# Patient Record
Sex: Female | Born: 1945
Health system: Southern US, Community
[De-identification: ages and names within clinical notes are randomized; demographics above are authoritative.]

## PROBLEM LIST (undated history)

## (undated) DIAGNOSIS — K219 Gastro-esophageal reflux disease without esophagitis: Secondary | ICD-10-CM

## (undated) DIAGNOSIS — E079 Disorder of thyroid, unspecified: Secondary | ICD-10-CM

## (undated) DIAGNOSIS — H269 Unspecified cataract: Secondary | ICD-10-CM

## (undated) DIAGNOSIS — D126 Benign neoplasm of colon, unspecified: Secondary | ICD-10-CM

## (undated) DIAGNOSIS — C801 Malignant (primary) neoplasm, unspecified: Secondary | ICD-10-CM

## (undated) DIAGNOSIS — I10 Essential (primary) hypertension: Secondary | ICD-10-CM

## (undated) DIAGNOSIS — R519 Headache, unspecified: Secondary | ICD-10-CM

## (undated) DIAGNOSIS — G4730 Sleep apnea, unspecified: Secondary | ICD-10-CM

## (undated) DIAGNOSIS — T7840XA Allergy, unspecified, initial encounter: Secondary | ICD-10-CM

## (undated) DIAGNOSIS — IMO0002 Reserved for concepts with insufficient information to code with codable children: Secondary | ICD-10-CM

## (undated) DIAGNOSIS — D219 Benign neoplasm of connective and other soft tissue, unspecified: Secondary | ICD-10-CM

## (undated) DIAGNOSIS — M1990 Unspecified osteoarthritis, unspecified site: Secondary | ICD-10-CM

## (undated) DIAGNOSIS — M79609 Pain in unspecified limb: Secondary | ICD-10-CM

## (undated) DIAGNOSIS — A048 Other specified bacterial intestinal infections: Secondary | ICD-10-CM

## (undated) DIAGNOSIS — K449 Diaphragmatic hernia without obstruction or gangrene: Secondary | ICD-10-CM

## (undated) DIAGNOSIS — R011 Cardiac murmur, unspecified: Secondary | ICD-10-CM

## (undated) DIAGNOSIS — I8390 Asymptomatic varicose veins of unspecified lower extremity: Secondary | ICD-10-CM

## (undated) DIAGNOSIS — F419 Anxiety disorder, unspecified: Secondary | ICD-10-CM

## (undated) DIAGNOSIS — R51 Headache: Secondary | ICD-10-CM

## (undated) DIAGNOSIS — G4733 Obstructive sleep apnea (adult) (pediatric): Secondary | ICD-10-CM

## (undated) HISTORY — DX: Anxiety disorder, unspecified: F41.9

## (undated) HISTORY — PX: UPPER GASTROINTESTINAL ENDOSCOPY: SHX188

## (undated) HISTORY — DX: Reserved for concepts with insufficient information to code with codable children: IMO0002

## (undated) HISTORY — DX: Benign neoplasm of connective and other soft tissue, unspecified: D21.9

## (undated) HISTORY — DX: Headache, unspecified: R51.9

## (undated) HISTORY — DX: Benign neoplasm of colon, unspecified: D12.6

## (undated) HISTORY — DX: Unspecified cataract: H26.9

## (undated) HISTORY — DX: Asymptomatic varicose veins of unspecified lower extremity: I83.90

## (undated) HISTORY — DX: Gastro-esophageal reflux disease without esophagitis: K21.9

## (undated) HISTORY — PX: ESOPHAGOGASTRODUODENOSCOPY: SHX1529

## (undated) HISTORY — DX: Disorder of thyroid, unspecified: E07.9

## (undated) HISTORY — DX: Essential (primary) hypertension: I10

## (undated) HISTORY — DX: Other specified bacterial intestinal infections: A04.8

## (undated) HISTORY — DX: Allergy, unspecified, initial encounter: T78.40XA

## (undated) HISTORY — DX: Headache: R51

## (undated) HISTORY — DX: Unspecified osteoarthritis, unspecified site: M19.90

## (undated) HISTORY — DX: Cardiac murmur, unspecified: R01.1

## (undated) HISTORY — PX: BREAST SURGERY: SHX581

## (undated) HISTORY — DX: Sleep apnea, unspecified: G47.30

## (undated) HISTORY — DX: Obstructive sleep apnea (adult) (pediatric): G47.33

## (undated) HISTORY — PX: COLONOSCOPY: SHX174

## (undated) HISTORY — DX: Malignant (primary) neoplasm, unspecified: C80.1

## (undated) HISTORY — DX: Diaphragmatic hernia without obstruction or gangrene: K44.9

## (undated) HISTORY — DX: Pain in unspecified limb: M79.609

## (undated) HISTORY — PX: TONSILECTOMY, ADENOIDECTOMY, BILATERAL MYRINGOTOMY AND TUBES: SHX2538

---

## 1998-04-12 ENCOUNTER — Other Ambulatory Visit: Admission: RE | Admit: 1998-04-12 | Discharge: 1998-04-12 | Payer: Self-pay | Admitting: *Deleted

## 1998-04-19 ENCOUNTER — Ambulatory Visit (HOSPITAL_COMMUNITY): Admission: RE | Admit: 1998-04-19 | Discharge: 1998-04-19 | Payer: Self-pay | Admitting: *Deleted

## 1998-05-16 ENCOUNTER — Ambulatory Visit (HOSPITAL_COMMUNITY): Admission: RE | Admit: 1998-05-16 | Discharge: 1998-05-16 | Payer: Self-pay | Admitting: *Deleted

## 1998-05-23 ENCOUNTER — Ambulatory Visit (HOSPITAL_COMMUNITY): Admission: RE | Admit: 1998-05-23 | Discharge: 1998-05-23 | Payer: Self-pay | Admitting: *Deleted

## 1999-08-21 ENCOUNTER — Other Ambulatory Visit: Admission: RE | Admit: 1999-08-21 | Discharge: 1999-08-21 | Payer: Self-pay | Admitting: *Deleted

## 1999-08-23 ENCOUNTER — Ambulatory Visit (HOSPITAL_COMMUNITY): Admission: RE | Admit: 1999-08-23 | Discharge: 1999-08-23 | Payer: Self-pay | Admitting: *Deleted

## 2001-02-10 ENCOUNTER — Encounter: Payer: Self-pay | Admitting: Emergency Medicine

## 2001-02-10 ENCOUNTER — Emergency Department (HOSPITAL_COMMUNITY): Admission: EM | Admit: 2001-02-10 | Discharge: 2001-02-10 | Payer: Self-pay | Admitting: Emergency Medicine

## 2001-05-12 ENCOUNTER — Encounter: Payer: Self-pay | Admitting: Endocrinology

## 2001-05-12 ENCOUNTER — Encounter (INDEPENDENT_AMBULATORY_CARE_PROVIDER_SITE_OTHER): Payer: Self-pay | Admitting: Specialist

## 2001-05-12 ENCOUNTER — Ambulatory Visit (HOSPITAL_COMMUNITY): Admission: RE | Admit: 2001-05-12 | Discharge: 2001-05-12 | Payer: Self-pay | Admitting: Endocrinology

## 2001-06-23 ENCOUNTER — Other Ambulatory Visit: Admission: RE | Admit: 2001-06-23 | Discharge: 2001-06-23 | Payer: Self-pay | Admitting: *Deleted

## 2001-07-21 ENCOUNTER — Ambulatory Visit (HOSPITAL_COMMUNITY): Admission: RE | Admit: 2001-07-21 | Discharge: 2001-07-21 | Payer: Self-pay | Admitting: *Deleted

## 2002-05-04 ENCOUNTER — Encounter: Payer: Self-pay | Admitting: Endocrinology

## 2002-05-04 ENCOUNTER — Ambulatory Visit (HOSPITAL_COMMUNITY): Admission: RE | Admit: 2002-05-04 | Discharge: 2002-05-04 | Payer: Self-pay | Admitting: Endocrinology

## 2002-08-10 ENCOUNTER — Other Ambulatory Visit: Admission: RE | Admit: 2002-08-10 | Discharge: 2002-08-10 | Payer: Self-pay | Admitting: *Deleted

## 2004-06-20 ENCOUNTER — Encounter: Admission: RE | Admit: 2004-06-20 | Discharge: 2004-06-20 | Payer: Self-pay | Admitting: Internal Medicine

## 2004-10-29 ENCOUNTER — Encounter: Admission: RE | Admit: 2004-10-29 | Discharge: 2004-10-29 | Payer: Self-pay | Admitting: Neurosurgery

## 2006-10-28 ENCOUNTER — Ambulatory Visit (HOSPITAL_COMMUNITY): Admission: RE | Admit: 2006-10-28 | Discharge: 2006-10-28 | Payer: Self-pay | Admitting: Endocrinology

## 2006-11-18 ENCOUNTER — Encounter (INDEPENDENT_AMBULATORY_CARE_PROVIDER_SITE_OTHER): Payer: Self-pay | Admitting: Specialist

## 2006-11-18 ENCOUNTER — Ambulatory Visit (HOSPITAL_COMMUNITY): Admission: RE | Admit: 2006-11-18 | Discharge: 2006-11-18 | Payer: Self-pay | Admitting: Endocrinology

## 2006-12-30 ENCOUNTER — Encounter (INDEPENDENT_AMBULATORY_CARE_PROVIDER_SITE_OTHER): Payer: Self-pay | Admitting: Specialist

## 2006-12-30 ENCOUNTER — Ambulatory Visit (HOSPITAL_COMMUNITY): Admission: RE | Admit: 2006-12-30 | Discharge: 2006-12-30 | Payer: Self-pay | Admitting: *Deleted

## 2007-03-24 ENCOUNTER — Ambulatory Visit (HOSPITAL_COMMUNITY): Admission: RE | Admit: 2007-03-24 | Discharge: 2007-03-24 | Payer: Self-pay | Admitting: *Deleted

## 2008-11-15 ENCOUNTER — Other Ambulatory Visit: Admission: RE | Admit: 2008-11-15 | Discharge: 2008-11-15 | Payer: Self-pay | Admitting: Obstetrics and Gynecology

## 2009-08-22 ENCOUNTER — Encounter: Admission: RE | Admit: 2009-08-22 | Discharge: 2009-08-22 | Payer: Self-pay | Admitting: Family Medicine

## 2010-07-03 ENCOUNTER — Ambulatory Visit: Payer: Self-pay | Admitting: Vascular Surgery

## 2010-10-16 ENCOUNTER — Ambulatory Visit: Payer: Self-pay | Admitting: Vascular Surgery

## 2010-12-17 ENCOUNTER — Encounter: Payer: Self-pay | Admitting: Endocrinology

## 2011-01-09 ENCOUNTER — Other Ambulatory Visit: Payer: Self-pay | Admitting: Gastroenterology

## 2011-01-09 DIAGNOSIS — R112 Nausea with vomiting, unspecified: Secondary | ICD-10-CM

## 2011-01-22 ENCOUNTER — Ambulatory Visit
Admission: RE | Admit: 2011-01-22 | Discharge: 2011-01-22 | Disposition: A | Discharge status: Home / Self Care | Payer: BC Managed Care – PPO | Source: Ambulatory Visit | Attending: Gastroenterology | Admitting: Gastroenterology

## 2011-01-22 DIAGNOSIS — R112 Nausea with vomiting, unspecified: Secondary | ICD-10-CM

## 2011-04-10 NOTE — Consult Note (Signed)
NEW PATIENT OCTOBER, PEERY N  DOB:  Apr 09, 1946                                       07/03/2010  VHQIO#:[id number]   Patient is a 65 year old female with venous insufficiency of both lower  extremities, right worse than left.  She has been suffering from  increasing discomfort, particularly in the distal thigh and posterior  popliteal area, right worse than left.  She is a hair stylist and stands  all day.  These symptoms become worse as the day progresses.  She has no  history of DVT or thrombophlebitis.  She has tried to wear elastic  stockings in the past but has never been able to find any that sit well.  Elevation of the legs does help her symptoms, but she is unable to do  that during her long work day.  She does take pain medication without  much success.  She also has bad arthritis of her knee and may require  knee surgery in the future.  She has no history of stasis ulcers,  bleeding, or other complications but does have increasing swelling in  both ankles as the day progresses.   CHRONIC MEDICAL PROBLEMS:  1. Hypothyroidism.  2. Allergic rhinitis.  3. Mild mitral regurgitation.  4. GERD.  5. History of uterine fibroids.  6. Negative for coronary artery disease, diabetes, COPD, or stroke.   SOCIAL HISTORY:  The patient is single.  She has 1 child.  She is a hair  stylist.  Does not use tobacco or alcohol.   FAMILY HISTORY:  Negative for coronary artery disease, diabetes, or  stroke.   REVIEW OF SYSTEMS:  Does have discomfort in both legs due to arthritis  in her knees and has a history of headaches, reflux esophagitis.  No  chest pain, dyspnea on exertion, productive cough, bronchitis.  All  other systems are negative in review of systems.   PHYSICAL EXAMINATION:  Blood pressure 140/70, heart rate 72,  respirations 24.  Generally, she is a well-developed, well-nourished  female in no apparent stress.  Alert and oriented x3.   HEENT:  Normal  for age.  EOMs intact.  Lungs are clear to auscultation.  No rhonchi or  wheezing.  Cardiovascular:  Regular rhythm.  No murmurs.  Abdomen:  Soft, nontender with no masses.  Carotid pulses 3+.  No audible bruits.  Musculoskeletal exam is free of major deformities.  Neurologic:  Normal.  Skin is free of rashes.  Lower extremity exam reveals 3+ femoral,  popliteal, and dorsalis pedis pulses, palpable bilaterally.  She has 1+  edema bilaterally.  She has diffuse spider veins in both legs in the  distal thigh and medial calf, anterior thigh, and ankle areas.  She has  bulging varicosities in the right leg over the great saphenous system of  the medial thigh and medial calf area.  She has less varicosities on the  left side.  Popliteal fossa has a large area of reticular and spider  veins bilaterally.   Today I ordered a venous duplex exam of both lower extremities, which I  reviewed and interpreted.  Deep systems are normal bilaterally.  Right  great saphenous vein has gross reflux throughout, including the  saphenofemoral junction and the right small saphenous vein also has  reflux.  The left small saphenous vein has reflux, but  the great  saphenous vein is competent in the thigh with some reflux in the calf.   I think this patient's symptoms of burning, itching, throbbing and  swelling are coming from her superficial venous insufficiency secondary  to valvular incompetence.  We will treat her with long-leg elastic  compression stockings (20 mm - 30 mm gradient) as well as elevation and  ibuprofen for 3 months.  When she returns, if there has been no  improvement, I think she should have laser ablation of the right great  saphenous vein followed by laser ablation of the right small saphenous  vein to relieve her symptoms on that side.  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     Quita Skye. Hart Rochester, M.D.  Electronically Signed    JDL/MEDQ  D:  07/03/2010  T:  07/03/2010  Job:  [id number]   cc:   Caryn Bee L. Little, M.D.  Dr. Altamese Dilling

## 2011-04-10 NOTE — Procedures (Signed)
LOWER EXTREMITY VENOUS REFLUX EXAM   INDICATION:  Pain in the legs, varicose veins.   EXAM:  Using color-flow imaging and pulse Doppler spectral analysis, the  bilateral common femoral, superficial femoral, popliteal, posterior  tibial, greater and lesser saphenous veins are evaluated.  There is  evidence suggesting deep venous insufficiency in the left lower  extremity.   The right saphenofemoral junction is not competent with Reflux of  >538milliseconds. The bilateral GSV's are not competent with Reflux of  >560milliseconds with the caliber as described below.  Reflux noted in  the left greater saphenous vein only below the knee.   The bilateral proximal short saphenous veins demonstrate competency.   GSV Diameter (used if found to be incompetent only)                                            Right         Left  Proximal Greater Saphenous Vein           0.65 cm       cm  Proximal-to-mid-thigh                     cm            cm  Mid thigh                                 0.54 cm       cm  Mid-distal thigh                          cm            cm  Distal thigh                              0.41 cm       cm  Knee                                      0.40 cm       cm   IMPRESSION:  1. The right greater saphenous vein is identified with the caliber      ranging from 0.40 cm to 0.72 cm knee to groin.  2. The bilateral greater saphenous veins are not aneurysmal.  3. The bilateral greater saphenous veins are not tortuous.  4. The deep venous system is not competent with Reflux of      >518milliseconds in the left leg.  5. The bilateral lesser saphenous veins are not competent with Reflux      of >519milliseconds.  6. There is an nonvascularized structure noted behind the right knee      measuring 1.79 cm X 1.54 cm.   ___________________________________________  Quita Skye. Hart Rochester, M.D.   CB/MEDQ  D:  07/03/2010  T:  07/03/2010  Job:  [id number]

## 2011-04-10 NOTE — Assessment & Plan Note (Signed)
OFFICE VISIT   Sherry Bell, Sherry Bell  DOB:  11-13-1946                                       10/16/2010  UJWJX#:[id number]   The patient returns today for continued followup regarding her venous  insufficiency of both lower extremities, right worse than left.  She  continues to have aching, throbbing and burning discomfort in both legs  which worsens as the day progresses.  She is a Scientist, research (medical) and stands  all day and her symptoms are becoming intolerable.  She has no history  of DVT or thrombophlebitis.  She has tried wearing elastic compression  stockings as much as possible and has had no improvement in her symptoms  despite wearing long leg (20 mm-30 mm gradient) stockings.  She cannot  elevate her legs at work but does try ibuprofen.   She does have documented reflux in the right great saphenous vein from  the saphenofemoral junction to the knee and in both small saphenous  veins from the mid calf to the saphenopopliteal junction.   On exam she does have multiple reticular and spider veins throughout  both legs, particularly in the posterior popliteal area and down in the  medial ankle area.  There are some bulging varicosities in the right  distal thigh.  She does have one to two plus edema bilaterally, right  worse than left.   I think she should have the following procedures performed:  1) laser  ablation of the right great saphenous vein, 2) laser ablation of the  right small saphenous vein and 3) laser ablation of the left small  saphenous vein.  We will proceed with precertification to perform this  in the near future.     Quita Skye Hart Rochester, M.D.  Electronically Signed   JDL/MEDQ  D:  10/16/2010  T:  10/16/2010  Job:  [id number]

## 2011-04-13 NOTE — Op Note (Signed)
NAMELIZZET, HENDLEY               ACCOUNT NO.:  [id number]   MEDICAL RECORD NO.:  [id number]          PATIENT TYPE:  AMB   LOCATION:  ENDO                         FACILITY:  [facility]   PHYSICIAN:  Georgiana Spinner, M.D.    DATE OF BIRTH:  01/15/1946   DATE OF PROCEDURE:  12/30/2006  DATE OF DISCHARGE:                               OPERATIVE REPORT   PROCEDURE:  Upper endoscopy.   INDICATIONS:  Abdominal pain, hemoccult positivity.   ANESTHESIA:  Demerol 60 mg, Versed 6 mg.   PROCEDURE:  With the patient mildly sedated in the left lateral  decubitus position, the Pentax videoscopic endoscope was inserted in the  mouth, passed under direct vision through the esophagus which showed a  slight area of esophagitis with a linear ulcer noted, photographed and  biopsied.  We entered into the stomach.  The fundus, body, antrum,  duodenal bulb, second portion duodenum were visualized.  From this point  the endoscope was slowly withdrawn taking circumferential views of  duodenal mucosa until the endoscope had been pulled back into the  stomach, placed in retroflexion to view the stomach from below.  The  endoscope was straightened and withdrawn taking circumferential views of  the remaining gastric and esophageal mucosa stopping in the fundus of  the stomach where a snake skinning appearance was noted and was  biopsied.  The endoscope was withdrawn.  The patient's vital signs,  pulse oximeter remained stable.  The patient tolerated procedure well  without apparent complications.   FINDINGS:  A snake skinning of fundus of stomach and some mild degree of  esophagitis with a short linear ulcer seen.   PLAN:  Await biopsy report.  The patient will call me for results and  follow-up with me as an outpatient.  Proceed to colonoscopy as planned.           ______________________________  Georgiana Spinner, M.D.     GMO/MEDQ  D:  12/30/2006  T:  12/30/2006  Job:  [id number]   cc:   Caryn Bee L. Little,  M.D.

## 2011-04-13 NOTE — Op Note (Signed)
NAMELACHE, DAGHER               ACCOUNT NO.:  [id number]   MEDICAL RECORD NO.:  [id number]          PATIENT TYPE:  AMB   LOCATION:  ENDO                         FACILITY:  [facility]   PHYSICIAN:  Georgiana Spinner, M.D.    DATE OF BIRTH:  June 25, 1946   DATE OF PROCEDURE:  12/30/2006  DATE OF DISCHARGE:                               OPERATIVE REPORT   SURGEON:  Georgiana Spinner, M.D.   PROCEDURE:  Colonoscopy.   INDICATIONS:  Hemoccult positivity.   ANESTHESIA:  Demerol 40 mg, Versed 4 mg, Phenergan 6.25 mg.   DESCRIPTION OF PROCEDURE:  With the patient mildly sedated in the left  lateral decubitus position, a rectal exam was performed, which revealed  trace positive material.  Subsequently, the Pentax videoscopic  colonoscope was inserted into the rectum and passed under direct vision  to the cecum, identified by the ileocecal valve and appendiceal orifice,  both of which were photographed.  From this point, the colonoscope was  slowly withdrawn, taking circumferential views of colonic mucosa,  stopping only in the rectum, which appeared normal, with the rectum on  retroflex view.  The endoscope was straightened and withdrawn.  The  patient's vital signs and pulse oximetry remained stable.  The patient  tolerated the procedure well and without apparent complications.   FINDINGS:  Negative examination.   PLAN:  See endoscopy note for further details.           ______________________________  Georgiana Spinner, M.D.     GMO/MEDQ  D:  12/30/2006  T:  12/30/2006  Job:  [id number]   cc:   Caryn Bee L. Little, M.D.

## 2011-07-09 ENCOUNTER — Encounter: Payer: Self-pay | Admitting: Vascular Surgery

## 2011-07-12 ENCOUNTER — Encounter: Payer: Self-pay | Admitting: Vascular Surgery

## 2011-07-23 ENCOUNTER — Other Ambulatory Visit: Payer: BC Managed Care – PPO | Admitting: Vascular Surgery

## 2011-07-31 ENCOUNTER — Ambulatory Visit: Payer: BC Managed Care – PPO | Admitting: Vascular Surgery

## 2011-08-03 ENCOUNTER — Encounter: Payer: Self-pay | Admitting: Vascular Surgery

## 2011-08-06 ENCOUNTER — Ambulatory Visit (INDEPENDENT_AMBULATORY_CARE_PROVIDER_SITE_OTHER): Payer: BC Managed Care – PPO | Admitting: Vascular Surgery

## 2011-08-06 VITALS — BP 180/87 | HR 72 | Resp 24 | Ht 63.5 in | Wt 200.0 lb

## 2011-08-06 DIAGNOSIS — I83893 Varicose veins of bilateral lower extremities with other complications: Secondary | ICD-10-CM

## 2011-08-06 NOTE — Progress Notes (Deleted)
Subjective:     Patient ID: Sherry Bell, female   DOB: [birth year], 65 y.o.   MRN: [id number]  HPI   Review of Systems     Objective:   Physical Exam     Assessment:     ***    Plan:     ***

## 2011-08-06 NOTE — Progress Notes (Deleted)
Subjective:     Patient ID: Sherry Bell, female   DOB: 1946-09-09, 65 y.o.   MRN: [id number]  HPI  Laser Ablation Procedure      Date: 08/06/2011    Sherry Bell DOB:March 14, 1946  Consent signed: yes  Surgeon:{VVS DOCTORS:[id number]}  Procedure: Laser Ablation: right greater Saphenous Vein  BP 180/87  Pulse 72  Resp 24  Ht 5' 3.5" (1.613 m)  Wt 200 lb (90.719 kg)  BMI 34.87 kg/m2  Start time: 9:10   End time: 10:00  Tumescent Anesthesia: 8 cc 0.9% NaCl with 50 cc Lidocaine HCL with 1% Epi and 15 cc 8.4% NaHCO3  Local Anesthesia: 275 cc Lidocaine HCL and NaHCO3 (ratio 2:1)  15 watt/1 sec/1 pulse      Patient tolerated procedure well: {yes/no:20286}   Description of Procedure:  After marking the course of the saphenous vein and the secondary varicosities in the standing position, the patient was placed on the operating table in the {DESC; PRONE / SUPINE / LATERAL:19389} position, and  was prepped and draped in sterile fashion. Local anesthetic was administered, and under ultrasound guidance the saphenous vein was accessed with a micro needle and guide wire; then the micro puncture sheath was placed. A guide wire was inserted to the right SFJ junction, followed by a 5 french sheath.  The position of the sheath and then the laser fiber below the junction was confirmed using the ultrasound and visualization of the aiming beam.  Tumescent anesthesia was administered along the course of the saphenous vein using ultrasound guidance. Protective laser glasses were placed on the patient, and the laser was fired at Pulsed mode: 15 watts, 1 sec., for 57 secs.  for a total of 866 joules.  A steri strip was applied to the puncture site.  The patient was then put into Trendelenburg position.  Local anesthetic was utilized overlying the marked varicosities.  Greater than *** stab wounds were made using the tip of an 11 blade; and using the vein hook,  The phlebectomies were performed using a  hemostat to avulse these varicosities.  Adequate hemostasis was achieved, and steri strips were applied to the stab wound.      ABD pads and thigh high compression stockings were applied.  The patient ambulated out of the operating room having tolerated the procedure well.    Review of Systems     Objective:   Physical Exam     Assessment:     ***    Plan:     ***

## 2011-08-06 NOTE — Progress Notes (Signed)
Subjective:     Patient ID: Sherry Bell, female   DOB: 09-17-1946, 65 y.o.   MRN: [id number]  HPI this 65 year old female underwent laser ablation of the right great saphenous sign under local tumescent anesthesia. She tolerated the procedure well. She had documented reflux in the right great saphenous system from the distal thigh to the saphenofemoral junction due to a valvular incompetence. This was causing pain and swelling in the right leg. She will return in one week for a venous duplex exam to confirm closure of the right great saphenous vein. She will then be scheduled for similar procedures in both small saphenous veins which also have gross reflux.   Review of Systems     Objective:   Physical Exam blood pressure today 180/87 heart rate 70 respirations 20    Assessment:     Well-tolerated laser ablation of right great saphenous system and local tumescent anesthesia    Plan:     Return in one week for venous duplex exam to confirm closure.

## 2011-08-07 ENCOUNTER — Telehealth: Payer: Self-pay | Admitting: *Deleted

## 2011-08-07 NOTE — Telephone Encounter (Signed)
Called patient morning after her procedure: R GSV laser ablation. Patient is doing well, no problems, following all instructions. Will see her next Tuesday for fu lab study and Dr. Visit. She will call if she has any problems or concerns.

## 2011-08-13 ENCOUNTER — Encounter: Payer: Self-pay | Admitting: Vascular Surgery

## 2011-08-14 ENCOUNTER — Ambulatory Visit (INDEPENDENT_AMBULATORY_CARE_PROVIDER_SITE_OTHER): Payer: BC Managed Care – PPO | Admitting: Vascular Surgery

## 2011-08-14 ENCOUNTER — Encounter: Payer: Self-pay | Admitting: Vascular Surgery

## 2011-08-14 ENCOUNTER — Encounter (INDEPENDENT_AMBULATORY_CARE_PROVIDER_SITE_OTHER): Payer: BC Managed Care – PPO | Admitting: *Deleted

## 2011-08-14 VITALS — BP 173/77 | HR 83 | Resp 20 | Ht 65.5 in | Wt 200.0 lb

## 2011-08-14 DIAGNOSIS — I83893 Varicose veins of bilateral lower extremities with other complications: Secondary | ICD-10-CM

## 2011-08-14 DIAGNOSIS — Z48812 Encounter for surgical aftercare following surgery on the circulatory system: Secondary | ICD-10-CM

## 2011-08-14 NOTE — Progress Notes (Signed)
One week fu laser.

## 2011-08-14 NOTE — Progress Notes (Signed)
Subjective:     Patient ID: Sherry Bell, female   DOB: [birth year], 65 y.o.   MRN: [id number]  HPI this 65 year old female returns today for initial followup regarding her laser ablation of the right great saphenous vein performed one week ago. She had some burning discomfort along the anterior aspect of the right lower leg which lasted a few days and then resolved she has had no progression of her edema. She denies any severe discomfort along the course of the ablation in the right medial thigh. She has been wearing her elastic compression stocking and taking ibuprofen as prescribed.  Past Medical History  Diagnosis Date  . Gastric reflux   . Arthritis   . Generalized headaches   . Pain in limb   . Thyroid disease   . Allergy   . Heart murmur     History  Substance Use Topics  . Smoking status: Never Smoker   . Smokeless tobacco: Not on file  . Alcohol Use: No    No family history on file.  Allergies  Allergen Reactions  . Codeine     No current outpatient prescriptions on file.  BP 173/77  Pulse 83  Resp 20  Ht 5' 5.5" (1.664 m)  Wt 200 lb (90.719 kg)  BMI 32.78 kg/m2  Body mass index is 32.78 kg/(m^2).        Review of Systems she denies chest pain dyspnea on exertion PND orthopnea hemoptysis or new claudication symptoms     Objective:   Physical Exam blood pressure 173/77 heart rate 83 respirations 20 General she is a well-developed well-nourished female in no apparent stress alert and oriented x3 Right lower extremity exam reveals 3+ femoral and dorsalis pedis pulse palpable. She has some mild discomfort along the course of the great saphenous vein from the mid thigh to the saphenofemoral junction. There are diffuse bilateral reticular veins on the lateral thigh lateral calf area. 1+ edema distally.  Today I ordered a venous duplex exam of the right leg which reveals no evidence of DVT and total closure of the right great saphenous vein.    Assessment:       Doing well post laser ablation right great saphenous I    Plan:     Laser ablation of right small saphenous spine scheduled for October 1

## 2011-08-22 NOTE — Procedures (Unsigned)
DUPLEX DEEP VENOUS EXAM - LOWER EXTREMITY  INDICATION:  One week followup right GSV ablation.  HISTORY:  Edema:  Yes. Trauma/Surgery:  EVLT. Pain:  No. PE:  No. Previous DVT:  No. Anticoagulants:  No. Other:  DUPLEX EXAM:               CFV   SFV   PopV  PTV    GSV               R  L  R  L  R  L  R   L  R  L Thrombosis    o     o     o     o      + Spontaneous   +     +     +     +      o Phasic        +     +     +     +      o Augmentation  +     +     +     +      o Compressible  +     +     +     +      o Competent  Legend:  + - yes  o - no  p - partial  D - decreased  IMPRESSION: 1. No evidence of deep venous thrombosis in the right lower extremity     status post GSV (great saphenous vein) ablation. 2. Right great saphenous vein appears successfully ablated from 1.14     cm distal to the junction through the mid thigh insertion point.    _____________________________ Quita Skye. Hart [place], M.D.  LT/MEDQ  D:  08/14/2011  T:  08/14/2011  Job:  [id number]

## 2011-08-24 ENCOUNTER — Encounter: Payer: Self-pay | Admitting: Vascular Surgery

## 2011-08-27 ENCOUNTER — Encounter: Payer: Self-pay | Admitting: Vascular Surgery

## 2011-08-27 ENCOUNTER — Ambulatory Visit (INDEPENDENT_AMBULATORY_CARE_PROVIDER_SITE_OTHER): Payer: BC Managed Care – PPO | Admitting: Vascular Surgery

## 2011-08-27 VITALS — BP 175/70 | HR 72 | Resp 20 | Ht 63.5 in | Wt 200.0 lb

## 2011-08-27 DIAGNOSIS — I83893 Varicose veins of bilateral lower extremities with other complications: Secondary | ICD-10-CM

## 2011-08-27 NOTE — Progress Notes (Signed)
Subjective:     Patient ID: Sherry Bell, female   DOB: 1946-09-16, 65 y.o.   MRN: [id number]  HPI this 65 year old female presented today for laser ablation of the right small saphenous vein area he continued to have aching throbbing and burning discomfort in both legs as the day progresses. She is a Scientist, research (medical) and stands all day. Previous reflux exam dated August 20 11th had revealed that she did have gross reflux in both small saphenous veins as well as the right great saphenous vein. She has had successful laser of elation of the right great saphenous vein. Prior to beginning later today I am it is both small saphenous veins with the SonoSite scanner. She does not appear to have any reflux and a small saphenous veins today on either the right or left side. The caliber of the veins was also diminished significantly since August of 2011. I do not recommend proceeding with laser ablation of the small saphenous vein and I discussed this with the patient.   Review of Systems blood pressure 175/70 heart rate 72 respirations 20     Objective:   Physical Exam     Assessment:     No obvious reflux in bilateral small saphenous veins at this time    Plan:    #1 short-leg elastic compression stockings #2 elevate legs when necessary #3 no further treatment indicated at this time

## 2011-08-27 NOTE — Progress Notes (Signed)
[id number] R SSV

## 2011-09-04 ENCOUNTER — Ambulatory Visit: Payer: BC Managed Care – PPO | Admitting: Vascular Surgery

## 2011-09-04 ENCOUNTER — Other Ambulatory Visit: Payer: BC Managed Care – PPO

## 2011-11-02 ENCOUNTER — Encounter: Payer: Self-pay | Admitting: Vascular Surgery

## 2014-01-16 ENCOUNTER — Ambulatory Visit (INDEPENDENT_AMBULATORY_CARE_PROVIDER_SITE_OTHER): Payer: Medicare Other | Admitting: Emergency Medicine

## 2014-01-16 VITALS — BP 144/76 | HR 77 | Temp 98.5°F | Resp 18 | Ht 62.5 in | Wt 221.0 lb

## 2014-01-16 DIAGNOSIS — R109 Unspecified abdominal pain: Secondary | ICD-10-CM

## 2014-01-16 LAB — POCT URINALYSIS DIPSTICK
Bilirubin, UA: NEGATIVE
Glucose, UA: NEGATIVE
Ketones, UA: NEGATIVE
Nitrite, UA: NEGATIVE
Protein, UA: NEGATIVE
Spec Grav, UA: 1.015
Urobilinogen, UA: 0.2
pH, UA: 8

## 2014-01-16 LAB — POCT UA - MICROSCOPIC ONLY
Bacteria, U Microscopic: NEGATIVE
Casts, Ur, LPF, POC: NEGATIVE
Crystals, Ur, HPF, POC: NEGATIVE
Mucus, UA: NEGATIVE
RBC, urine, microscopic: NEGATIVE
Yeast, UA: NEGATIVE

## 2014-01-16 NOTE — Progress Notes (Signed)
[facility] [address] [place] [address] [phone]  Date:  01/16/2014   Name:  Sherry Bell   DOB:  04-11-1946   MRN:  [id number]  PCP:  Gennette Pac, MD    Chief Complaint: Back Pain   History of Present Illness:  Sherry Bell is a 68 y.o. very pleasant female patient who presents with the following:  Says she has pain in right flank since Christmas.  Pain waxes and wanes and is often completely gone for days.  No dysuria, urgency or frequency.  No hematuria.  No nausea or vomiting no stool change.  No vaginal discharge or bleeding.  No dyspareunia.  Says the pain interferes with activities.  Feels as though she has a ball in her back.  No fever or chills.  No improvement with over the counter medications or other home remedies. Denies other complaint or health concern today.   There are no active problems to display for this patient.   Past Medical History  Diagnosis Date  . Gastric reflux   . Arthritis   . Generalized headaches   . Pain in limb   . Thyroid disease   . Allergy   . Heart murmur     Past Surgical History  Procedure Laterality Date  . Tonsilectomy, adenoidectomy, bilateral myringotomy and tubes      History  Substance Use Topics  . Smoking status: Never Smoker   . Smokeless tobacco: Not on file  . Alcohol Use: No    No family history on file.  Allergies  Allergen Reactions  . Codeine     Medication list has been reviewed and updated.  No current outpatient prescriptions on file prior to visit.   No current facility-administered medications on file prior to visit.    Review of Systems:  As per HPI, otherwise negative.    Physical Examination: Filed Vitals:   01/16/14 1745  BP: 144/76  Pulse: 77  Temp: 98.5 F (36.9 C)  Resp: 18   Filed Vitals:   01/16/14 1745  Height: 5' 2.5" (1.588 m)  Weight: 221 lb (100.245 kg)   Body mass index is 39.75 kg/(m^2). Ideal Body Weight: Weight in  (lb) to have BMI = 25: 138.6  GEN: WDWN, NAD, Non-toxic, A & O x 3 HEENT: Atraumatic, Normocephalic. Neck supple. No masses, No LAD. Ears and Nose: No external deformity. CV: RRR, No M/G/R. No JVD. No thrill. No extra heart sounds. PULM: CTA B, no wheezes, crackles, rhonchi. No retractions. No resp. distress. No accessory muscle use. ABD: S, NT, ND, +BS. No rebound. No HSM. EXTR: No c/c/e NEURO Normal gait.  PSYCH: Normally interactive. Conversant. Not depressed or anxious appearing.  Calm demeanor.    Assessment and Plan: Right flank pain sono  Signed,  Ellison Carwin, MD  Results for orders placed in visit on 01/16/14  POCT URINALYSIS DIPSTICK      Result Value Ref Range   Color, UA yellow     Clarity, UA clear     Glucose, UA neg     Bilirubin, UA neg     Ketones, UA neg     Spec Grav, UA 1.015     Blood, UA tr-intact     pH, UA 8.0     Protein, UA neg     Urobilinogen, UA 0.2     Nitrite, UA neg     Leukocytes, UA small (1+)    POCT UA - MICROSCOPIC ONLY  Result Value Ref Range   WBC, Ur, HPF, POC 0-1     RBC, urine, microscopic neg     Bacteria, U Microscopic neg     Mucus, UA neg     Epithelial cells, urine per micros 0-1     Crystals, Ur, HPF, POC neg     Casts, Ur, LPF, POC neg     Yeast, UA neg

## 2014-01-16 NOTE — Patient Instructions (Signed)

## 2014-01-19 ENCOUNTER — Ambulatory Visit
Admission: RE | Admit: 2014-01-19 | Discharge: 2014-01-19 | Disposition: A | Discharge status: Home / Self Care | Payer: Medicare Other | Source: Ambulatory Visit | Attending: Emergency Medicine | Admitting: Emergency Medicine

## 2014-01-19 DIAGNOSIS — R109 Unspecified abdominal pain: Secondary | ICD-10-CM

## 2014-01-20 NOTE — Addendum Note (Signed)
Addended by: Roselee Culver on: 01/20/2014 09:31 AM   Modules accepted: Orders

## 2014-03-03 ENCOUNTER — Other Ambulatory Visit: Payer: Self-pay | Admitting: Emergency Medicine

## 2014-03-03 ENCOUNTER — Telehealth: Payer: Self-pay

## 2014-03-03 DIAGNOSIS — N852 Hypertrophy of uterus: Secondary | ICD-10-CM

## 2014-03-03 NOTE — Telephone Encounter (Signed)
Pt had a CT abd/pelvis and the results came in through fax (will scan). Called pt and notified her that we are going to be referring her to GYN

## 2014-03-21 ENCOUNTER — Other Ambulatory Visit: Payer: Self-pay | Admitting: Emergency Medicine

## 2014-03-21 DIAGNOSIS — R1900 Intra-abdominal and pelvic swelling, mass and lump, unspecified site: Secondary | ICD-10-CM

## 2014-05-31 ENCOUNTER — Ambulatory Visit: Payer: Medicare Other | Attending: Gynecologic Oncology | Admitting: Gynecologic Oncology

## 2014-05-31 VITALS — BP 146/66 | HR 66 | Temp 98.6°F | Resp 20 | Ht 63.0 in | Wt 219.0 lb

## 2014-05-31 DIAGNOSIS — D259 Leiomyoma of uterus, unspecified: Secondary | ICD-10-CM

## 2014-05-31 DIAGNOSIS — Z780 Asymptomatic menopausal state: Secondary | ICD-10-CM

## 2014-05-31 NOTE — Patient Instructions (Signed)
Please call our office if you have any concerns. Follow up with Dr. Garwin [facility] office

## 2014-06-01 ENCOUNTER — Encounter: Payer: Self-pay | Admitting: Gynecologic Oncology

## 2014-06-01 DIAGNOSIS — D219 Benign neoplasm of connective and other soft tissue, unspecified: Secondary | ICD-10-CM | POA: Insufficient documentation

## 2014-06-01 NOTE — Progress Notes (Signed)
Consult Note: Gyn-Onc  Consult was requested by Dr. Garwin Brothers for the evaluation of Sherry Bell 68 y.o. female with postmenopausal growth in fibroids  CC:  Chief Complaint  Patient presents with  . Fibroids    Assessment/Plan:  Sherry Bell  is a 69 y.o.  year old woman with slowly growing fibroids in a postmenopausal setting. She seems to have had a 1cm growth in the posterior fibroid (from 5-6cm) over a 4 year period. These remain asymptomatic. Her exam findings are reassuring. Given that I have a low suspicion for malignancy based on their appearance I do not feel strongly that MRI need be performed at this time. I do feel that she should have ongoing surveillance (eg annual imaging with [place]). If they demonstrate >50% growth, I would feel that surgical removal (hysterectomy) would be indicated to rule out malignancy. However with subtle change in size over multiple years, in an asymptomatic patient, it is reasonable to assume close followup. We discussed potential concerning symptoms including pelvic pain or pressure, vaginal bleeding, abnormal discharge, change in bladder or bowel habit. She was informed to let Dr Garwin Brothers or myself know sooner if these develop, as they would also prompt additional workup and consideration for surgery.     HPI: Sherry Bell is a 68 year old woman who is seen in consultation at the request of Dr Garwin Brothers for enlarging uterine fibroids in the postmenopausal setting (her LMP was at age 62 and she has not ever used exogenous hormones postmenopausally). Sherry Bell is asymptomatic with no pelvic pain, pressure, vaginal bleeding or constipation. She does report occasional difficulty with initiating micturition which is felt to be a consequence of a large cystocele causing urethral kinking. However, this does not bother her and is not impairing her quality of life. She underwent US imaging of the uterus in March 2011 which showed 3 uterine fibroids, the largest in the  posterior myometrial wall measuring 5cm in largest diameter. On 05/04/14 she had a repeat US which showed subtle growth in the largest fibroid (now 6.3x4.7x5.4cm). She is not motivated to undergo hysterectomy as she is concerned about time off work and would prefer not to do this if not deemed absolutely necessary.   Interval History: She continues to have no symptoms from the fibroid including no vaginal bleeding.  Current Meds:  Outpatient Encounter Prescriptions as of 05/31/2014  Medication Sig  . levothyroxine (SYNTHROID, LEVOTHROID) 88 MCG tablet Take 88 mcg by mouth daily before breakfast.  . liothyronine (CYTOMEL) 25 MCG tablet   . losartan (COZAAR) 50 MG tablet Take 50 mg by mouth daily.  . Multiple Vitamin (MULTI VITAMIN DAILY PO) Take 1 each by mouth.  . [DISCONTINUED] azelastine (ASTELIN) 0.1 % nasal spray     Allergy:  Allergies  Allergen Reactions  . Codeine Nausea And Vomiting    Social Hx:   History   Social History  . Marital Status: Single    Spouse Name: N/A    Number of Children: N/A  . Years of Education: N/A   Occupational History  . Not on file.   Social History Main Topics  . Smoking status: Never Smoker   . Smokeless tobacco: Not on file  . Alcohol Use: No  . Drug Use: No  . Sexual Activity: No   Other Topics Concern  . Not on file   Social History Narrative  . No narrative on file    Past Surgical Hx:  Past Surgical History  Procedure Laterality  Date  . Tonsilectomy, adenoidectomy, bilateral myringotomy and tubes      Past Medical Hx:  Past Medical History  Diagnosis Date  . Gastric reflux   . Arthritis   . Generalized headaches   . Pain in limb   . Thyroid disease   . Allergy   . Heart murmur   . Cystocele   . Fibroids     Past Gynecological History:  No LMP recorded. Patient is postmenopausal.  Family Hx:  Family History  Problem Relation Age of Onset  . Cancer Father   . Cancer Mother     Review of  Systems:  Constitutional  Feels well,   ENT Normal appearing ears and nares bilaterally Skin/Breast  No rash, sores, jaundice, itching, dryness Cardiovascular  No chest pain, shortness of breath, or edema  Pulmonary  No cough or wheeze.  Gastro Intestinal  No nausea, vomitting, or diarrhoea. No bright red blood per rectum, no abdominal pain, change in bowel movement, or constipation.  Genito Urinary  No frequency, urgency, dysuria, no vaginal bleeding Musculo Skeletal  No myalgia, arthralgia, joint swelling or pain  Neurologic  No weakness, numbness, change in gait,  Psychology  No depression, anxiety, insomnia.   Vitals:  Blood pressure 146/66, pulse 66, temperature 98.6 F (37 C), temperature source Oral, resp. rate 20, height 5\' 3"  (1.6 m), weight 219 lb (99.338 kg).  Physical Exam: WD in NAD Neck  Supple NROM, without any enlargements.  Lymph Node Survey No cervical supraclavicular or inguinal adenopathy Cardiovascular  Pulse normal rate, regularity and rhythm. S1 and S2 normal.  Lungs  Clear to auscultation bilateraly, without wheezes/crackles/rhonchi. Good air movement.  Skin  No rash/lesions/breakdown  Psychiatry  Alert and oriented to person, place, and time  Abdomen  Normoactive bowel sounds, abdomen soft, non-tender and obese without evidence of hernia.  Back No CVA tenderness Genito Urinary  Vulva/vagina: Normal external female genitalia.  No lesions. No discharge or bleeding.  Bladder/urethra:  No lesions or masses, cystocele present. Negative supine stress test.  Vagina: anterior cystocele, otherwise no lesions or masses.  Cervix: Normal appearing, no lesions.  Uterus: Globular, 12 week size, mobile, no parametrial involvement or nodularity.  Adnexa: No masses. Rectal  Good tone, no masses no cul de sac nodularity.  Extremities  No bilateral cyanosis, clubbing or edema.   @[facility] @ 06/01/2014, 11:23 PM

## 2014-06-30 ENCOUNTER — Ambulatory Visit: Payer: Medicare Other

## 2015-02-11 ENCOUNTER — Other Ambulatory Visit: Payer: Self-pay | Admitting: Family Medicine

## 2015-02-11 DIAGNOSIS — R221 Localized swelling, mass and lump, neck: Secondary | ICD-10-CM

## 2015-02-11 LAB — TSH: TSH: 1.99 u[IU]/mL (ref 0.41–5.90)

## 2015-02-15 ENCOUNTER — Ambulatory Visit
Admission: RE | Admit: 2015-02-15 | Discharge: 2015-02-15 | Disposition: A | Discharge status: Home / Self Care | Payer: PPO | Source: Ambulatory Visit | Attending: Family Medicine | Admitting: Family Medicine

## 2015-02-15 DIAGNOSIS — R221 Localized swelling, mass and lump, neck: Secondary | ICD-10-CM

## 2015-03-07 ENCOUNTER — Encounter: Payer: Self-pay | Admitting: Endocrinology

## 2015-03-07 ENCOUNTER — Ambulatory Visit (INDEPENDENT_AMBULATORY_CARE_PROVIDER_SITE_OTHER): Payer: PPO | Admitting: Endocrinology

## 2015-03-07 VITALS — BP 130/72 | HR 73 | Temp 98.0°F | Ht 63.0 in | Wt 222.4 lb

## 2015-03-07 DIAGNOSIS — E038 Other specified hypothyroidism: Secondary | ICD-10-CM | POA: Diagnosis not present

## 2015-03-07 DIAGNOSIS — E041 Nontoxic single thyroid nodule: Secondary | ICD-10-CM | POA: Diagnosis not present

## 2015-03-07 DIAGNOSIS — I10 Essential (primary) hypertension: Secondary | ICD-10-CM | POA: Insufficient documentation

## 2015-03-07 NOTE — Progress Notes (Signed)
Pre visit review using our clinic review tool, if applicable. No additional management support is needed unless otherwise documented below in the visit note. 

## 2015-03-07 NOTE — Progress Notes (Signed)
Patient ID: Sherry Bell, female   DOB: May 14, 1946, 69 y.o.   MRN: [id number]           Reason for Appointment: Evaluation of thyroid nodule    History of Present Illness:   The patient's thyroid nodule was first discovered in early 2016 soon after she had treatment for a respiratory infection in 11/2014 She had felt a swelling on the left side of her neck and was seen by her PCP for evaluation in 3/16 She did not have any swelling prior to that.  Also does not think she had any discomfort when she first found out about it He does not feel that her neck swelling has increased in size since she first found out about it. She has no difficulty swallowing or choking  The thyroid ultrasound showed the following  Dominant simple appearing cyst occupies much of the left lobe and measures approximately 5.2 x 5.4 x 4.6 cm.  This cyst does not have any complex features or mural nodularity.  On review of her previous records it appears that she had a predominantly cystic lesion in the upper pole of her thyroid measuring 1.7 cm in 2003 This was biopsied in 2002 and was benign  Also she had a subcentimeter cyst in the lower left pole of the left side in 2003   HYPOTHYROIDISM:  About 15-20 years ago the patient had gone to her physician because of weight gain. She does not think she had any unusual fatigue at that time  She was found to be hypothyroid and has been on supplementation since then.  She does not think she felt any different with taking the medication but her weight stabilized.  Her previous endocrinologist put her on combination of Synthroid and Cytomel and her dose has been fairly stable for some time She feels she has difficulty losing weight now and does tend to feel tired also Has had recent labs done by PCP   Lab Results  Component Value Date   TSH 1.99 02/11/2015      Medication List       This list is accurate as of: 03/07/15 11:54 AM.  Always use your most recent  med list.               calcium carbonate 600 MG Tabs tablet  Commonly known as:  OS-CAL  Take 600 mg by mouth 2 (two) times daily with a meal.     cholecalciferol 1000 UNITS tablet  Commonly known as:  VITAMIN D  Take 5,000 Units by mouth daily.     levothyroxine 88 MCG tablet  Commonly known as:  SYNTHROID, LEVOTHROID  Take 88 mcg by mouth daily before breakfast.     liothyronine 25 MCG tablet  Commonly known as:  CYTOMEL     losartan 50 MG tablet  Commonly known as:  COZAAR  Take 50 mg by mouth daily.     Magnesium 300 MG Caps  Take by mouth.     OMEGA FATTY ACIDS-VITAMINS PO  Take by mouth.        Allergies:  Allergies  Allergen Reactions  . Codeine Nausea And Vomiting    Past Medical History  Diagnosis Date  . Gastric reflux   . Arthritis   . Generalized headaches   . Pain in limb   . Thyroid disease   . Allergy   . Heart murmur   . Cystocele   . Fibroids     There is no history  of radiation to the neck in childhood  Past Surgical History  Procedure Laterality Date  . Tonsilectomy, adenoidectomy, bilateral myringotomy and tubes      Family History  Problem Relation Age of Onset  . Cancer Father   . Cancer Mother     Social History:  reports that she has never smoked. She has never used smokeless tobacco. She reports that she does not drink alcohol or use illicit drugs.   Review of Systems:  No history of recent weight change, has difficulty losing weight  There is a  history of high blood pressure.             No history of Diabetes.  She had a glucose of 106 fasting in 2010 but subsequently normal, recently 97     GI: No change in bowel habits.  She does complain of reflux  She has had sleep apnea diagnosed in 2013         Examination:   BP 130/72 mmHg  Pulse 73  Temp(Src) 98 F (36.7 C) (Oral)  Ht 5\' 3"  (1.6 m)  Wt 222 lb 6 oz (100.869 kg)  BMI 39.40 kg/m2  SpO2 97%   General Appearance:  she looks well, no pallor         Eyes: No abnormal prominence or swelling of the eyes         THYROID: Thyroid nodule is palpable on the left side and she has about a 4.5-5 cm very soft smooth thyroid nodule extending to near the midline. Right lobe was not palpable  There is no lymphadenopathy in the neck  Heart sounds normal Lungs clear Reflexes at biceps are normal.  Skin: No rash or lesions Extremities: No edema  Assessment/Plan:  Thyroid nodule: She does have a simple cyst on the left side Patient is asymptomatic except for a swelling that she herself had palpated recently She is not having any discomfort or pressure symptoms  Discussed with the patient that since the lesion is entirely cystic without any solid component she does not need a biopsy. Also since she is asymptomatic she does not need any aspiration done Discussed with patient that she likely has a multinodular goiter with a history of previous mostly cystic lesion on the right side also She does have subcentimeter nodules in the right side  She will follow-up in 3 months for reevaluation and she will call if she has any local discomfort or increase in size of the swelling  HYPOTHYROIDISM: Not clear what her baseline labs were prior to her diagnosis but she appears to be doing very well with supplementation using a combination of Synthroid and Cytomel.  Although she would probably do just as well with Synthroid alone she is tolerating both medications well and is compliant with them.  Has stable TSH levels and will continue the same regimen  [facility] 03/07/2015

## 2015-06-06 ENCOUNTER — Telehealth: Payer: Self-pay | Admitting: Endocrinology

## 2015-06-06 ENCOUNTER — Ambulatory Visit: Payer: PPO | Admitting: Endocrinology

## 2015-06-06 NOTE — Telephone Encounter (Signed)
Patient no showed today's appt. Please advise on how to follow up. °A. No follow up necessary. °B. Follow up urgent. Contact patient immediately. °C. Follow up necessary. Contact patient and schedule visit in ___ days. °D. Follow up advised. Contact patient and schedule visit in ____weeks. ° °

## 2015-06-07 ENCOUNTER — Encounter: Payer: Self-pay | Admitting: *Deleted

## 2015-06-07 NOTE — Telephone Encounter (Signed)
Letter mailed

## 2015-06-20 ENCOUNTER — Other Ambulatory Visit: Payer: Self-pay | Admitting: Obstetrics and Gynecology

## 2015-06-20 ENCOUNTER — Other Ambulatory Visit (HOSPITAL_COMMUNITY)
Admission: RE | Admit: 2015-06-20 | Discharge: 2015-06-20 | Disposition: A | Discharge status: Home / Self Care | Payer: PPO | Source: Ambulatory Visit | Attending: Obstetrics and Gynecology | Admitting: Obstetrics and Gynecology

## 2015-06-20 DIAGNOSIS — Z1151 Encounter for screening for human papillomavirus (HPV): Secondary | ICD-10-CM | POA: Diagnosis present

## 2015-06-20 DIAGNOSIS — Z124 Encounter for screening for malignant neoplasm of cervix: Secondary | ICD-10-CM | POA: Insufficient documentation

## 2015-06-21 LAB — CYTOLOGY - PAP

## 2015-08-15 ENCOUNTER — Encounter: Payer: Self-pay | Admitting: Endocrinology

## 2015-08-15 ENCOUNTER — Ambulatory Visit (INDEPENDENT_AMBULATORY_CARE_PROVIDER_SITE_OTHER): Payer: PPO | Admitting: Endocrinology

## 2015-08-15 VITALS — BP 130/86 | HR 63 | Temp 98.6°F | Ht 63.5 in | Wt 223.0 lb

## 2015-08-15 DIAGNOSIS — E038 Other specified hypothyroidism: Secondary | ICD-10-CM | POA: Diagnosis not present

## 2015-08-15 DIAGNOSIS — E041 Nontoxic single thyroid nodule: Secondary | ICD-10-CM

## 2015-08-15 LAB — T4, FREE: Free T4: 0.86 ng/dL (ref 0.60–1.60)

## 2015-08-15 LAB — TSH: TSH: 1.51 u[IU]/mL (ref 0.35–4.50)

## 2015-08-15 LAB — T3, FREE: T3, Free: 2.9 pg/mL (ref 2.3–4.2)

## 2015-08-15 NOTE — Progress Notes (Signed)
Patient ID: Sherry Bell, female   DOB: [birth year], 69 y.o.   MRN: [id number]           Reason for Appointment: Follow-up of hypothyroidism and of thyroid nodule    History of Present Illness:   The patient's thyroid nodule was first discovered in early 2016 soon after she had treatment for a respiratory infection in 11/2014 She had felt a swelling on the left side of her neck and was seen by her PCP for evaluation in 3/16 She did not have any swelling prior to that.  She does not feel that her neck swelling has increased in size and it may have moved more towards the center of the neck  The thyroid ultrasound showed the following Dominant simple appearing cyst occupies much of the left lobe and measures approximately 5.2 x 5.4 x 4.6 cm.  This cyst does not have any complex features or mural nodularity. On review of her previous records she had a predominantly cystic lesion in the upper pole of her thyroid measuring 1.7 cm in 2003 This was biopsied in 2002 and was benign Also she had a subcentimeter cyst in the lower left pole of the left side in 2003   HYPOTHYROIDISM:  About 15-20 years ago the patient had gone to her physician because of weight gain. She does not think she had any unusual fatigue at that time She was found to be hypothyroid and has been on thyroid hormones since then.  She does not think she felt any different with taking the medication but her weight stabilized.  Her previous endocrinologist put her on combination of Synthroid and Cytomel 1/2 bid  and her dose has been fairly stable for some time She did not think she feels unusually tired, no recent labs available   Lab Results  Component Value Date   FREET4 0.86 08/15/2015   TSH 1.51 08/15/2015   TSH 1.99 02/11/2015      Medication List       This list is accurate as of: 08/15/15 11:59 PM.  Always use your most recent med list.               atenolol 50 MG tablet  Commonly known as:  TENORMIN    Take 50 mg by mouth daily.     calcium carbonate 600 MG Tabs tablet  Commonly known as:  OS-CAL  Take 600 mg by mouth 2 (two) times daily with a meal.     cholecalciferol 1000 UNITS tablet  Commonly known as:  VITAMIN D  Take 5,000 Units by mouth daily.     levothyroxine 88 MCG tablet  Commonly known as:  SYNTHROID, LEVOTHROID  Take 88 mcg by mouth daily before breakfast.     liothyronine 25 MCG tablet  Commonly known as:  CYTOMEL     losartan 50 MG tablet  Commonly known as:  COZAAR  Take 50 mg by mouth daily.     Magnesium 300 MG Caps  Take by mouth.     OMEGA FATTY ACIDS-VITAMINS PO  Take by mouth.        Allergies:  Allergies  Allergen Reactions  . Pneumococcal 13-Val Conj Vacc Other (See Comments)  . Codeine Nausea And Vomiting and Rash  . Diphth-Acell Pertussis-Tetanus Palpitations    Past Medical History  Diagnosis Date  . Gastric reflux   . Arthritis   . Generalized headaches   . Pain in limb   . Thyroid disease   . Allergy   .  Cystocele   . Fibroids   . Heart murmur     Mild mitral regurgitation  . Hypertension   . Obstructive sleep apnea     There is no history of radiation to the neck in childhood  Past Surgical History  Procedure Laterality Date  . Tonsilectomy, adenoidectomy, bilateral myringotomy and tubes      Family History  Problem Relation Age of Onset  . Cancer Father   . Cancer Mother   . Thyroid disease Sister     Social History:  reports that she has never smoked. She has never used smokeless tobacco. She reports that she does not drink alcohol or use illicit drugs.   Review of Systems:  No history of recent weight change, has difficulty losing weight  There is a  history of high blood pressure.            She has had sleep apnea diagnosed in 2013         Examination:   BP 130/86 mmHg  Pulse 63  Temp(Src) 98.6 F (37 C) (Oral)  Ht 5' 3.5" (1.613 m)  Wt 223 lb (101.152 kg)  BMI 38.88 kg/m2  SpO2 95%        THYROID: Thyroid nodule is palpable on the left side, this is a 4.5-5 cm very soft smooth thyroid nodule extending onto the isthmus area.  It is relatively flat and nontender Right lobe was not palpable    Biceps reflexes are normal   Assessment/Plan:  Thyroid nodule: She has a simple cyst on the left side Patient is asymptomatic again and it does not appear to be a prominent as on the last visit This can be followed clinically periodically No need for follow-up ultrasound  HYPOTHYROIDISM: Thyroid levels will be rechecked today and her dose adjusted if needed, currently doing fairly well with long-term regimen of Cytomel and thyroid  [facility] 08/16/2015

## 2015-08-16 NOTE — Progress Notes (Signed)
Quick Note:  Please let patient know that the lab result is normal, stay on the same medication and needs to make an appointment for follow-up in 6 months with labs  ______

## 2015-12-13 DIAGNOSIS — M546 Pain in thoracic spine: Secondary | ICD-10-CM | POA: Diagnosis not present

## 2015-12-13 DIAGNOSIS — M542 Cervicalgia: Secondary | ICD-10-CM | POA: Diagnosis not present

## 2015-12-16 ENCOUNTER — Other Ambulatory Visit: Payer: Self-pay | Admitting: Physical Medicine and Rehabilitation

## 2015-12-16 DIAGNOSIS — R221 Localized swelling, mass and lump, neck: Secondary | ICD-10-CM

## 2015-12-16 DIAGNOSIS — E041 Nontoxic single thyroid nodule: Secondary | ICD-10-CM

## 2015-12-20 DIAGNOSIS — M542 Cervicalgia: Secondary | ICD-10-CM | POA: Diagnosis not present

## 2015-12-20 DIAGNOSIS — M546 Pain in thoracic spine: Secondary | ICD-10-CM | POA: Diagnosis not present

## 2015-12-20 DIAGNOSIS — M5136 Other intervertebral disc degeneration, lumbar region: Secondary | ICD-10-CM | POA: Diagnosis not present

## 2016-01-13 DIAGNOSIS — G4733 Obstructive sleep apnea (adult) (pediatric): Secondary | ICD-10-CM | POA: Diagnosis not present

## 2016-01-23 ENCOUNTER — Telehealth: Payer: Self-pay | Admitting: Endocrinology

## 2016-01-23 NOTE — Telephone Encounter (Signed)
Please see below.

## 2016-01-23 NOTE — Telephone Encounter (Signed)
PT called wanting to know if we received MRI results on her throat from [facility].

## 2016-01-24 NOTE — Telephone Encounter (Signed)
Noted, detailed voice mail left for patient

## 2016-01-24 NOTE — Telephone Encounter (Signed)
Yes and she already has a known cyst in the thyroid from previous ultrasound.  We can discuss this when she comes in next month

## 2016-02-14 ENCOUNTER — Ambulatory Visit (INDEPENDENT_AMBULATORY_CARE_PROVIDER_SITE_OTHER): Payer: PPO | Admitting: Endocrinology

## 2016-02-14 ENCOUNTER — Other Ambulatory Visit (INDEPENDENT_AMBULATORY_CARE_PROVIDER_SITE_OTHER): Payer: PPO

## 2016-02-14 ENCOUNTER — Encounter: Payer: Self-pay | Admitting: Endocrinology

## 2016-02-14 VITALS — BP 128/64 | HR 61 | Temp 98.5°F | Resp 16 | Ht 63.5 in | Wt 227.2 lb

## 2016-02-14 DIAGNOSIS — E063 Autoimmune thyroiditis: Secondary | ICD-10-CM

## 2016-02-14 DIAGNOSIS — G4733 Obstructive sleep apnea (adult) (pediatric): Secondary | ICD-10-CM

## 2016-02-14 DIAGNOSIS — E038 Other specified hypothyroidism: Secondary | ICD-10-CM | POA: Diagnosis not present

## 2016-02-14 DIAGNOSIS — E041 Nontoxic single thyroid nodule: Secondary | ICD-10-CM | POA: Diagnosis not present

## 2016-02-14 LAB — T3, FREE: T3, Free: 2.9 pg/mL (ref 2.3–4.2)

## 2016-02-14 LAB — T4, FREE: Free T4: 0.74 ng/dL (ref 0.60–1.60)

## 2016-02-14 LAB — TSH: TSH: 4 u[IU]/mL (ref 0.35–4.50)

## 2016-02-14 NOTE — Progress Notes (Signed)
Patient ID: Sherry Bell, female   DOB: 02-18-1946, 70 y.o.   MRN: [id number]           Reason for Appointment: Follow-up of hypothyroidism and of thyroid nodule    History of Present Illness:   The patient's thyroid nodule was first discovered in early 2016 soon after she had treatment for a respiratory infection in 11/2014 She had felt a swelling on the left side of her neck and was seen by her PCP for evaluation in 3/16 She did not have any swelling prior to that.  She does not feel that her neck swelling has increased in size and it may have moved more towards the center of the neck  The thyroid ultrasound showed the following Dominant simple appearing cyst occupies much of the left lobe and measures approximately 5.2 x 5.4 x 4.6 cm.  This cyst does not have any complex features or mural nodularity. On review of her previous records she had a predominantly cystic lesion in the upper pole of her thyroid measuring 1.7 cm in 2003 This was biopsied in 2002 and was benign Also she had a subcentimeter cyst in the lower left pole of the left side in 2003   HYPOTHYROIDISM:  About 15-20 years ago the patient had gone to her physician because of weight gain. She does not think she had any unusual fatigue at that time She was found to be hypothyroid and has been on thyroid hormones since then.  She does not think she felt any different with taking the medication but her weight stabilized.  Her previous endocrinologist put her on combination of Synthroid and Cytomel 1/2 bid  and her dose has been fairly stable for some time   More recently she said that she has been only taking her Cytomel in the morning and forgets to take it in the evening She does feel more tired for the last few months, this is persistent throughout the day and she tends to feel sleepy Some cold intolerance present.   Lab Results  Component Value Date   FREET4 0.74 02/14/2016   FREET4 0.86 08/15/2015   TSH 4.00  02/14/2016   TSH 1.51 08/15/2015   TSH 1.99 02/11/2015      Medication List       This list is accurate as of: 02/14/16  9:31 PM.  Always use your most recent med list.               atenolol 50 MG tablet  Commonly known as:  TENORMIN  Take 50 mg by mouth daily.     calcium carbonate 600 MG Tabs tablet  Commonly known as:  OS-CAL  Take 600 mg by mouth 2 (two) times daily with a meal. Reported on 02/14/2016     cholecalciferol 1000 units tablet  Commonly known as:  VITAMIN D  Take 5,000 Units by mouth daily.     levothyroxine 88 MCG tablet  Commonly known as:  SYNTHROID, LEVOTHROID  Take 88 mcg by mouth daily before breakfast.     liothyronine 25 MCG tablet  Commonly known as:  CYTOMEL     losartan 50 MG tablet  Commonly known as:  COZAAR  Take 50 mg by mouth daily.     Magnesium 300 MG Caps  Take by mouth. Reported on 02/14/2016     OMEGA FATTY ACIDS-VITAMINS PO  Take by mouth. Reported on 02/14/2016        Allergies:  Allergies  Allergen Reactions  .  Pneumococcal 13-Val Conj Vacc Other (See Comments)  . Codeine Nausea And Vomiting and Rash  . Diphth-Acell Pertussis-Tetanus Palpitations    Past Medical History  Diagnosis Date  . Gastric reflux   . Arthritis   . Generalized headaches   . Pain in limb   . Thyroid disease   . Allergy   . Cystocele   . Fibroids   . Heart murmur     Mild mitral regurgitation  . Hypertension   . Obstructive sleep apnea     There is no history of radiation to the neck in childhood  Past Surgical History  Procedure Laterality Date  . Tonsilectomy, adenoidectomy, bilateral myringotomy and tubes      Family History  Problem Relation Age of Onset  . Cancer Father   . Cancer Mother   . Thyroid disease Sister     Social History:  reports that she has never smoked. She has never used smokeless tobacco. She reports that she does not drink alcohol or use illicit drugs.   Review of Systems:   There is a  history  of high blood pressure.          She has had sleep apnea diagnosed in 2013 using CPAP         Examination:   BP 128/64 mmHg  Pulse 61  Temp(Src) 98.5 F (36.9 C)  Resp 16  Ht 5' 3.5" (1.613 m)  Wt 227 lb 3.2 oz (103.057 kg)  BMI 39.61 kg/m2  SpO2 96%       THYROID: Thyroid nodule is palpable on the left side and  isthmus, this is  About 5 cm very soft smooth thyroid nodule    It is relatively flat  no lymphadenopathy in the neck Right lobe was not palpable    Biceps reflexes are normal   Assessment/Plan:  Thyroid nodule: She has a simple cyst on the left side Patient is asymptomatic again    Her recent MRI scan indicates the sizes only slightly more than on the ultrasound done previously This can be followed clinically periodically No need for follow-up ultrasound  HYPOTHYROIDISM: Thyroid levels will be rechecked today and her dose adjusted if needed, currently doing fairly well with long-term regimen of Cytomel and thyroid.   FATIGUE and somnolence: Likely to be from her sleep apnea although Will first review her thyroid levels  Aliviana Burdell 02/14/2016    addendum: Her TSH is 4.0, and likely to be a cause of her fatigue and somnolence, will defer to pulmonology  Will increase her Synthroid to 100 g also Since T4 is relatively lower

## 2016-02-15 ENCOUNTER — Other Ambulatory Visit: Payer: Self-pay | Admitting: *Deleted

## 2016-02-15 MED ORDER — LEVOTHYROXINE SODIUM 100 MCG PO TABS: 100.0000 ug | ORAL_TABLET | Freq: Every day | ORAL | Status: DC

## 2016-02-21 ENCOUNTER — Telehealth: Payer: Self-pay | Admitting: Endocrinology

## 2016-02-21 DIAGNOSIS — R110 Nausea: Secondary | ICD-10-CM | POA: Diagnosis not present

## 2016-02-21 DIAGNOSIS — I10 Essential (primary) hypertension: Secondary | ICD-10-CM | POA: Diagnosis not present

## 2016-02-21 DIAGNOSIS — E039 Hypothyroidism, unspecified: Secondary | ICD-10-CM | POA: Diagnosis not present

## 2016-02-21 NOTE — Telephone Encounter (Signed)
Patient ask if you would send her labs results to dr Edwena Bunde [facility] phone [phone]) [phone]

## 2016-02-21 NOTE — Telephone Encounter (Signed)
Labs faxed to Dr. Rex Kras.

## 2016-02-22 ENCOUNTER — Encounter: Payer: Self-pay | Admitting: Internal Medicine

## 2016-02-22 DIAGNOSIS — M546 Pain in thoracic spine: Secondary | ICD-10-CM | POA: Diagnosis not present

## 2016-02-22 DIAGNOSIS — R5383 Other fatigue: Secondary | ICD-10-CM | POA: Diagnosis not present

## 2016-02-22 DIAGNOSIS — G4730 Sleep apnea, unspecified: Secondary | ICD-10-CM | POA: Diagnosis not present

## 2016-02-22 DIAGNOSIS — E7800 Pure hypercholesterolemia, unspecified: Secondary | ICD-10-CM | POA: Diagnosis not present

## 2016-02-22 DIAGNOSIS — R112 Nausea with vomiting, unspecified: Secondary | ICD-10-CM | POA: Diagnosis not present

## 2016-03-19 DIAGNOSIS — E7800 Pure hypercholesterolemia, unspecified: Secondary | ICD-10-CM | POA: Diagnosis not present

## 2016-03-19 DIAGNOSIS — G4733 Obstructive sleep apnea (adult) (pediatric): Secondary | ICD-10-CM | POA: Diagnosis not present

## 2016-03-19 DIAGNOSIS — I10 Essential (primary) hypertension: Secondary | ICD-10-CM | POA: Diagnosis not present

## 2016-04-16 ENCOUNTER — Other Ambulatory Visit (INDEPENDENT_AMBULATORY_CARE_PROVIDER_SITE_OTHER): Payer: PPO

## 2016-04-16 ENCOUNTER — Emergency Department (HOSPITAL_COMMUNITY)
Admission: EM | Admit: 2016-04-16 | Discharge: 2016-04-17 | Disposition: A | Discharge status: Home / Self Care | Payer: PPO | Attending: Emergency Medicine | Admitting: Emergency Medicine

## 2016-04-16 DIAGNOSIS — R197 Diarrhea, unspecified: Secondary | ICD-10-CM | POA: Diagnosis not present

## 2016-04-16 DIAGNOSIS — E038 Other specified hypothyroidism: Secondary | ICD-10-CM | POA: Diagnosis not present

## 2016-04-16 DIAGNOSIS — R509 Fever, unspecified: Secondary | ICD-10-CM | POA: Diagnosis not present

## 2016-04-16 DIAGNOSIS — N390 Urinary tract infection, site not specified: Secondary | ICD-10-CM | POA: Diagnosis not present

## 2016-04-16 DIAGNOSIS — M1990 Unspecified osteoarthritis, unspecified site: Secondary | ICD-10-CM | POA: Diagnosis not present

## 2016-04-16 DIAGNOSIS — Z79899 Other long term (current) drug therapy: Secondary | ICD-10-CM | POA: Diagnosis not present

## 2016-04-16 DIAGNOSIS — I10 Essential (primary) hypertension: Secondary | ICD-10-CM | POA: Insufficient documentation

## 2016-04-16 DIAGNOSIS — R110 Nausea: Secondary | ICD-10-CM | POA: Diagnosis not present

## 2016-04-16 DIAGNOSIS — G4733 Obstructive sleep apnea (adult) (pediatric): Secondary | ICD-10-CM | POA: Diagnosis not present

## 2016-04-16 DIAGNOSIS — J9811 Atelectasis: Secondary | ICD-10-CM | POA: Diagnosis not present

## 2016-04-16 DIAGNOSIS — E063 Autoimmune thyroiditis: Secondary | ICD-10-CM

## 2016-04-16 LAB — TSH: TSH: 2.37 u[IU]/mL (ref 0.35–4.50)

## 2016-04-16 LAB — T4, FREE: Free T4: 0.84 ng/dL (ref 0.60–1.60)

## 2016-04-16 LAB — T3, FREE: T3, Free: 3 pg/mL (ref 2.3–4.2)

## 2016-04-17 ENCOUNTER — Encounter (HOSPITAL_COMMUNITY): Payer: Self-pay | Admitting: Emergency Medicine

## 2016-04-17 ENCOUNTER — Emergency Department (HOSPITAL_COMMUNITY): Payer: PPO

## 2016-04-17 DIAGNOSIS — J9811 Atelectasis: Secondary | ICD-10-CM | POA: Diagnosis not present

## 2016-04-17 LAB — COMPREHENSIVE METABOLIC PANEL
ALT: 20 U/L (ref 14–54)
AST: 20 U/L (ref 15–41)
Albumin: 4.3 g/dL (ref 3.5–5.0)
Alkaline Phosphatase: 49 U/L (ref 38–126)
Anion gap: 8 (ref 5–15)
BUN: 16 mg/dL (ref 6–20)
CO2: 25 mmol/L (ref 22–32)
Calcium: 9.1 mg/dL (ref 8.9–10.3)
Chloride: 103 mmol/L (ref 101–111)
Creatinine, Ser: 0.91 mg/dL (ref 0.44–1.00)
GFR calc Af Amer: >60 mL/min (ref >60)
GFR calc non Af Amer: >60 mL/min (ref >60)
Glucose, Bld: 162 mg/dL — ABNORMAL HIGH (ref 65–99)
Potassium: 4.5 mmol/L (ref 3.5–5.1)
Sodium: 136 mmol/L (ref 135–145)
Total Bilirubin: 0.8 mg/dL (ref 0.3–1.2)
Total Protein: 7.6 g/dL (ref 6.5–8.1)

## 2016-04-17 LAB — CBC WITH DIFFERENTIAL/PLATELET
Basophils Absolute: 0 10*3/uL (ref 0.0–0.1)
Basophils Relative: 0 %
Eosinophils Absolute: 0.1 10*3/uL (ref 0.0–0.7)
Eosinophils Relative: 1 %
HCT: 38.6 % (ref 36.0–46.0)
Hemoglobin: 13.2 g/dL (ref 12.0–15.0)
Lymphocytes Relative: 9 %
Lymphs Abs: 1.1 10*3/uL (ref 0.7–4.0)
MCH: 30.4 pg (ref 26.0–34.0)
MCHC: 34.2 g/dL (ref 30.0–36.0)
MCV: 88.9 fL (ref 78.0–100.0)
Monocytes Absolute: 0.6 10*3/uL (ref 0.1–1.0)
Monocytes Relative: 5 %
Neutro Abs: 11.6 10*3/uL — ABNORMAL HIGH (ref 1.7–7.7)
Neutrophils Relative %: 85 %
Platelets: 199 10*3/uL (ref 150–400)
RBC: 4.34 MIL/uL (ref 3.87–5.11)
RDW: 12.9 % (ref 11.5–15.5)
WBC: 13.5 10*3/uL — ABNORMAL HIGH (ref 4.0–10.5)

## 2016-04-17 LAB — URINALYSIS, ROUTINE W REFLEX MICROSCOPIC
Bilirubin Urine: NEGATIVE
Glucose, UA: NEGATIVE mg/dL
Ketones, ur: NEGATIVE mg/dL
Nitrite: NEGATIVE
Protein, ur: NEGATIVE mg/dL
Specific Gravity, Urine: 1.018 (ref 1.005–1.030)
pH: 5.5 (ref 5.0–8.0)

## 2016-04-17 LAB — T4, FREE: Free T4: 0.87 ng/dL (ref 0.61–1.12)

## 2016-04-17 LAB — URINE MICROSCOPIC-ADD ON

## 2016-04-17 LAB — LIPASE, BLOOD: Lipase: 18 U/L (ref 11–51)

## 2016-04-17 LAB — TSH: TSH: 1.685 u[IU]/mL (ref 0.350–4.500)

## 2016-04-17 LAB — CK: Total CK: 145 U/L (ref 38–234)

## 2016-04-17 MED ORDER — CEPHALEXIN 500 MG PO CAPS: 500.0000 mg | ORAL_CAPSULE | Freq: Three times a day (TID) | ORAL | Status: DC

## 2016-04-17 MED ORDER — RANITIDINE HCL 150 MG/10ML PO SYRP
300.0000 mg | ORAL_SOLUTION | Freq: Once | ORAL | Status: AC
  Administered 2016-04-17: 300 mg via ORAL
  Filled 2016-04-17: qty 20

## 2016-04-17 MED ORDER — SODIUM CHLORIDE 0.9 % IV BOLUS (SEPSIS)
2000.0000 mL | Freq: Once | INTRAVENOUS | Status: AC
  Administered 2016-04-17: 2000 mL via INTRAVENOUS

## 2016-04-17 MED ORDER — CEPHALEXIN 500 MG PO CAPS
500.0000 mg | ORAL_CAPSULE | Freq: Once | ORAL | Status: AC
  Administered 2016-04-17: 500 mg via ORAL
  Filled 2016-04-17: qty 1

## 2016-04-17 MED ORDER — ACETAMINOPHEN 325 MG PO TABS
650.0000 mg | ORAL_TABLET | Freq: Once | ORAL | Status: AC | PRN
  Administered 2016-04-17: 650 mg via ORAL
  Filled 2016-04-17: qty 2

## 2016-04-17 NOTE — ED Notes (Signed)
Provider in room  

## 2016-04-17 NOTE — Discharge Instructions (Signed)
Night in the emergency room.  He retreated for diarrhea was also found that you have a urinary tract infection.  Antibiotics have been started.  Please take these as directed until, completed.  Follow-up with your primary care physician as needed

## 2016-04-17 NOTE — ED Notes (Signed)
Pt states she has nausea without vomiting and is aching all over esp in her legs  Pt states she feels dehydrated  Pt states she took aleve about 2 hrs ago 1 tablet  Pt states her dr recently changed her thyroid medication

## 2016-04-17 NOTE — ED Provider Notes (Signed)
CSN: [id number]     Arrival date & time 04/16/16  2326 History   First MD Initiated Contact with Patient 04/17/16 [id number]     Chief Complaint  Patient presents with  . Fever  . Nausea     (Consider location/radiation/quality/duration/timing/severity/associated sxs/prior Treatment) HPI Comments: This a 70 year old female who reports that shows sudden onset of chills.  She felt feverish and developed diarrhea with nausea.  He had multiple episodes of mucousy diarrhea, nausea, without vomiting and achy.  She took Aleve 2 hours prior to arrival.  She does state that her PCP adjusted her thyroid medication recently.  Other than that her medications have been stable and she's been taking them on a regular basis. She does have a history of GERD, headaches, thyroid disease, cystocele, which she states intermittently causes her to be incontinent or have hesitancy while trying to urinate.  No history of UTIs  Patient is a 70 y.o. female presenting with fever. The history is provided by the patient.  Fever Temp source:  Subjective Onset quality:  Gradual Timing:  Constant Progression:  Worsening Chronicity:  New Relieved by:  None tried Worsened by:  Nothing tried Ineffective treatments:  None tried Associated symptoms: diarrhea and nausea   Associated symptoms: no chest pain, no dysuria, no rhinorrhea and no vomiting   Diarrhea:    Quality:  Mucous   Number of occurrences:  Multiple   Severity:  Moderate   Timing:  Intermittent   Progression:  Unchanged Nausea:    Severity:  Mild   Onset quality:  Gradual   Timing:  Intermittent   Progression:  Unchanged   Past Medical History  Diagnosis Date  . Gastric reflux   . Arthritis   . Generalized headaches   . Pain in limb   . Thyroid disease   . Allergy   . Cystocele   . Fibroids   . Heart murmur     Mild mitral regurgitation  . Hypertension   . Obstructive sleep apnea    Past Surgical History  Procedure Laterality Date  .  Tonsilectomy, adenoidectomy, bilateral myringotomy and tubes     Family History  Problem Relation Age of Onset  . Cancer Father   . Cancer Mother   . Thyroid disease Sister    Social History  Substance Use Topics  . Smoking status: Never Smoker   . Smokeless tobacco: Never Used  . Alcohol Use: No   OB History    No data available     Review of Systems  Constitutional: Positive for fever.  HENT: Negative for rhinorrhea.   Respiratory: Negative for shortness of breath.   Cardiovascular: Negative for chest pain.  Gastrointestinal: Positive for nausea and diarrhea. Negative for vomiting, abdominal pain and constipation.  Genitourinary: Negative for dysuria and hematuria.  All other systems reviewed and are negative.     Allergies  Pneumococcal 13-val conj vacc; Codeine; and Diphth-acell pertussis-tetanus  Home Medications   Prior to Admission medications   Medication Sig Start Date End Date Taking? Authorizing Provider  atenolol (TENORMIN) 50 MG tablet Take 50 mg by mouth daily. 08/10/15  Yes Historical Provider, MD  cholecalciferol (VITAMIN D) 1000 UNITS tablet Take 5,000 Units by mouth daily.   Yes Historical Provider, MD  levothyroxine (SYNTHROID, LEVOTHROID) 100 MCG tablet Take 1 tablet (100 mcg total) by mouth daily. 02/15/16  Yes Elayne Snare, MD  liothyronine (CYTOMEL) 25 MCG tablet  04/08/14  Yes Historical Provider, MD  losartan (COZAAR) 50 MG tablet  Take 50 mg by mouth daily.   Yes Historical Provider, MD  Magnesium 300 MG CAPS Take by mouth. Reported on 02/14/2016   Yes Historical Provider, MD  cephALEXin (KEFLEX) 500 MG capsule Take 1 capsule (500 mg total) by mouth 3 (three) times daily. 04/17/16   Junius Creamer, NP   BP 137/59 mmHg  Pulse 79  Temp(Src) 97.3 F (36.3 C) (Oral)  Resp 18  SpO2 96% Physical Exam  Constitutional: She is oriented to person, place, and time. She appears well-developed and well-nourished.  HENT:  Head: Normocephalic and atraumatic.   Eyes: Pupils are equal, round, and reactive to light.  Neck: Normal range of motion.  Cardiovascular: Normal rate and regular rhythm.   Pulmonary/Chest: Effort normal and breath sounds normal.  Abdominal: Soft. Bowel sounds are normal. She exhibits no distension. There is no tenderness.  Musculoskeletal: Normal range of motion.  Neurological: She is alert and oriented to person, place, and time.  Skin: Skin is warm and dry.  Nursing note and vitals reviewed.   ED Course  Procedures (including critical care time) Labs Review Labs Reviewed  CBC WITH DIFFERENTIAL/PLATELET - Abnormal; Notable for the following:    WBC 13.5 (*)    Neutro Abs 11.6 (*)    All other components within normal limits  COMPREHENSIVE METABOLIC PANEL - Abnormal; Notable for the following:    Glucose, Bld 162 (*)    All other components within normal limits  URINALYSIS, ROUTINE W REFLEX MICROSCOPIC (NOT AT [facility]) - Abnormal; Notable for the following:    APPearance CLOUDY (*)    Hgb urine dipstick TRACE (*)    Leukocytes, UA LARGE (*)    All other components within normal limits  URINE MICROSCOPIC-ADD ON - Abnormal; Notable for the following:    Squamous Epithelial / LPF 6-30 (*)    Bacteria, UA FEW (*)    All other components within normal limits  LIPASE, BLOOD  CK  TSH  T4, FREE    Imaging Review Dg Chest 2 View  04/17/2016  CLINICAL DATA:  Fever, body aches for 1 day. EXAM: CHEST  2 VIEW COMPARISON:  04/22/2015 FINDINGS: The cardiomediastinal contours are normal. Mild vascular congestion. Minimal bibasilar atelectasis. No consolidation, pleural effusion, or pneumothorax. No acute osseous abnormalities are seen. IMPRESSION: Mild vascular congestion and minimal bibasilar atelectasis. No evidence of pneumonia. Electronically Signed   By: Jeb Levering M.D.   On: 04/17/2016 02:45   I have personally reviewed and evaluated these images and lab results as part of my medical decision-making.   EKG  Interpretation None     Patient received IV fluids prior to my assessment at this time.  She states she feels much better.  She's not had any further episodes of diarrhea.  She does complain of exacerbation of her GERD symptom with slight nausea. MDM   Final diagnoses:  UTI (lower urinary tract infection)  Diarrhea, unspecified type         Junius Creamer, NP 04/17/16 [id number]  Everlene Balls, MD 04/17/16 [id number]

## 2016-04-17 NOTE — ED Notes (Signed)
Messaged for Zantac from [facility] [employer]

## 2016-04-24 ENCOUNTER — Encounter: Payer: Self-pay | Admitting: Endocrinology

## 2016-04-24 ENCOUNTER — Ambulatory Visit (INDEPENDENT_AMBULATORY_CARE_PROVIDER_SITE_OTHER): Payer: PPO | Admitting: Endocrinology

## 2016-04-24 ENCOUNTER — Telehealth: Payer: Self-pay | Admitting: Endocrinology

## 2016-04-24 VITALS — BP 124/74 | HR 52 | Temp 97.9°F | Resp 14 | Ht 63.5 in | Wt 230.2 lb

## 2016-04-24 DIAGNOSIS — E063 Autoimmune thyroiditis: Secondary | ICD-10-CM

## 2016-04-24 DIAGNOSIS — E038 Other specified hypothyroidism: Secondary | ICD-10-CM

## 2016-04-24 DIAGNOSIS — E041 Nontoxic single thyroid nodule: Secondary | ICD-10-CM

## 2016-04-24 NOTE — Telephone Encounter (Signed)
i just need a 15-minute appt for this--next avail

## 2016-04-24 NOTE — Telephone Encounter (Signed)
If Dr. Loanne Drilling can do the aspiration without needing another ultrasound would prefer this as it will be more straightforward.   She has a Large palpable cyst needing drainage, Sean: Will you be able to do this for  her?

## 2016-04-24 NOTE — Telephone Encounter (Signed)
Please see below.

## 2016-04-24 NOTE — Telephone Encounter (Signed)
[facility] needs clarification on the biopsy order but the reason says aspiration  Also there is no recent thyroid ultrasound please advise on what they are to do.  [id number] [place]

## 2016-04-24 NOTE — Progress Notes (Signed)
Patient ID: Sherry Bell, female   DOB: 12-20-1945, 70 y.o.   MRN: [id number]           Reason for Appointment: Follow-up of hypothyroidism and of thyroid nodule    History of Present Illness:   The patient's thyroid nodule was first discovered in early 2016 soon after she had a respiratory infection in 11/2014 She had felt a swelling on the left side of her neck and was seen by her PCP for evaluation in 3/16 She did not have any swelling prior to that.  She does not feel that her neck swelling has increased in size and it may have moved more towards the center of the neck  The thyroid ultrasound showed the following Dominant simple appearing cyst occupies much of the left lobe and measures approximately 5.2 x 5.4 x 4.6 cm.  This cyst does not have any complex features or mural nodularity. On review of her previous records she had a predominantly cystic lesion in the upper pole of her thyroid measuring 1.7 cm in 2003 This was biopsied in 2002 and was benign Also she had a subcentimeter cyst in the lower left pole of the left side in 2003  More recently she thinks that she is having a pressure sensation in her neck especially when lying down Also sometimes has difficulty swallowing  HYPOTHYROIDISM:  About 15-20 years ago the patient had gone to her physician because of weight gain. She does not think she had any unusual fatigue at that time She was found to be hypothyroid and has been on thyroid hormones since then.  She does not think she felt any different with taking the medication but her weight stabilized.  Her previous endocrinologist put her on combination of Synthroid and Cytomel 1/2 bid  Again she has been only taking her Cytomel in the morning and forgets to take it in the evening With her TSH being high normal her Synthroid was increased up to 100 g She thinks she does not feel as tired although this is not completely resolved Also has some sleepiness    Lab Results    Component Value Date   FREET4 0.87 04/17/2016   FREET4 0.84 04/16/2016   FREET4 0.74 02/14/2016   TSH 1.685 04/17/2016   TSH 2.37 04/16/2016   TSH 4.00 02/14/2016      Medication List       This list is accurate as of: 04/24/16 11:20 AM.  Always use your most recent med list.               atenolol 50 MG tablet  Commonly known as:  TENORMIN  Take 50 mg by mouth daily.     cephALEXin 500 MG capsule  Commonly known as:  KEFLEX  Take 1 capsule (500 mg total) by mouth 3 (three) times daily.     cholecalciferol 1000 units tablet  Commonly known as:  VITAMIN D  Take 5,000 Units by mouth daily.     levothyroxine 100 MCG tablet  Commonly known as:  SYNTHROID, LEVOTHROID  Take 1 tablet (100 mcg total) by mouth daily.     liothyronine 25 MCG tablet  Commonly known as:  CYTOMEL     losartan 50 MG tablet  Commonly known as:  COZAAR  Take 50 mg by mouth daily.     Magnesium 300 MG Caps  Take by mouth. Reported on 02/14/2016        Allergies:  Allergies  Allergen Reactions  . Pneumococcal  13-Val Conj Vacc Other (See Comments)  . Codeine Nausea And Vomiting, Rash and Nausea Only  . Diphth-Acell Pertussis-Tetanus Palpitations    Past Medical History  Diagnosis Date  . Gastric reflux   . Arthritis   . Generalized headaches   . Pain in limb   . Thyroid disease   . Allergy   . Cystocele   . Fibroids   . Heart murmur     Mild mitral regurgitation  . Hypertension   . Obstructive sleep apnea     There is no history of radiation to the neck in childhood  Past Surgical History  Procedure Laterality Date  . Tonsilectomy, adenoidectomy, bilateral myringotomy and tubes      Family History  Problem Relation Age of Onset  . Cancer Father   . Cancer Mother   . Thyroid disease Sister     Social History:  reports that she has never smoked. She has never used smokeless tobacco. She reports that she does not drink alcohol or use illicit drugs.   Review of  Systems:      She has had sleep apnea diagnosed in 2013 using CPAP but is due to have a follow-up with her treating physician for further evaluation         Examination:   BP 124/74 mmHg  Pulse 52  Temp(Src) 97.9 F (36.6 C)  Resp 14  Ht 5' 3.5" (1.613 m)  Wt 230 lb 3.2 oz (104.418 kg)  BMI 40.13 kg/m2  SpO2 97%       Thyroid nodule is palpable on the left side and  isthmus, this is  About 6 cm soft smooth thyroid nodule  Right lobe was not palpable    Biceps reflexes are normal   Assessment/Plan:  Thyroid nodule: She has a simple cyst on the left side but may be enlarging She does complain of pressure symptoms with her discomfort and certain positions and also some trouble swallowing  Since she is complaining about pressure symptoms will schedule her for needle aspiration and removal of fluid  HYPOTHYROIDISM: Thyroid levels are back to normal with increasing Synthroid Since she is usually not taking her Cytomel in the afternoon will have her leave off   FATIGUE and somnolence: She will follow-up with her sleep disorder specialist Dr. Gloris Manchester 04/24/2016

## 2016-04-25 NOTE — Telephone Encounter (Signed)
I contacted the pt. Pt scheduled for 04/30/2016.

## 2016-04-30 ENCOUNTER — Ambulatory Visit (INDEPENDENT_AMBULATORY_CARE_PROVIDER_SITE_OTHER): Payer: PPO | Admitting: Endocrinology

## 2016-04-30 ENCOUNTER — Ambulatory Visit (INDEPENDENT_AMBULATORY_CARE_PROVIDER_SITE_OTHER): Payer: PPO | Admitting: Internal Medicine

## 2016-04-30 ENCOUNTER — Encounter: Payer: Self-pay | Admitting: Internal Medicine

## 2016-04-30 ENCOUNTER — Other Ambulatory Visit (HOSPITAL_COMMUNITY)
Admission: RE | Admit: 2016-04-30 | Discharge: 2016-04-30 | Disposition: A | Discharge status: Home / Self Care | Payer: PPO | Source: Ambulatory Visit | Attending: Endocrinology | Admitting: Endocrinology

## 2016-04-30 VITALS — BP 110/70 | HR 76 | Ht 63.5 in | Wt 226.0 lb

## 2016-04-30 VITALS — BP 134/66 | HR 66 | Temp 98.1°F | Ht 63.5 in | Wt 226.0 lb

## 2016-04-30 DIAGNOSIS — Z8619 Personal history of other infectious and parasitic diseases: Secondary | ICD-10-CM

## 2016-04-30 DIAGNOSIS — E041 Nontoxic single thyroid nodule: Secondary | ICD-10-CM

## 2016-04-30 DIAGNOSIS — R110 Nausea: Secondary | ICD-10-CM | POA: Diagnosis not present

## 2016-04-30 NOTE — Patient Instructions (Addendum)
You will be due for a recall colonoscopy in 12/2016. We will send you a reminder in the mail when it gets closer to that time.  You have been scheduled for an H. Pylori/Urea Breath Test. Your appointment is on Monday, 05/14/16 at 7:15 am. Please go to [facility] for check in. Please plan on being there for around 20-30 minutes.  Please follow the instructions below to prepare for your test:  1. 05/13/16 (one day prior to your test) avoid high gas foods, high fiber goods, fruits and fruit juices, vegetables, beans, cereals, fiber supplements, onion and bell peppers. You may eat hamburger, chicken, beef, tuna fish and eggs.  2. You should have nothing to eat or drink after 9:00 pm on 05/13/16.   3. Do not smoke, chew gum or eat hard candy the morning of your test.  4. You may not have the test within 2 weeks after taking any antibiotics or within 4 weeks if confirming eradication of H.Pylori.  5. DO NOT take any Pepto-Bismol and/or proton pump inhibitors (Prevacid, Zegerid, Nexium, Prilosec, Protonix, Aciphex, Dexilant).  6. You may use over the counter strength Axid, Pepcid, Tagamet and Zantac 2 weeks prior to your tests. However, you should STOP these 24 hours before the test. You may use Tums and Maalox.  _____________________________________________________________________________________  If you are age 68 or older, your body mass index should be between 23-30. Your Body mass index is 39.4 kg/(m^2). If this is out of the aforementioned range listed, please consider follow up with your Primary Care Provider.  If you are age 80 or younger, your body mass index should be between 19-25. Your Body mass index is 39.4 kg/(m^2). If this is out of the aformentioned range listed, please consider follow up with your Primary Care Provider.

## 2016-04-30 NOTE — Patient Instructions (Addendum)
We'll let you know about the biopsy results. Please see Dr Dwyane Dee in September, as scheduled. Please call sooner if the fluid come back, as we would be happy to remove more

## 2016-04-30 NOTE — Progress Notes (Signed)
Patient ID: Sherry Bell, female   DOB: [birth year], 70 y.o.   MRN: [id number] HPI: Hiilei Ned is a 70 year old female with a past medical history of H. pylori gastritis, GERD, sleep apnea, fibroids, who is seen in consultation at the request of Dr. Rex Kras to evaluate chronic nausea. She is here alone today. She reports she's had nearly 2 years of nausea after eating. This is not associated with abdominal pain or vomiting. Last 20-30 minutes. She occasionally feels a uncomfortableness in her esophagus which she describes as a "dry feeling". This also is worse after eating. She denies dysphagia or odynophagia. Denies frequent heartburn. She does occasionally experience heartburn but less than once per week. This often depends on the food that she eats. She reports normal bowel movements without blood in her stool or melena. She has had evaluation with endocrinology for a thyroid cyst. She is treated for hypothyroidism. She is scheduled for aspiration of her thyroid cyst later today with endocrinology. Occasionally this cyst puts "pressure" in the anterior neck worse with lying down. She denies dyspnea or chest pain. Denies family history of GI tract malignancy. She is not currently taking any over-the-counter GI medications. She does remember being diagnosed with H. pylori and reports she completed antibiotic therapy.  Prior upper endoscopy and colonoscopy were performed and reviewed. These were done with Dr. Lajoyce Corners in February 2008. Indication was Hemoccult positivity EGD -- snake skinning of the fundus of the stomach and some mild degree of esophagitis with a short linear ulcer seen. Biopsies showed benign squamous mucosa in the esophagus. No metaplasia, dysplasia seen. Stomach biopsy showed chronic active gastritis with numerous H. pylori. No metaplasia or dysplasia or cancer. Colonoscopy -- "negative examination".  Past Medical History  Diagnosis Date  . Gastric reflux   . Arthritis   . Generalized  headaches   . Pain in limb   . Thyroid disease   . Allergy   . Cystocele   . Fibroids   . Heart murmur     Mild mitral regurgitation  . Hypertension   . Obstructive sleep apnea     uses CPAP  . Varicose veins   . H. pylori infection     Past Surgical History  Procedure Laterality Date  . Tonsilectomy, adenoidectomy, bilateral myringotomy and tubes    . Colonoscopy      Dr Lajoyce Corners  . Esophagogastroduodenoscopy      Dr Lajoyce Corners    Outpatient Prescriptions Prior to Visit  Medication Sig Dispense Refill  . atenolol (TENORMIN) 50 MG tablet Take 50 mg by mouth daily.  0  . cholecalciferol (VITAMIN D) 1000 UNITS tablet Take 5,000 Units by mouth daily.    Marland Kitchen levothyroxine (SYNTHROID, LEVOTHROID) 100 MCG tablet Take 1 tablet (100 mcg total) by mouth daily. 90 tablet 1  . liothyronine (CYTOMEL) 25 MCG tablet Take 12.5 mcg by mouth 2 (two) times daily.     Marland Kitchen losartan (COZAAR) 50 MG tablet Take 50 mg by mouth daily.    . Magnesium 300 MG CAPS Take 1 capsule by mouth daily. Reported on 02/14/2016    . cephALEXin (KEFLEX) 500 MG capsule Take 1 capsule (500 mg total) by mouth 3 (three) times daily. 29 capsule 0   No facility-administered medications prior to visit.    Allergies  Allergen Reactions  . Pneumococcal 13-Val Conj Vacc Other (See Comments)  . Codeine Nausea And Vomiting, Rash and Nausea Only  . Diphth-Acell Pertussis-Tetanus Palpitations    Family History  Problem Relation  Age of Onset  . Throat cancer Father     worked at [employer]  . Hypothyroidism Sister     Social History  Substance Use Topics  . Smoking status: Never Smoker   . Smokeless tobacco: Never Used  . Alcohol Use: No    ROS: As per history of present illness, otherwise negative  BP 110/70 mmHg  Pulse 76  Ht 5' 3.5" (1.613 m)  Wt 226 lb (102.513 kg)  BMI 39.40 kg/m2 Constitutional: Well-developed and well-nourished. No distress. HEENT: Normocephalic and atraumatic. Oropharynx is clear and moist. No  oropharyngeal exudate. Conjunctivae are normal.  No scleral icterus. Neck: Neck supple. Palpable thyroid cyst anterior and left anterior neck, nontender. Cardiovascular: Normal rate, regular rhythm and intact distal pulses. No M/R/G Pulmonary/chest: Effort normal and breath sounds normal. No wheezing, rales or rhonchi. Abdominal: Soft, obese, nontender, nondistended. Bowel sounds active throughout. There are no masses palpable.  Extremities: no clubbing, cyanosis, or edema Lymphadenopathy: No cervical adenopathy noted. Neurological: Alert and oriented to person place and time. Skin: Skin is warm and dry. No rashes noted. Psychiatric: Normal mood and affect. Behavior is normal.  RELEVANT LABS AND IMAGING: CBC    Component Value Date/Time   WBC 13.5* 04/17/2016 0057   RBC 4.34 04/17/2016 0057   HGB 13.2 04/17/2016 0057   HCT 38.6 04/17/2016 0057   PLT 199 04/17/2016 0057   MCV 88.9 04/17/2016 0057   MCH 30.4 04/17/2016 0057   MCHC 34.2 04/17/2016 0057   RDW 12.9 04/17/2016 0057   LYMPHSABS 1.1 04/17/2016 0057   MONOABS 0.6 04/17/2016 0057   EOSABS 0.1 04/17/2016 0057   BASOSABS 0.0 04/17/2016 0057    CMP     Component Value Date/Time   NA 136 04/17/2016 0057   K 4.5 04/17/2016 0057   CL 103 04/17/2016 0057   CO2 25 04/17/2016 0057   GLUCOSE 162* 04/17/2016 0057   BUN 16 04/17/2016 0057   CREATININE 0.91 04/17/2016 0057   CALCIUM 9.1 04/17/2016 0057   PROT 7.6 04/17/2016 0057   ALBUMIN 4.3 04/17/2016 0057   AST 20 04/17/2016 0057   ALT 20 04/17/2016 0057   ALKPHOS 49 04/17/2016 0057   BILITOT 0.8 04/17/2016 0057   GFRNONAA >60 04/17/2016 0057   GFRAA >60 04/17/2016 0057    ASSESSMENT/PLAN: 70 year old female with a past medical history of H. pylori gastritis, GERD, sleep apnea, fibroids, who is seen in consultation at the request of Dr. Rex Kras to evaluate chronic nausea.  1. Chronic nausea -- given her history of recommended evaluating for recurrent H. pylori. We  discussed the methods of H. pylori diagnosis and will proceed with H. pylori breath testing. If positive she will be treated with Pylera. If symptoms persist after antibiotic therapy, upper endoscopy recommended. If breath test is negative for H. pylori, upper endoscopy is recommended. She understands and is happy with this plan.  2. Colorectal cancer screening -- repeat colonoscopy recommended February 2018 for screening after negative exam in 2008  [id number] [address], [place] [address]

## 2016-04-30 NOTE — Progress Notes (Signed)
   Subjective:    Patient ID: Sherry Bell, female    DOB: 22-Apr-1946, 70 y.o.   MRN: [id number]  HPI (procedure only)   Review of Systems     Objective:   Physical Exam   thyroid needle aspiration: consent obtained, signed form on chart The area is first sprayed with cooling agent local: xylocaine 2%, with epinephrine prep: alcohol pad 7 aspirations are done with 25g needles.   A total of 70 cc of clear fluid is aspirated, with resolution of the mass.   no complications.       Assessment & Plan:  Neck cyst, resolved with aspiration.  It is unusual for thyroid cystic fluid to be so clear.  Other causes of neck cysts could be considered.  However, this is too inferior to be a thyroglossal duct cyst, and [place] seems to indicate thyroid origin.   Patient is advised the following: Patient Instructions  We'll let you know about the biopsy results. Please see Dr Dwyane Dee in September, as scheduled. Please call sooner if the fluid come back, as we would be happy to remove more  Renato Shin, MD

## 2016-05-02 DIAGNOSIS — G4730 Sleep apnea, unspecified: Secondary | ICD-10-CM | POA: Diagnosis not present

## 2016-05-02 DIAGNOSIS — E7800 Pure hypercholesterolemia, unspecified: Secondary | ICD-10-CM | POA: Diagnosis not present

## 2016-05-02 DIAGNOSIS — E559 Vitamin D deficiency, unspecified: Secondary | ICD-10-CM | POA: Diagnosis not present

## 2016-05-02 DIAGNOSIS — Z0000 Encounter for general adult medical examination without abnormal findings: Secondary | ICD-10-CM | POA: Diagnosis not present

## 2016-05-02 DIAGNOSIS — M47812 Spondylosis without myelopathy or radiculopathy, cervical region: Secondary | ICD-10-CM | POA: Diagnosis not present

## 2016-05-02 DIAGNOSIS — R7301 Impaired fasting glucose: Secondary | ICD-10-CM | POA: Diagnosis not present

## 2016-05-02 DIAGNOSIS — E039 Hypothyroidism, unspecified: Secondary | ICD-10-CM | POA: Diagnosis not present

## 2016-05-02 DIAGNOSIS — Z13820 Encounter for screening for osteoporosis: Secondary | ICD-10-CM | POA: Diagnosis not present

## 2016-05-02 DIAGNOSIS — E042 Nontoxic multinodular goiter: Secondary | ICD-10-CM | POA: Diagnosis not present

## 2016-05-02 DIAGNOSIS — I10 Essential (primary) hypertension: Secondary | ICD-10-CM | POA: Diagnosis not present

## 2016-05-07 DIAGNOSIS — G4733 Obstructive sleep apnea (adult) (pediatric): Secondary | ICD-10-CM | POA: Diagnosis not present

## 2016-05-14 ENCOUNTER — Encounter: Payer: Self-pay | Admitting: Internal Medicine

## 2016-05-14 ENCOUNTER — Encounter: Payer: Self-pay | Admitting: *Deleted

## 2016-05-14 ENCOUNTER — Encounter (HOSPITAL_COMMUNITY)
Admission: RE | Disposition: A | Discharge status: Home / Self Care | Payer: Self-pay | Source: Ambulatory Visit | Attending: Internal Medicine

## 2016-05-14 ENCOUNTER — Telehealth: Payer: Self-pay | Admitting: *Deleted

## 2016-05-14 ENCOUNTER — Ambulatory Visit (HOSPITAL_COMMUNITY)
Admission: RE | Admit: 2016-05-14 | Discharge: 2016-05-14 | Disposition: A | Discharge status: Home / Self Care | Payer: PPO | Source: Ambulatory Visit | Attending: Internal Medicine | Admitting: Internal Medicine

## 2016-05-14 DIAGNOSIS — G4730 Sleep apnea, unspecified: Secondary | ICD-10-CM | POA: Insufficient documentation

## 2016-05-14 DIAGNOSIS — R5383 Other fatigue: Secondary | ICD-10-CM | POA: Insufficient documentation

## 2016-05-14 DIAGNOSIS — E7800 Pure hypercholesterolemia, unspecified: Secondary | ICD-10-CM | POA: Insufficient documentation

## 2016-05-14 DIAGNOSIS — Z79899 Other long term (current) drug therapy: Secondary | ICD-10-CM | POA: Insufficient documentation

## 2016-05-14 DIAGNOSIS — Z8719 Personal history of other diseases of the digestive system: Secondary | ICD-10-CM | POA: Diagnosis not present

## 2016-05-14 DIAGNOSIS — R112 Nausea with vomiting, unspecified: Secondary | ICD-10-CM | POA: Diagnosis not present

## 2016-05-14 DIAGNOSIS — I340 Nonrheumatic mitral (valve) insufficiency: Secondary | ICD-10-CM | POA: Insufficient documentation

## 2016-05-14 DIAGNOSIS — I10 Essential (primary) hypertension: Secondary | ICD-10-CM | POA: Insufficient documentation

## 2016-05-14 DIAGNOSIS — Z9989 Dependence on other enabling machines and devices: Secondary | ICD-10-CM | POA: Diagnosis not present

## 2016-05-14 DIAGNOSIS — E039 Hypothyroidism, unspecified: Secondary | ICD-10-CM | POA: Insufficient documentation

## 2016-05-14 DIAGNOSIS — M549 Dorsalgia, unspecified: Secondary | ICD-10-CM | POA: Insufficient documentation

## 2016-05-14 HISTORY — PX: BREATH TEK H PYLORI: SHX5422

## 2016-05-14 SURGERY — BREATH TEST, FOR HELICOBACTER PYLORI

## 2016-05-14 NOTE — Telephone Encounter (Signed)
Error. Please disregard

## 2016-05-14 NOTE — Telephone Encounter (Signed)
I have spoken to patient to advise of negative H Pylori breath test and recommendations for endoscopy evaluation. She verbalizes understanding. Endoscopy scheduled for 07/09/16 and previsit scheduled for 06/25/16. Patient verbalizes understanding of this as well.

## 2016-05-14 NOTE — Telephone Encounter (Signed)
===  View-only below this line===  ----- Message -----    From: Jerene Bears, MD    Sent: 05/14/2016   1:01 PM      To: Larina Bras, CMA Subject: FW: Inpatient Notes                            H. pylori breath test negative EGD recommended to further evaluate symptoms as discussed in clinic JMP

## 2016-05-14 NOTE — Progress Notes (Signed)
   05/14/16 1053  BREATH TEK ASSESSMENT  Sample 1 3.4  Sample 2 2.9  Test Negative

## 2016-05-14 NOTE — Telephone Encounter (Signed)
No show letter sent to patient

## 2016-05-15 ENCOUNTER — Encounter (HOSPITAL_COMMUNITY): Payer: Self-pay | Admitting: Internal Medicine

## 2016-05-31 DIAGNOSIS — H524 Presbyopia: Secondary | ICD-10-CM | POA: Diagnosis not present

## 2016-05-31 DIAGNOSIS — H353121 Nonexudative age-related macular degeneration, left eye, early dry stage: Secondary | ICD-10-CM | POA: Diagnosis not present

## 2016-05-31 DIAGNOSIS — H25813 Combined forms of age-related cataract, bilateral: Secondary | ICD-10-CM | POA: Diagnosis not present

## 2016-06-05 DIAGNOSIS — R05 Cough: Secondary | ICD-10-CM | POA: Diagnosis not present

## 2016-06-05 DIAGNOSIS — R51 Headache: Secondary | ICD-10-CM | POA: Diagnosis not present

## 2016-07-02 ENCOUNTER — Ambulatory Visit: Payer: PPO | Admitting: *Deleted

## 2016-07-02 VITALS — Ht 63.5 in | Wt 226.0 lb

## 2016-07-02 DIAGNOSIS — R110 Nausea: Secondary | ICD-10-CM

## 2016-07-02 NOTE — Progress Notes (Signed)
No egg or soy allergy  No anesthesia or intubation problems per pt  No diet medications taken   

## 2016-07-09 ENCOUNTER — Encounter: Payer: Self-pay | Admitting: Internal Medicine

## 2016-07-09 ENCOUNTER — Encounter: Payer: PPO | Admitting: Internal Medicine

## 2016-07-18 DIAGNOSIS — G4733 Obstructive sleep apnea (adult) (pediatric): Secondary | ICD-10-CM | POA: Diagnosis not present

## 2016-07-23 ENCOUNTER — Ambulatory Visit (AMBULATORY_SURGERY_CENTER): Payer: PPO | Admitting: Internal Medicine

## 2016-07-23 ENCOUNTER — Encounter: Payer: Self-pay | Admitting: Internal Medicine

## 2016-07-23 VITALS — BP 129/61 | HR 54 | Temp 98.0°F | Resp 16 | Ht 63.5 in | Wt 226.0 lb

## 2016-07-23 DIAGNOSIS — K2970 Gastritis, unspecified, without bleeding: Secondary | ICD-10-CM | POA: Diagnosis not present

## 2016-07-23 DIAGNOSIS — R110 Nausea: Secondary | ICD-10-CM

## 2016-07-23 MED ORDER — PANTOPRAZOLE SODIUM 40 MG PO TBEC: 40.0000 mg | DELAYED_RELEASE_TABLET | Freq: Every day | ORAL | 3 refills | Status: DC

## 2016-07-23 MED ORDER — SODIUM CHLORIDE 0.9 % IV SOLN: 500.0000 mL | INTRAVENOUS | Status: DC

## 2016-07-23 NOTE — Patient Instructions (Signed)
YOU HAD AN ENDOSCOPIC PROCEDURE TODAY AT [facility]:   Refer to the procedure report that was given to you for any specific questions about what was found during the examination.  If the procedure report does not answer your questions, please call your gastroenterologist to clarify.  If you requested that your care partner not be given the details of your procedure findings, then the procedure report has been included in a sealed envelope for you to review at your convenience later.  YOU SHOULD EXPECT: Some feelings of bloating in the abdomen. Passage of more gas than usual.  Walking can help get rid of the air that was put into your GI tract during the procedure and reduce the bloating. If you had a lower endoscopy (such as a colonoscopy or flexible sigmoidoscopy) you may notice spotting of blood in your stool or on the toilet paper. If you underwent a bowel prep for your procedure, you may not have a normal bowel movement for a few days.  Please Note:  You might notice some irritation and congestion in your nose or some drainage.  This is from the oxygen used during your procedure.  There is no need for concern and it should clear up in a day or so.  SYMPTOMS TO REPORT IMMEDIATELY:    Following upper endoscopy (EGD)  Vomiting of blood or coffee ground material  New chest pain or pain under the shoulder blades  Painful or persistently difficult swallowing  New shortness of breath  Fever of 100F or higher  Black, tarry-looking stools  For urgent or emergent issues, a gastroenterologist can be reached at any hour by calling [phone].   DIET:  We do recommend a small meal at first, but then you may proceed to your regular diet.  Drink plenty of fluids but you should avoid alcoholic beverages for 24 hours.  ACTIVITY:  You should plan to take it easy for the rest of today and you should NOT DRIVE or use heavy machinery until tomorrow (because of the sedation medicines used  during the test).    FOLLOW UP: Our staff will call the number listed on your records the next business day following your procedure to check on you and address any questions or concerns that you may have regarding the information given to you following your procedure. If we do not reach you, we will leave a message.  However, if you are feeling well and you are not experiencing any problems, there is no need to return our call.  We will assume that you have returned to your regular daily activities without incident.  If any biopsies were taken you will be contacted by phone or by letter within the next 1-3 weeks.  Please call us at [phone] if you have not heard about the biopsies in 3 weeks.    SIGNATURES/CONFIDENTIALITY: You and/or your care partner have signed paperwork which will be entered into your electronic medical record.  These signatures attest to the fact that that the information above on your After Visit Summary has been reviewed and is understood.  Full responsibility of the confidentiality of this discharge information lies with you and/or your care-partner.   Continue present medications. Esophagitis and gastritis handout provided. Hiatal hernia handout provided. Begin Protonix 40 mg by mouth daily ( 30 minutes before breakfast ). Return to GI clinic at next available appointment.

## 2016-07-23 NOTE — Op Note (Signed)
[facility] Patient Name: Sherry Bell Procedure Date: 07/23/2016 9:41 AM MRN: [id number] Endoscopist: Jerene Bears , MD Age: 70 Referring MD:  Date of Birth: 02/07/1946 Gender: Female Account #: [id number] Procedure:                Upper GI endoscopy Indications:              Previously treated for Helicobacter pylori, Nausea Medicines:                Monitored Anesthesia Care Procedure:                Pre-Anesthesia Assessment:                           - Prior to the procedure, a History and Physical                            was performed, and patient medications and                            allergies were reviewed. The patient's tolerance of                            previous anesthesia was also reviewed. The risks                            and benefits of the procedure and the sedation                            options and risks were discussed with the patient.                            All questions were answered, and informed consent                            was obtained. Prior Anticoagulants: The patient has                            taken no previous anticoagulant or antiplatelet                            agents. ASA Grade Assessment: II - A patient with                            mild systemic disease. After reviewing the risks                            and benefits, the patient was deemed in                            satisfactory condition to undergo the procedure.                           After obtaining informed consent, the endoscope was  passed under direct vision. Throughout the                            procedure, the patient's blood pressure, pulse, and                            oxygen saturations were monitored continuously. The                            Model GIF-[id number]) scope was introduced                            through the mouth, and advanced to the second part                            of  duodenum. The upper GI endoscopy was                            accomplished without difficulty. The patient                            tolerated the procedure well. Scope In: Scope Out: Findings:                 LA Grade A (one or more mucosal breaks less than 5                            mm, not extending between tops of 2 mucosal folds)                            esophagitis was found at the gastroesophageal                            junction.                           A 2 cm hiatal hernia was present.                           The exam of the esophagus was otherwise normal.                           Scattered moderate inflammation characterized by                            congestion (edema), erythema and linear erosions                            was found in the gastric body and in the gastric                            antrum. Biopsies were taken with a cold forceps for  histology and Helicobacter pylori testing.                           Localized nodular mucosa was found in the                            prepyloric region of the stomach. Multiple biopsies                            were obtained with cold forceps for histology and                            Helicobacter pylori testing in a targeted manner in                            the prepyloric region of the stomach.                           The examined duodenum was normal. Complications:            No immediate complications. Estimated Blood Loss:     Estimated blood loss was minimal. Impression:               - LA Grade A reflux esophagitis.                           - 2 cm hiatal hernia.                           - Gastritis. Biopsied.                           - Nodular mucosa in the prepyloric region of the                            stomach. Biopsied.                           - Normal examined duodenum. Recommendation:           - Patient has a contact number available for                             emergencies. The signs and symptoms of potential                            delayed complications were discussed with the                            patient. Return to normal activities tomorrow.                            Written discharge instructions were provided to the                            patient.                           -  Resume previous diet.                           - Continue present medications.                           - Begin Protonix (pantoprazole) 40 mg PO daily (30                            minutes before breakfast).                           - Await pathology results.                           - Return to GI clinic at the next available                            appointment. Jerene Bears, MD 07/23/2016 9:58:13 AM This report has been signed electronically.

## 2016-07-23 NOTE — Progress Notes (Signed)
Called to room to assist during endoscopic procedure.  Patient ID and intended procedure confirmed with present staff. Received instructions for my participation in the procedure from the performing physician.  

## 2016-07-23 NOTE — Progress Notes (Signed)
Report to PACU, RN, vss, BBS= Clear.  

## 2016-07-24 ENCOUNTER — Telehealth: Payer: Self-pay | Admitting: *Deleted

## 2016-07-24 NOTE — Telephone Encounter (Signed)
  Follow up Call-  Call back number 07/23/2016  Post procedure Call Back phone  # [phone]  Permission to leave phone message Yes  Some recent data might be hidden     Patient questions:  Do you have a fever, pain , or abdominal swelling? No. Pain Score  0 *  Have you tolerated food without any problems? Yes.    Have you been able to return to your normal activities? Yes.    Do you have any questions about your discharge instructions: Diet   No. Medications  No. Follow up visit  No.  Do you have questions or concerns about your Care? No.  Actions: * If pain score is 4 or above: No action needed, pain <4.

## 2016-07-27 ENCOUNTER — Encounter: Payer: Self-pay | Admitting: Internal Medicine

## 2016-08-14 ENCOUNTER — Other Ambulatory Visit (INDEPENDENT_AMBULATORY_CARE_PROVIDER_SITE_OTHER): Payer: PPO

## 2016-08-14 DIAGNOSIS — E038 Other specified hypothyroidism: Secondary | ICD-10-CM | POA: Diagnosis not present

## 2016-08-14 DIAGNOSIS — E063 Autoimmune thyroiditis: Secondary | ICD-10-CM

## 2016-08-14 LAB — T3, FREE: T3, Free: 2.6 pg/mL (ref 2.3–4.2)

## 2016-08-14 LAB — T4, FREE: Free T4: 0.68 ng/dL (ref 0.60–1.60)

## 2016-08-14 LAB — TSH: TSH: 1.99 u[IU]/mL (ref 0.35–4.50)

## 2016-08-16 ENCOUNTER — Other Ambulatory Visit: Payer: PPO

## 2016-08-21 ENCOUNTER — Ambulatory Visit (INDEPENDENT_AMBULATORY_CARE_PROVIDER_SITE_OTHER): Payer: PPO | Admitting: Endocrinology

## 2016-08-21 ENCOUNTER — Encounter: Payer: Self-pay | Admitting: Endocrinology

## 2016-08-21 VITALS — BP 135/64 | HR 61 | Temp 98.2°F | Resp 16 | Ht 63.5 in | Wt 229.8 lb

## 2016-08-21 DIAGNOSIS — E041 Nontoxic single thyroid nodule: Secondary | ICD-10-CM | POA: Diagnosis not present

## 2016-08-21 DIAGNOSIS — E063 Autoimmune thyroiditis: Secondary | ICD-10-CM

## 2016-08-21 DIAGNOSIS — E038 Other specified hypothyroidism: Secondary | ICD-10-CM

## 2016-08-21 NOTE — Progress Notes (Signed)
Patient ID: Sherry Bell, female   DOB: September 04, 1946, 70 y.o.   MRN: [id number]           Reason for Appointment: Follow-up of hypothyroidism and of thyroid nodule    History of Present Illness:   THYROID cyst:  The patient's thyroid nodule was first discovered in early 2016 soon after she had a respiratory infection in 11/2014 She had felt a swelling on the left side of her neck and was seen by her PCP for evaluation in 3/16 She did not have any swelling prior to that.  She does not feel that her neck swelling has increased in size and it may have moved more towards the center of the neck  The thyroid ultrasound showed the following Dominant simple appearing cyst occupies much of the left lobe and measures approximately 5.2 x 5.4 x 4.6 cm.  This cyst does not have any complex features or mural nodularity. On review of her previous records she had a predominantly cystic lesion in the upper pole of her thyroid measuring 1.7 cm in 2003 This was biopsied in 2002 and was benign Also she had a subcentimeter cyst in the lower left pole of the left side in 2003  Individual and she had a increase in size of her left thyroid nodule with pressure sensation Aspiration of this cyst was successfully done in June by Dr. Loanne Drilling and about 100 mL of fluid removed which was negative for cytology She does not feel any recurrence of the left-sided swelling or pressure   HYPOTHYROIDISM:  About 15-20 years ago the patient had gone to her physician because of weight gain. She does not think she had any unusual fatigue at that time She was found to be hypothyroid and has been on thyroid hormones since then.  She does not think she felt any different with taking the medication but her weight stabilized.  Her previous endocrinologist put her on combination of Synthroid and Cytomel 1/2 bid  Again she has been only taking her Cytomel in the morning since she forgets in the evening She thinks her medication  makes her sleepy after she takes this but she will sometimes get sleepy in the afternoon and also She also had the same symptom in our last visit However her TSH is been consistently normal  Free T3 is low normal but she did not take her medications before the visit in the morning Currently on Synthroid 100 g and Cytomel 12.5 g    Lab Results  Component Value Date   FREET4 0.68 08/14/2016   FREET4 0.87 04/17/2016   FREET4 0.84 04/16/2016   TSH 1.99 08/14/2016   TSH 1.685 04/17/2016   TSH 2.37 04/16/2016      Medication List       Accurate as of 08/21/16 11:07 AM. Always use your most recent med list.          atenolol 50 MG tablet Commonly known as:  TENORMIN Take 50 mg by mouth daily.   cholecalciferol 1000 units tablet Commonly known as:  VITAMIN D Take 5,000 Units by mouth daily.   levothyroxine 100 MCG tablet Commonly known as:  SYNTHROID, LEVOTHROID Take 1 tablet (100 mcg total) by mouth daily.   liothyronine 25 MCG tablet Commonly known as:  CYTOMEL Take 12.5 mcg by mouth 2 (two) times daily.   Magnesium 300 MG Caps Take 1 capsule by mouth daily. Reported on 02/14/2016   pantoprazole 40 MG tablet Commonly known as:  PROTONIX Take  1 tablet (40 mg total) by mouth daily. Take 30 minutes before breakfast       Allergies:  Allergies  Allergen Reactions  . Pneumococcal 13-Val Conj Vacc Other (See Comments)  . Codeine Nausea And Vomiting, Rash and Nausea Only  . Diphth-Acell Pertussis-Tetanus Palpitations    Past Medical History:  Diagnosis Date  . Allergy   . Arthritis   . Cataract   . Cystocele   . Fibroids   . Gastric reflux   . Generalized headaches   . H. pylori infection    8 years ago  . Heart murmur    Mild mitral regurgitation- pt denies this 07-02-16 at [facility]  . Hypertension   . Obstructive sleep apnea    uses CPAP  . Pain in limb   . Sleep apnea    uses CPAP  . Thyroid disease   . Varicose veins     There is no history of  radiation to the neck in childhood  Past Surgical History:  Procedure Laterality Date  . BREATH TEK H PYLORI N/A 05/14/2016   Procedure: BREATH TEK H PYLORI;  Surgeon: Jerene Bears, MD;  Location: [facility];  Service: Gastroenterology;  Laterality: N/A;  . COLONOSCOPY     Dr Lajoyce Corners  . ESOPHAGOGASTRODUODENOSCOPY     Dr Lajoyce Corners  . TONSILECTOMY, ADENOIDECTOMY, BILATERAL MYRINGOTOMY AND TUBES      Family History  Problem Relation Age of Onset  . Throat cancer Father     worked at [employer]  . Hypothyroidism Sister   . Colon cancer Neg Hx   . Esophageal cancer Neg Hx   . Rectal cancer Neg Hx   . Stomach cancer Neg Hx     Social History:  reports that she has never smoked. She has never used smokeless tobacco. She reports that she does not drink alcohol or use drugs.   Review of Systems:      She has had sleep apnea diagnosed in 2013 using CPAP Still has some daytime sleepiness         Examination:   BP 135/64   Pulse 61   Temp 98.2 F (36.8 C)   Resp 16   Ht 5' 3.5" (1.613 m)   Wt 229 lb 12.8 oz (104.2 kg)   SpO2 95%   BMI 40.07 kg/m        Thyroid nodule is not palpable on the left side Right lobe was not palpable Biceps reflexes are normal   Assessment/Plan:  Thyroid nodule: She has a simple cyst on the left side which was completely resolved with aspiration and has not recurred so far    HYPOTHYROIDISM: She is complaining about sleepiness but this is not new and her TSH is consistently normal now Does not think that her sleepiness is related to her hypothyroidism even though she thinks she feels sleepy after taking her medication in the morning Free T3 is low normal but this was done in the morning without her medication She'll continue the same regimen and follow up in 6 months   FATIGUE and somnolence: She will follow-up with her sleep disorder specialist    [facility] 08/21/2016

## 2016-08-21 NOTE — Patient Instructions (Signed)
No change 

## 2016-08-23 ENCOUNTER — Other Ambulatory Visit: Payer: Self-pay | Admitting: Endocrinology

## 2016-09-03 ENCOUNTER — Telehealth: Payer: Self-pay | Admitting: Internal Medicine

## 2016-09-03 NOTE — Telephone Encounter (Signed)
Let pt know that the first available appt on a Monday or Tuesday with Dr. Hilarie Fredrickson is 11/27/16 and that she can call and reschedule her appt if that works for her. Dr. Hilarie Fredrickson does not have anything available sooner.

## 2016-09-27 ENCOUNTER — Encounter: Payer: Self-pay | Admitting: *Deleted

## 2016-10-03 ENCOUNTER — Encounter: Payer: Self-pay | Admitting: Internal Medicine

## 2016-10-03 ENCOUNTER — Ambulatory Visit (INDEPENDENT_AMBULATORY_CARE_PROVIDER_SITE_OTHER): Payer: PPO | Admitting: Internal Medicine

## 2016-10-03 VITALS — BP 132/70 | HR 68 | Ht 63.5 in | Wt 228.6 lb

## 2016-10-03 DIAGNOSIS — K2100 Gastro-esophageal reflux disease with esophagitis, without bleeding: Secondary | ICD-10-CM

## 2016-10-03 DIAGNOSIS — R1013 Epigastric pain: Secondary | ICD-10-CM

## 2016-10-03 MED ORDER — RANITIDINE HCL 150 MG PO TABS: 150.0000 mg | ORAL_TABLET | Freq: Two times a day (BID) | ORAL | 3 refills | Status: DC

## 2016-10-03 NOTE — Patient Instructions (Signed)
Take Ranitidine 150mg  twice a day. Discontinue Protonix. Please follow up in 3 months, sooner if you need to. We are giving you a GERD diet today to follow. Call us with the ingredients contained in [facility].

## 2016-10-03 NOTE — Progress Notes (Signed)
Subjective:    Patient ID: Sherry Bell, female    DOB: 1946/07/15, 70 y.o.   MRN: [id number]  HPI Sherry Bell is a 70 year old female with a past medical history of GERD, esophagitis, gastritis, hyperplastic gastric polyp,History of H. pylori status post treatment, sleep apnea, hypertension who is here for follow-up. I saw her in the office in June 2017 to discuss chronic nausea. This led to an upper endoscopy which was performed on 07/23/2016. This showed LA grade a esophagitis, 2 cm hiatal hernia, moderate inflammation with linear erosion in the stomach and nodular mucosa in the prepyloric region. Biopsies of the prepyloric nodule showed a hyperplastic gastric polyp without adenomatous epithelium or malignancy. There is no H. pylori or metaplasia or active inflammation in the remaining stomach biopsies. Recommend pantoprazole 40 mg daily and then follow-up.  She reports she took pantoprazole for 1 month and this helped with her nausea and also reflux and burning upper abdominal pain. She stopped this because she was concerned about kidney failure. She's also try to avoid overeating which helps her symptoms. She does report early fullness with upper abdominal pressure and nausea which is slightly worse after stopping pantoprazole. She's recent restarted a product called "Zpan" on advice from her sister which is supposedly to help acid and burning upper abdominal pain. She tells me she started this this week and can begin with 2 pills working her way up to 6 pills daily. She is unsure of the active ingredient. Her bowel movements have remained normal.   Review of Systems As per history of present illness, otherwise negative    Objective:   Physical Exam  BP 132/70 (BP Location: Left Arm, Patient Position: Sitting, Cuff Size: Normal)   Pulse 68   Ht 5' 3.5" (1.613 m)   Wt 228 lb 9.6 oz (103.7 kg)   BMI 39.86 kg/m  Constitutional: Well-developed and well-nourished. No distress. HEENT:  Normocephalic and atraumatic.Marland Kitchen Conjunctivae are normal.  No scleral icterus. Neck: Neck supple. Trachea midline. Cardiovascular: Normal rate, regular rhythm and intact distal pulses. No M/R/G Pulmonary/chest: Effort normal and breath sounds normal. No wheezing, rales or rhonchi. Abdominal: Soft, nontender, nondistended. Bowel sounds active throughout.  Extremities: no clubbing, cyanosis, or edema Neurological: Alert and oriented to person place and time. Skin: Skin is warm and dry. Psychiatric: Normal mood and affect. Behavior is normal.  CBC    Component Value Date/Time   WBC 13.5 (H) 04/17/2016 0057   RBC 4.34 04/17/2016 0057   HGB 13.2 04/17/2016 0057   HCT 38.6 04/17/2016 0057   PLT 199 04/17/2016 0057   MCV 88.9 04/17/2016 0057   MCH 30.4 04/17/2016 0057   MCHC 34.2 04/17/2016 0057   RDW 12.9 04/17/2016 0057   LYMPHSABS 1.1 04/17/2016 0057   MONOABS 0.6 04/17/2016 0057   EOSABS 0.1 04/17/2016 0057   BASOSABS 0.0 04/17/2016 0057   CMP     Component Value Date/Time   NA 136 04/17/2016 0057   K 4.5 04/17/2016 0057   CL 103 04/17/2016 0057   CO2 25 04/17/2016 0057   GLUCOSE 162 (H) 04/17/2016 0057   BUN 16 04/17/2016 0057   CREATININE 0.91 04/17/2016 0057   CALCIUM 9.1 04/17/2016 0057   PROT 7.6 04/17/2016 0057   ALBUMIN 4.3 04/17/2016 0057   AST 20 04/17/2016 0057   ALT 20 04/17/2016 0057   ALKPHOS 49 04/17/2016 0057   BILITOT 0.8 04/17/2016 0057   GFRNONAA >60 04/17/2016 0057   GFRAA >60 04/17/2016 0057  Assessment & Plan:   70 year old female with a past medical history of GERD, esophagitis, gastritis, hyperplastic gastric polyp,History of H. pylori status post treatment, sleep apnea, hypertension who is here for follow-up.  1. GERD/functional dyspepsia -- her symptoms overlap between that of GERD and dyspepsia. I think she would do better with acid suppression rather than without. She is concerned about PPI side effects though I have tried to reassure her  about PPI and renal dysfunction. I will start her on ranitidine 150 mg twice a day. Continue GERD diet and avoid overeating. I asked that she notify me as to the active ingredient of the product she recently started so that I can try to ensure this is safe for her. Will reassess in 3-4 months in clinic and if symptoms persist consider gastric emptying scan.  2. Hyperplastic gastric polyp -- repeat EGD in 1 year for reassessment and to ensure no dramatic increase in size.  3. Colon cancer screening, due early 2018 -- discuss scheduling screening colonoscopy when she follows up  25 minutes spent with the patient today. Greater than 50% was spent in counseling and coordination of care with the patient

## 2016-12-06 DIAGNOSIS — H10413 Chronic giant papillary conjunctivitis, bilateral: Secondary | ICD-10-CM | POA: Diagnosis not present

## 2016-12-06 DIAGNOSIS — H353121 Nonexudative age-related macular degeneration, left eye, early dry stage: Secondary | ICD-10-CM | POA: Diagnosis not present

## 2016-12-06 DIAGNOSIS — H524 Presbyopia: Secondary | ICD-10-CM | POA: Diagnosis not present

## 2016-12-06 DIAGNOSIS — H25813 Combined forms of age-related cataract, bilateral: Secondary | ICD-10-CM | POA: Diagnosis not present

## 2017-01-01 ENCOUNTER — Telehealth: Payer: Self-pay | Admitting: Internal Medicine

## 2017-01-01 ENCOUNTER — Ambulatory Visit: Payer: PPO | Admitting: Internal Medicine

## 2017-01-01 NOTE — Telephone Encounter (Signed)
No charge. 

## 2017-01-13 IMAGING — US US SOFT TISSUE HEAD/NECK
1 series · 13 of 25 positions shown · non-contrast
Comparison: None.

CLINICAL DATA: Neck fullness.

EXAM:
THYROID ULTRASOUND
TECHNIQUE: Ultrasound examination of the thyroid gland and adjacent soft
tissues was performed.

[Series 1: us soft tissue head/neck · 0.08mm/px · 13 of 44 slices shown]
[im 1/44]
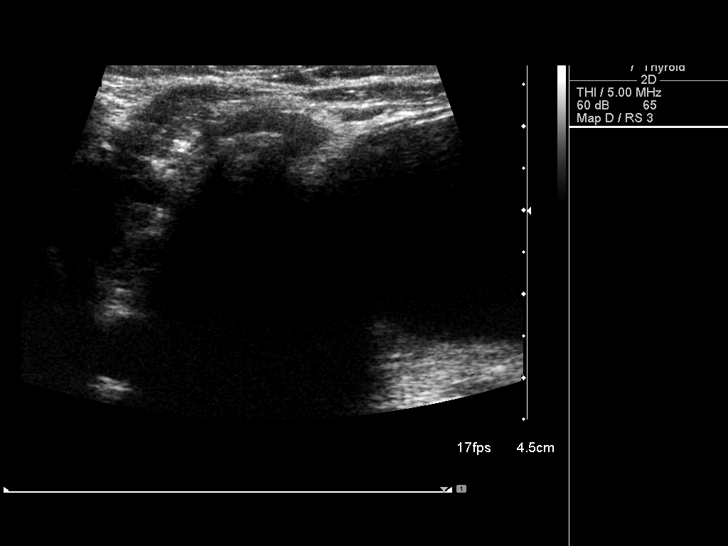
[im 4/44]
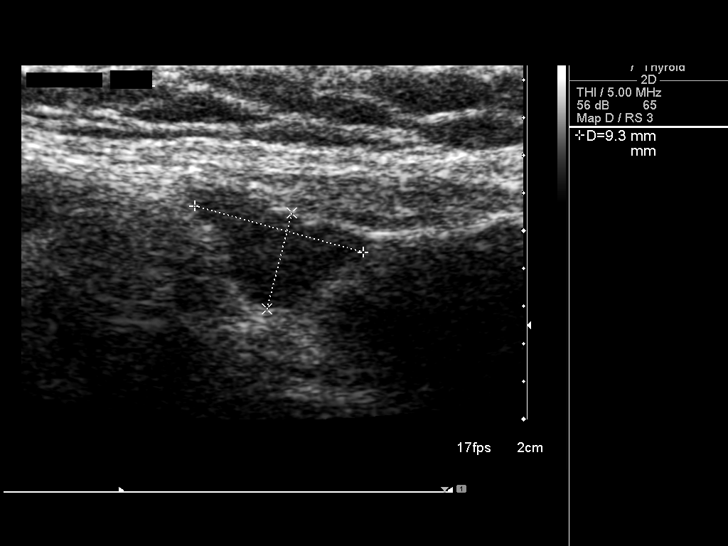
[im 8/44]
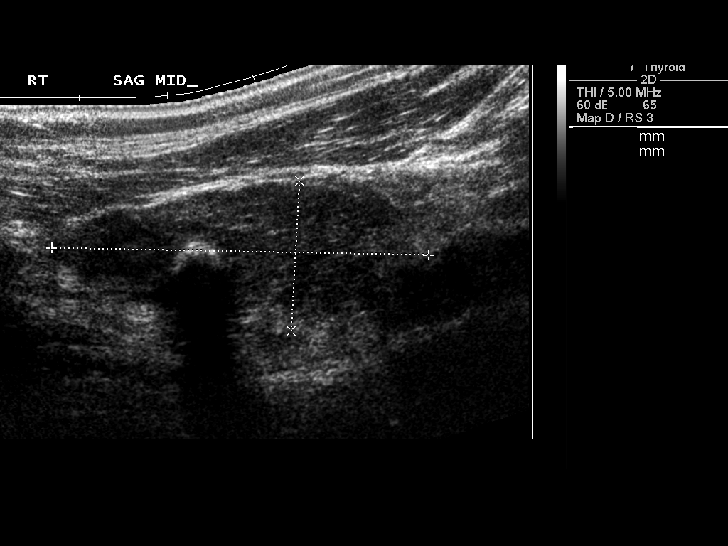
[im 11/44]
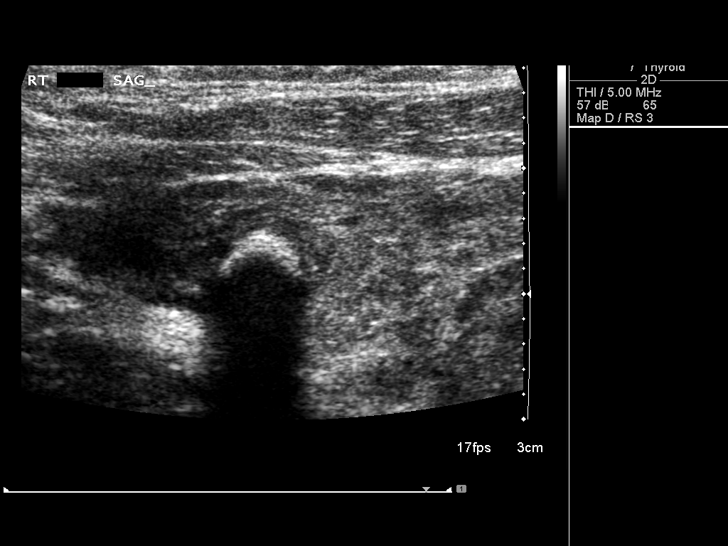
[im 15/44]
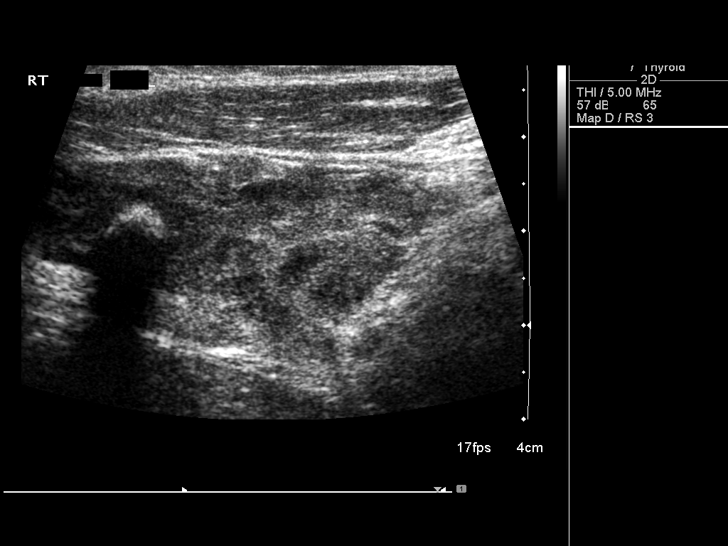
[im 18/44]
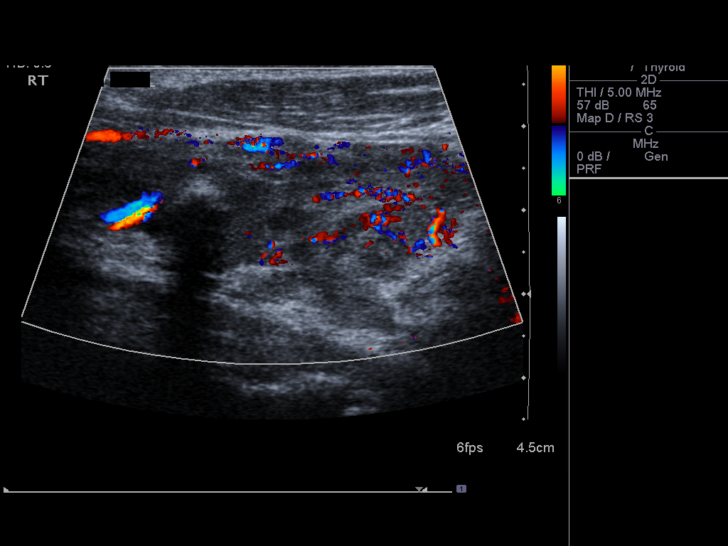
[im 22/44]
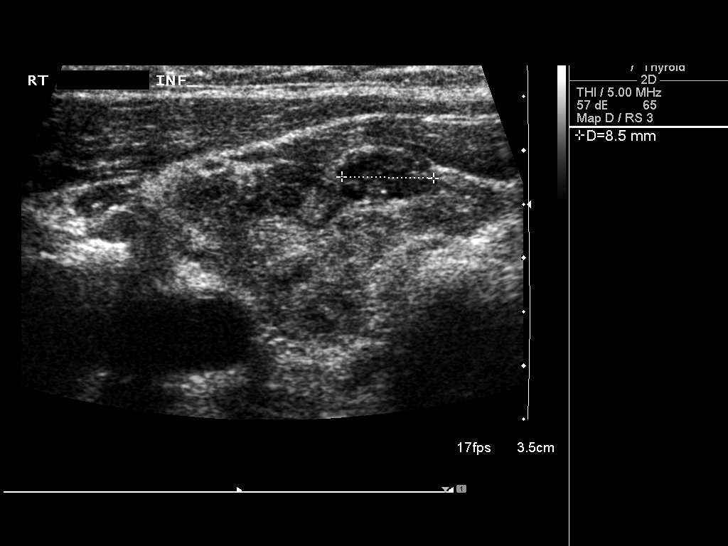
[im 26/44]
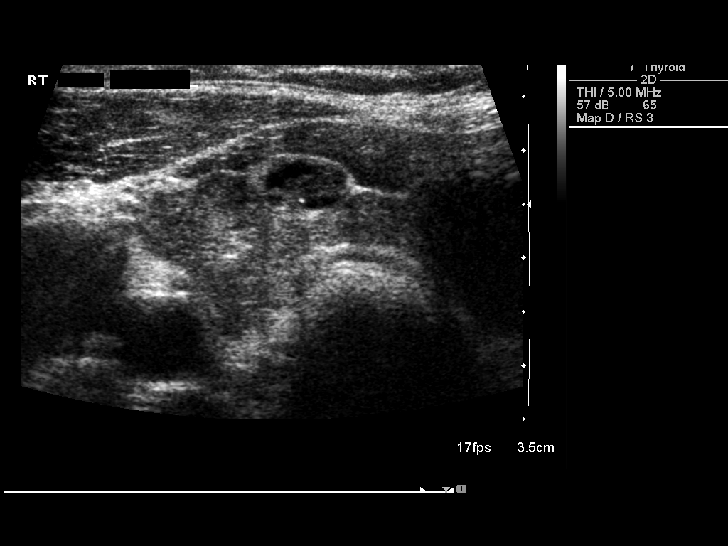
[im 29/44]
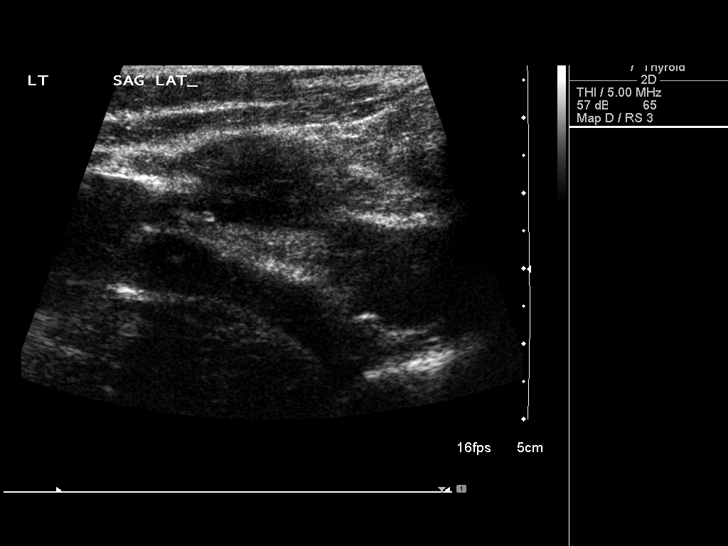
[im 33/44]
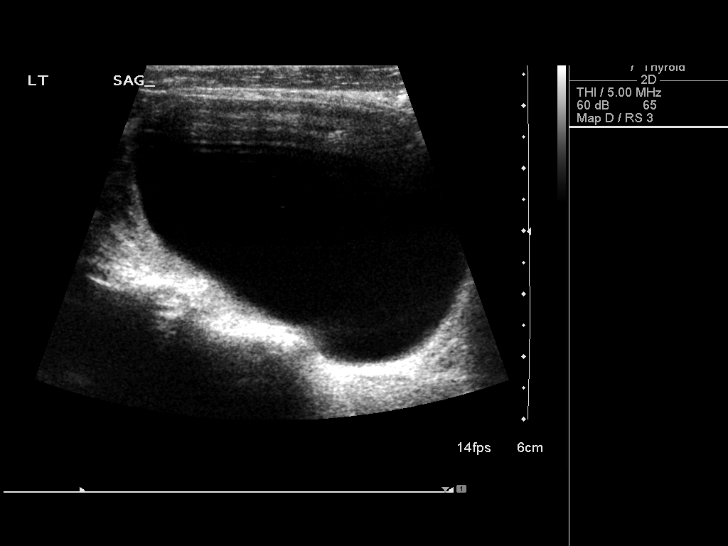
[im 36/44]
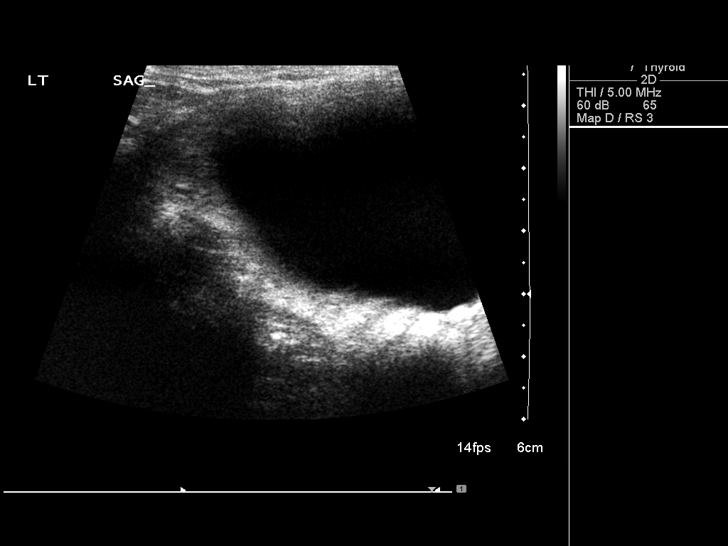
[im 40/44]
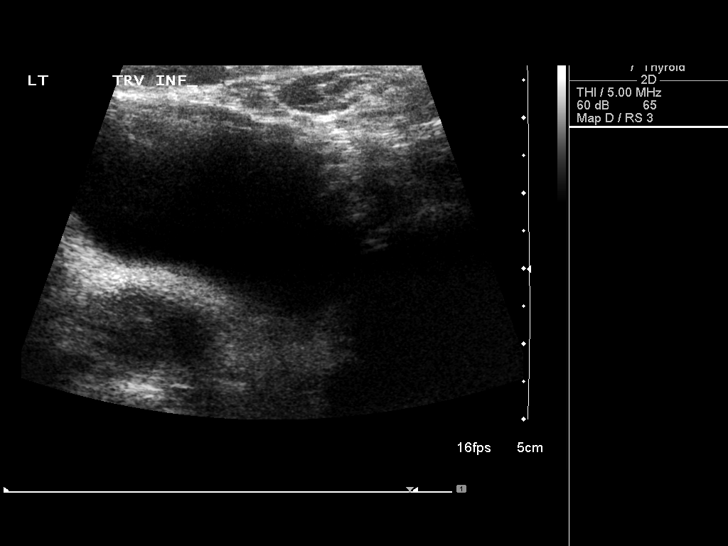
[im 44/44]
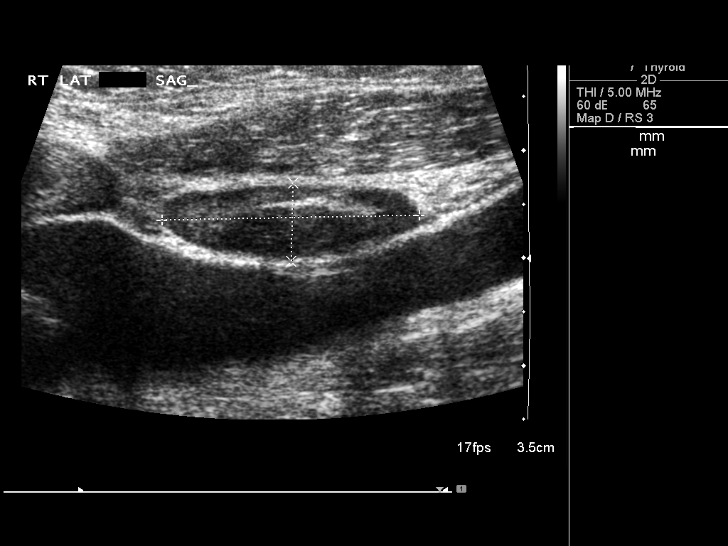

[13 of 25 positions shown; findings below may reference images not displayed]

FINDINGS: Right thyroid lobe

Measurements: 4.3 x 1.7 x 1.6 cm. Shadowing dystrophic calcification
in the superior right lobe measures approximately 0.8 cm in
diameter. This appears benign. Small hypoechoic solid nodule in the
inferior right lobe measures approximately 0.9 x 0.5 x 0.9 cm. The
right lobe is very heterogeneous in echotexture and multinodular in
appearance.

Left thyroid lobe

Measurements: 6.8 x 3.8 x 4.9 cm. Dominant simple appearing cyst
occupies much of the right lobe and measures approximately 5.2 x
x 4.6 cm. This cyst does not have any complex features or mural
nodularity.

Isthmus

Thickness: 0.6 cm. Hypoechoic nodule in the left portion of the
isthmus measures approximately 0.9 x 0.5 x 0.7 cm.

Lymphadenopathy

None visualized.
IMPRESSION: Multinodular right lobe with dystrophic calcification and small
nodule present. Enlarged left lobe secondary to a large simple
appearing thyroid cyst. Although likely benign by appearance, if the
patient is symptomatic, aspiration of the left thyroid cyst under
ultrasound guidance could be considered.

## 2017-02-12 ENCOUNTER — Other Ambulatory Visit (INDEPENDENT_AMBULATORY_CARE_PROVIDER_SITE_OTHER): Payer: PPO

## 2017-02-12 DIAGNOSIS — E063 Autoimmune thyroiditis: Secondary | ICD-10-CM

## 2017-02-12 DIAGNOSIS — E038 Other specified hypothyroidism: Secondary | ICD-10-CM

## 2017-02-12 LAB — T4, FREE: Free T4: 0.83 ng/dL (ref 0.60–1.60)

## 2017-02-12 LAB — TSH: TSH: 2.47 u[IU]/mL (ref 0.35–4.50)

## 2017-02-12 LAB — T3, FREE: T3, Free: 3.4 pg/mL (ref 2.3–4.2)

## 2017-02-13 ENCOUNTER — Other Ambulatory Visit: Payer: PPO

## 2017-02-16 ENCOUNTER — Other Ambulatory Visit: Payer: Self-pay | Admitting: Endocrinology

## 2017-02-18 ENCOUNTER — Encounter: Payer: Self-pay | Admitting: Internal Medicine

## 2017-02-18 ENCOUNTER — Ambulatory Visit (INDEPENDENT_AMBULATORY_CARE_PROVIDER_SITE_OTHER): Payer: PPO | Admitting: Internal Medicine

## 2017-02-18 ENCOUNTER — Ambulatory Visit (INDEPENDENT_AMBULATORY_CARE_PROVIDER_SITE_OTHER): Payer: PPO | Admitting: Endocrinology

## 2017-02-18 ENCOUNTER — Encounter: Payer: Self-pay | Admitting: Endocrinology

## 2017-02-18 VITALS — BP 120/66 | HR 96 | Ht 62.6 in | Wt 233.5 lb

## 2017-02-18 VITALS — BP 136/74 | HR 81 | Ht 64.0 in | Wt 233.0 lb

## 2017-02-18 DIAGNOSIS — K317 Polyp of stomach and duodenum: Secondary | ICD-10-CM | POA: Diagnosis not present

## 2017-02-18 DIAGNOSIS — K30 Functional dyspepsia: Secondary | ICD-10-CM

## 2017-02-18 DIAGNOSIS — E038 Other specified hypothyroidism: Secondary | ICD-10-CM | POA: Diagnosis not present

## 2017-02-18 DIAGNOSIS — K219 Gastro-esophageal reflux disease without esophagitis: Secondary | ICD-10-CM

## 2017-02-18 DIAGNOSIS — E063 Autoimmune thyroiditis: Secondary | ICD-10-CM

## 2017-02-18 DIAGNOSIS — Z1211 Encounter for screening for malignant neoplasm of colon: Secondary | ICD-10-CM | POA: Diagnosis not present

## 2017-02-18 MED ORDER — RANITIDINE HCL 150 MG PO TABS: 150.0000 mg | ORAL_TABLET | Freq: Two times a day (BID) | ORAL | 3 refills | Status: DC

## 2017-02-18 NOTE — Progress Notes (Signed)
Patient ID: Sherry Bell, female   DOB: 11/24/1946, 71 y.o.   MRN: [id number]           Reason for Appointment: Follow-up of hypothyroidism and of thyroid cyst    History of Present Illness:   THYROID cyst:  The patient's thyroid nodule was first discovered in early 2016 soon after she had a respiratory infection in 11/2014 She had felt a swelling on the left side of her neck and was seen by her PCP for evaluation in 3/16 She did not have any swelling prior to that.  She does not feel that her neck swelling has increased in size and it may have moved more towards the center of the neck  The thyroid ultrasound showed the following Dominant simple appearing cyst occupies much of the left lobe and measures approximately 5.2 x 5.4 x 4.6 cm.  This cyst does not have any complex features or mural nodularity. On review of her previous records she had a predominantly cystic lesion in the upper pole of her thyroid measuring 1.7 cm in 2003 This was biopsied in 2002 and was benign Also she had a subcentimeter cyst in the lower left pole of the left side in 2003  RECENT history: Subsequently she had a increase in size of her left thyroid nodule with pressure sensation Aspiration of this cyst was successfully done in June and 17 by Dr. Loanne Drilling and about 100 mL of fluid removed which was negative for cytology  She does not feel any local swelling coming back since then   HYPOTHYROIDISM:  About 15-20 years ago the patient had gone to her physician because of weight gain. She does not think she had any unusual fatigue at that time She was found to be hypothyroid and has been on thyroid hormones since then.  She does not think she felt any different with taking the medication but her weight stabilized.  Her previous endocrinologist put her on combination of Synthroid and Cytomel 1/2 tablet, previously taking this twice a day   For some time she has only taking her Cytomel in the morning since  she forgets in the evening She still complains of feeling tired and sleepy though she is using CPAP for sleep apnea  Currently on Synthroid 100 g and Cytomel 12.5 g She takes this very regularly Although her feet T3 level was relatively low in the morning on the Previous labs it is now quite normal even without her medication prior to the lab TSH is again normal   Lab Results  Component Value Date   FREET4 0.83 02/12/2017   FREET4 0.68 08/14/2016   FREET4 0.87 04/17/2016   TSH 2.47 02/12/2017   TSH 1.99 08/14/2016   TSH 1.685 04/17/2016   Lab Results  Component Value Date   T3FREE 3.4 02/12/2017   T3FREE 2.6 08/14/2016   T3FREE 3.0 04/16/2016    Allergies as of 02/18/2017      Reactions   Pneumococcal 13-val Conj Vacc Other (See Comments)   Codeine Nausea And Vomiting, Rash, Nausea Only   Diphth-acell Pertussis-tetanus Palpitations      Medication List       Accurate as of 02/18/17 10:35 AM. Always use your most recent med list.          atenolol 50 MG tablet Commonly known as:  TENORMIN Take 50 mg by mouth daily.   cholecalciferol 1000 units tablet Commonly known as:  VITAMIN D Take 5,000 Units by mouth daily.   liothyronine  25 MCG tablet Commonly known as:  CYTOMEL Take 12.5 mcg by mouth 2 (two) times daily.   losartan 50 MG tablet Commonly known as:  COZAAR Take 50 mg by mouth daily.   Magnesium 300 MG Caps Take 1 capsule by mouth daily. Reported on 02/14/2016   ranitidine 150 MG tablet Commonly known as:  ZANTAC Take 1 tablet (150 mg total) by mouth 2 (two) times daily.   SYNTHROID 100 MCG tablet Generic drug:  levothyroxine TAKE 1 TABLET BY MOUTH EVERY DAY       Allergies:  Allergies  Allergen Reactions  . Pneumococcal 13-Val Conj Vacc Other (See Comments)  . Codeine Nausea And Vomiting, Rash and Nausea Only  . Diphth-Acell Pertussis-Tetanus Palpitations    Past Medical History:  Diagnosis Date  . Allergy   . Arthritis   . Cataract    . Cystocele   . Fibroids   . Gastric reflux   . Generalized headaches   . H. pylori infection    8 years ago  . Heart murmur    Mild mitral regurgitation- pt denies this 07-02-16 at [facility]  . Hiatal hernia   . Hypertension   . Obstructive sleep apnea    uses CPAP  . Pain in limb   . Sleep apnea    uses CPAP  . Thyroid disease   . Varicose veins     There is no history of radiation to the neck in childhood  Past Surgical History:  Procedure Laterality Date  . BREATH TEK H PYLORI N/A 05/14/2016   Procedure: BREATH TEK H PYLORI;  Surgeon: Jerene Bears, MD;  Location: [facility];  Service: Gastroenterology;  Laterality: N/A;  . COLONOSCOPY     Dr Lajoyce Corners  . ESOPHAGOGASTRODUODENOSCOPY     Dr Lajoyce Corners  . TONSILECTOMY, ADENOIDECTOMY, BILATERAL MYRINGOTOMY AND TUBES      Family History  Problem Relation Age of Onset  . Throat cancer Father     worked at [employer]  . Hypothyroidism Sister   . Colon cancer Neg Hx   . Esophageal cancer Neg Hx   . Rectal cancer Neg Hx   . Stomach cancer Neg Hx     Social History:  reports that she has never smoked. She has never used smokeless tobacco. She reports that she does not drink alcohol or use drugs.   Review of Systems:      She has had sleep apnea diagnosed in 2013 using CPAP         Examination:   BP 136/74   Pulse 81   Ht 5\' 4"  (1.626 m)   Wt 233 lb (105.7 kg)   SpO2 97%   BMI 39.99 kg/m        Has minimal fullness in the left side felt with difficulty Right-sided just palpable on swallowing  Biceps reflexes are normal   Assessment/Plan:  Thyroid nodule: She has had a simple cyst on the left side which was completely resolved with aspiration in June 2017 and has not recurred She has minimal thyroid enlargement on exam likely to be from [place] thyroiditis  HYPOTHYROIDISM: She has had nonspecific fatigue and sleepiness However thyroid levels including T3 are quite normal She will continue the same regimen with  taking Cytomel half tablet in the morning   FATIGUE and somnolence: Unlikely to be related to her thyroid and she will need to work with her sleep disorder specialist for PCP to resolve this  [facility] 02/18/2017

## 2017-02-18 NOTE — Patient Instructions (Signed)
We have sent the following medications to your pharmacy for you to pick up at your convenience: Ranitidine twice daily as needed  You will be due for endoscopy and colonoscopy in August 2018. You will get a reminder when it is closer to that time.  If you are age 69 or older, your body mass index should be between 23-30. Your Body mass index is 41.9 kg/m. If this is out of the aforementioned range listed, please consider follow up with your Primary Care Provider.  If you are age 12 or younger, your body mass index should be between 19-25. Your Body mass index is 41.9 kg/m. If this is out of the aformentioned range listed, please consider follow up with your Primary Care Provider.

## 2017-02-19 NOTE — Progress Notes (Signed)
   Subjective:    Patient ID: Sherry Bell, female    DOB: 08-09-1946, 71 y.o.   MRN: [id number]  HPI Sherry Bell is a 71 year old female with a history of GERD, esophagitis and gastritis, functional dyspepsia, hyperplastic gastric polyp, H. pylori status post treatment, sleep apnea and hypertension who is here for follow-up. She was last seen in November 2017. She is here alone today. After her last visit we changed her to twice a day ranitidine over concern for PPI side effect, specifically renal dysfunction. She reports that she has been doing very well. She reports overall improvement in her reflux and dyspeptic symptoms. She is now using ranitidine 150 twice a day more on an as-needed basis. For her this is usually 1-3 days per week. She reports it works very well. She denies abdominal pain. Denies trouble swallowing. Denies nausea. Bowel habits a been regular without blood or melena. She does report intermittent issues with hemorrhoids which she states can lead to discomfort and prolapse particularly if her stools become hard or large. She reports she can see scanned blood on tissue with wiping on occasion.  Review of Systems As per history of present illness, otherwise negative  Current Medications, Allergies, Past Medical History, Past Surgical History, Family History and Social History were reviewed in [employer] record.     Objective:   Physical Exam BP 120/66 (BP Location: Left Arm, Patient Position: Sitting, Cuff Size: Normal)   Pulse 96   Ht 5' 2.6" (1.59 m) Comment: height measured without shoes  Wt 233 lb 8 oz (105.9 kg)   BMI 41.90 kg/m  Constitutional: Well-developed and well-nourished. No distress. HEENT: Normocephalic and atraumatic.  Conjunctivae are normal.  No scleral icterus. Neck: Neck supple. Trachea midline. Cardiovascular: Normal rate, regular rhythm and intact distal pulses. No M/R/G Pulmonary/chest: Effort normal and breath sounds  normal. No wheezing, rales or rhonchi. Abdominal: Soft, nontender, nondistended. Bowel sounds active throughout.   Extremities: no clubbing, cyanosis, or edema Neurological: Alert and oriented to person place and time. Skin: Skin is warm and dry.   Psychiatric: Normal mood and affect. Behavior is normal.      Assessment & Plan:  71 year old female with a history of GERD, esophagitis and gastritis, functional dyspepsia, hyperplastic gastric polyp, H. pylori status post treatment, sleep apnea and hypertension who is here for follow-up.   1. GERD/functional dyspepsia -- currently doing very well with as needed ranitidine. We'll continue ranitidine 150 twice a day when necessary  2. Hyperplastic gastric polyp -- I recommended repeat upper endoscopy in the coming months to reexamine the antral hyperplastic polyp and to ensure this is not increasing in size. Hyperplastic gastric polyps are rarely precancerous but do have a small precancerous potential. For this reason I have recommended surveillance. We discussed the risk benefits and alternatives and she is agreeable to proceeding. Her H. pylori status was negative at last check  3. Colon cancer screening -- also due this year as last colonoscopy 10 years ago at Dr. Lajoyce Corners. We discussed performing EGD and colonoscopy on the same day and she is agreeable to proceed though she would like to do this in August on a Monday morning if possible. We will try to accommodate her request. We discussed the risks, benefits, and alternatives and she wishes to proceed  25 minutes spent with the patient today. Greater than 50% was spent in counseling and coordination of care with the patient

## 2017-03-06 DIAGNOSIS — G4733 Obstructive sleep apnea (adult) (pediatric): Secondary | ICD-10-CM | POA: Diagnosis not present

## 2017-03-20 DIAGNOSIS — R0602 Shortness of breath: Secondary | ICD-10-CM | POA: Diagnosis not present

## 2017-03-20 DIAGNOSIS — G4733 Obstructive sleep apnea (adult) (pediatric): Secondary | ICD-10-CM | POA: Diagnosis not present

## 2017-03-20 DIAGNOSIS — I10 Essential (primary) hypertension: Secondary | ICD-10-CM | POA: Diagnosis not present

## 2017-03-21 DIAGNOSIS — M25562 Pain in left knee: Secondary | ICD-10-CM | POA: Diagnosis not present

## 2017-03-21 DIAGNOSIS — M25569 Pain in unspecified knee: Secondary | ICD-10-CM | POA: Diagnosis not present

## 2017-03-21 DIAGNOSIS — R899 Unspecified abnormal finding in specimens from other organs, systems and tissues: Secondary | ICD-10-CM | POA: Diagnosis not present

## 2017-03-21 DIAGNOSIS — R5383 Other fatigue: Secondary | ICD-10-CM | POA: Diagnosis not present

## 2017-03-21 DIAGNOSIS — R419 Unspecified symptoms and signs involving cognitive functions and awareness: Secondary | ICD-10-CM | POA: Diagnosis not present

## 2017-03-21 DIAGNOSIS — M25561 Pain in right knee: Secondary | ICD-10-CM | POA: Diagnosis not present

## 2017-03-25 DIAGNOSIS — I10 Essential (primary) hypertension: Secondary | ICD-10-CM | POA: Diagnosis not present

## 2017-03-25 DIAGNOSIS — R0602 Shortness of breath: Secondary | ICD-10-CM | POA: Diagnosis not present

## 2017-04-09 DIAGNOSIS — R0602 Shortness of breath: Secondary | ICD-10-CM | POA: Diagnosis not present

## 2017-04-09 DIAGNOSIS — I10 Essential (primary) hypertension: Secondary | ICD-10-CM | POA: Diagnosis not present

## 2017-04-17 DIAGNOSIS — M170 Bilateral primary osteoarthritis of knee: Secondary | ICD-10-CM | POA: Diagnosis not present

## 2017-04-29 DIAGNOSIS — R5383 Other fatigue: Secondary | ICD-10-CM | POA: Diagnosis not present

## 2017-04-29 DIAGNOSIS — I10 Essential (primary) hypertension: Secondary | ICD-10-CM | POA: Diagnosis not present

## 2017-04-29 DIAGNOSIS — M25569 Pain in unspecified knee: Secondary | ICD-10-CM | POA: Diagnosis not present

## 2017-04-29 DIAGNOSIS — R899 Unspecified abnormal finding in specimens from other organs, systems and tissues: Secondary | ICD-10-CM | POA: Diagnosis not present

## 2017-04-29 DIAGNOSIS — R718 Other abnormality of red blood cells: Secondary | ICD-10-CM | POA: Diagnosis not present

## 2017-04-29 DIAGNOSIS — G4733 Obstructive sleep apnea (adult) (pediatric): Secondary | ICD-10-CM | POA: Diagnosis not present

## 2017-04-29 DIAGNOSIS — R0602 Shortness of breath: Secondary | ICD-10-CM | POA: Diagnosis not present

## 2017-05-06 DIAGNOSIS — M170 Bilateral primary osteoarthritis of knee: Secondary | ICD-10-CM | POA: Diagnosis not present

## 2017-05-13 ENCOUNTER — Other Ambulatory Visit: Payer: Self-pay | Admitting: *Deleted

## 2017-05-13 MED ORDER — RANITIDINE HCL 150 MG PO TABS: 150.0000 mg | ORAL_TABLET | Freq: Two times a day (BID) | ORAL | 0 refills | Status: DC

## 2017-05-15 DIAGNOSIS — E7800 Pure hypercholesterolemia, unspecified: Secondary | ICD-10-CM | POA: Diagnosis not present

## 2017-05-15 DIAGNOSIS — Z0000 Encounter for general adult medical examination without abnormal findings: Secondary | ICD-10-CM | POA: Diagnosis not present

## 2017-05-15 DIAGNOSIS — Z13820 Encounter for screening for osteoporosis: Secondary | ICD-10-CM | POA: Diagnosis not present

## 2017-05-15 DIAGNOSIS — I517 Cardiomegaly: Secondary | ICD-10-CM | POA: Diagnosis not present

## 2017-05-15 DIAGNOSIS — E559 Vitamin D deficiency, unspecified: Secondary | ICD-10-CM | POA: Diagnosis not present

## 2017-05-15 DIAGNOSIS — I10 Essential (primary) hypertension: Secondary | ICD-10-CM | POA: Diagnosis not present

## 2017-05-15 DIAGNOSIS — R8290 Unspecified abnormal findings in urine: Secondary | ICD-10-CM | POA: Diagnosis not present

## 2017-05-15 DIAGNOSIS — E039 Hypothyroidism, unspecified: Secondary | ICD-10-CM | POA: Diagnosis not present

## 2017-05-15 DIAGNOSIS — K219 Gastro-esophageal reflux disease without esophagitis: Secondary | ICD-10-CM | POA: Diagnosis not present

## 2017-05-15 DIAGNOSIS — G4730 Sleep apnea, unspecified: Secondary | ICD-10-CM | POA: Diagnosis not present

## 2017-05-15 DIAGNOSIS — R7301 Impaired fasting glucose: Secondary | ICD-10-CM | POA: Diagnosis not present

## 2017-05-21 DIAGNOSIS — Z1231 Encounter for screening mammogram for malignant neoplasm of breast: Secondary | ICD-10-CM | POA: Diagnosis not present

## 2017-05-21 DIAGNOSIS — Z780 Asymptomatic menopausal state: Secondary | ICD-10-CM | POA: Diagnosis not present

## 2017-05-22 ENCOUNTER — Encounter: Payer: Self-pay | Admitting: Internal Medicine

## 2017-05-27 DIAGNOSIS — M5136 Other intervertebral disc degeneration, lumbar region: Secondary | ICD-10-CM | POA: Diagnosis not present

## 2017-06-05 DIAGNOSIS — M546 Pain in thoracic spine: Secondary | ICD-10-CM | POA: Diagnosis not present

## 2017-06-05 DIAGNOSIS — M9902 Segmental and somatic dysfunction of thoracic region: Secondary | ICD-10-CM | POA: Diagnosis not present

## 2017-06-05 DIAGNOSIS — M9905 Segmental and somatic dysfunction of pelvic region: Secondary | ICD-10-CM | POA: Diagnosis not present

## 2017-06-05 DIAGNOSIS — M545 Low back pain: Secondary | ICD-10-CM | POA: Diagnosis not present

## 2017-06-05 DIAGNOSIS — M9903 Segmental and somatic dysfunction of lumbar region: Secondary | ICD-10-CM | POA: Diagnosis not present

## 2017-06-05 DIAGNOSIS — M25561 Pain in right knee: Secondary | ICD-10-CM | POA: Diagnosis not present

## 2017-06-05 DIAGNOSIS — M9904 Segmental and somatic dysfunction of sacral region: Secondary | ICD-10-CM | POA: Diagnosis not present

## 2017-06-11 DIAGNOSIS — G4733 Obstructive sleep apnea (adult) (pediatric): Secondary | ICD-10-CM | POA: Diagnosis not present

## 2017-06-12 DIAGNOSIS — G4733 Obstructive sleep apnea (adult) (pediatric): Secondary | ICD-10-CM | POA: Diagnosis not present

## 2017-06-17 DIAGNOSIS — M546 Pain in thoracic spine: Secondary | ICD-10-CM | POA: Diagnosis not present

## 2017-06-17 DIAGNOSIS — M25561 Pain in right knee: Secondary | ICD-10-CM | POA: Diagnosis not present

## 2017-06-17 DIAGNOSIS — M9903 Segmental and somatic dysfunction of lumbar region: Secondary | ICD-10-CM | POA: Diagnosis not present

## 2017-06-17 DIAGNOSIS — M9905 Segmental and somatic dysfunction of pelvic region: Secondary | ICD-10-CM | POA: Diagnosis not present

## 2017-06-17 DIAGNOSIS — M9904 Segmental and somatic dysfunction of sacral region: Secondary | ICD-10-CM | POA: Diagnosis not present

## 2017-06-17 DIAGNOSIS — M545 Low back pain: Secondary | ICD-10-CM | POA: Diagnosis not present

## 2017-06-17 DIAGNOSIS — M9902 Segmental and somatic dysfunction of thoracic region: Secondary | ICD-10-CM | POA: Diagnosis not present

## 2017-06-19 DIAGNOSIS — M9905 Segmental and somatic dysfunction of pelvic region: Secondary | ICD-10-CM | POA: Diagnosis not present

## 2017-06-19 DIAGNOSIS — M545 Low back pain: Secondary | ICD-10-CM | POA: Diagnosis not present

## 2017-06-19 DIAGNOSIS — M9902 Segmental and somatic dysfunction of thoracic region: Secondary | ICD-10-CM | POA: Diagnosis not present

## 2017-06-19 DIAGNOSIS — M9904 Segmental and somatic dysfunction of sacral region: Secondary | ICD-10-CM | POA: Diagnosis not present

## 2017-06-19 DIAGNOSIS — M546 Pain in thoracic spine: Secondary | ICD-10-CM | POA: Diagnosis not present

## 2017-06-19 DIAGNOSIS — M25561 Pain in right knee: Secondary | ICD-10-CM | POA: Diagnosis not present

## 2017-06-19 DIAGNOSIS — M9903 Segmental and somatic dysfunction of lumbar region: Secondary | ICD-10-CM | POA: Diagnosis not present

## 2017-06-22 ENCOUNTER — Encounter (HOSPITAL_COMMUNITY): Payer: Self-pay | Admitting: *Deleted

## 2017-06-22 ENCOUNTER — Emergency Department (HOSPITAL_COMMUNITY)
Admission: EM | Admit: 2017-06-22 | Discharge: 2017-06-22 | Disposition: A | Discharge status: Home / Self Care | Payer: PPO | Attending: Emergency Medicine | Admitting: Emergency Medicine

## 2017-06-22 DIAGNOSIS — N3001 Acute cystitis with hematuria: Secondary | ICD-10-CM | POA: Diagnosis not present

## 2017-06-22 DIAGNOSIS — Z79899 Other long term (current) drug therapy: Secondary | ICD-10-CM | POA: Insufficient documentation

## 2017-06-22 DIAGNOSIS — R197 Diarrhea, unspecified: Secondary | ICD-10-CM | POA: Insufficient documentation

## 2017-06-22 DIAGNOSIS — I10 Essential (primary) hypertension: Secondary | ICD-10-CM | POA: Insufficient documentation

## 2017-06-22 DIAGNOSIS — R509 Fever, unspecified: Secondary | ICD-10-CM | POA: Diagnosis not present

## 2017-06-22 DIAGNOSIS — R112 Nausea with vomiting, unspecified: Secondary | ICD-10-CM | POA: Diagnosis not present

## 2017-06-22 LAB — CBC
HCT: 36.6 % (ref 36.0–46.0)
Hemoglobin: 12.1 g/dL (ref 12.0–15.0)
MCH: 29.3 pg (ref 26.0–34.0)
MCHC: 33.1 g/dL (ref 30.0–36.0)
MCV: 88.6 fL (ref 78.0–100.0)
Platelets: 182 10*3/uL (ref 150–400)
RBC: 4.13 MIL/uL (ref 3.87–5.11)
RDW: 13.1 % (ref 11.5–15.5)
WBC: 23.3 10*3/uL — ABNORMAL HIGH (ref 4.0–10.5)

## 2017-06-22 LAB — COMPREHENSIVE METABOLIC PANEL
ALT: 20 U/L (ref 14–54)
AST: 21 U/L (ref 15–41)
Albumin: 3.8 g/dL (ref 3.5–5.0)
Alkaline Phosphatase: 43 U/L (ref 38–126)
Anion gap: 9 (ref 5–15)
BUN: 11 mg/dL (ref 6–20)
CO2: 25 mmol/L (ref 22–32)
Calcium: 8.8 mg/dL — ABNORMAL LOW (ref 8.9–10.3)
Chloride: 100 mmol/L — ABNORMAL LOW (ref 101–111)
Creatinine, Ser: 0.9 mg/dL (ref 0.44–1.00)
GFR calc Af Amer: >60 mL/min (ref >60)
GFR calc non Af Amer: >60 mL/min (ref >60)
Glucose, Bld: 151 mg/dL — ABNORMAL HIGH (ref 65–99)
Potassium: 4 mmol/L (ref 3.5–5.1)
Sodium: 134 mmol/L — ABNORMAL LOW (ref 135–145)
Total Bilirubin: 1.5 mg/dL — ABNORMAL HIGH (ref 0.3–1.2)
Total Protein: 7.3 g/dL (ref 6.5–8.1)

## 2017-06-22 LAB — URINALYSIS, ROUTINE W REFLEX MICROSCOPIC
Bilirubin Urine: NEGATIVE
Glucose, UA: NEGATIVE mg/dL
Hgb urine dipstick: NEGATIVE
Ketones, ur: NEGATIVE mg/dL
Nitrite: POSITIVE — AB
Protein, ur: 30 mg/dL — AB
Specific Gravity, Urine: 1.017 (ref 1.005–1.030)
pH: 9 — ABNORMAL HIGH (ref 5.0–8.0)

## 2017-06-22 LAB — MAGNESIUM: Magnesium: 1.8 mg/dL (ref 1.7–2.4)

## 2017-06-22 LAB — LIPASE, BLOOD: Lipase: 23 U/L (ref 11–51)

## 2017-06-22 MED ORDER — CIPROFLOXACIN HCL 500 MG PO TABS: 500.0000 mg | ORAL_TABLET | Freq: Two times a day (BID) | ORAL | 0 refills | Status: AC

## 2017-06-22 MED ORDER — ONDANSETRON 4 MG PO TBDP: 4.0000 mg | ORAL_TABLET | Freq: Once | ORAL | Status: DC

## 2017-06-22 MED ORDER — ONDANSETRON HCL 4 MG/2ML IJ SOLN
4.0000 mg | Freq: Once | INTRAMUSCULAR | Status: AC
  Administered 2017-06-22: 4 mg via INTRAVENOUS
  Filled 2017-06-22: qty 2

## 2017-06-22 MED ORDER — SODIUM CHLORIDE 0.9 % IV BOLUS (SEPSIS)
1000.0000 mL | Freq: Once | INTRAVENOUS | Status: AC
  Administered 2017-06-22: 1000 mL via INTRAVENOUS

## 2017-06-22 MED ORDER — ACETAMINOPHEN 500 MG PO TABS
1000.0000 mg | ORAL_TABLET | Freq: Once | ORAL | Status: AC
  Administered 2017-06-22: 1000 mg via ORAL
  Filled 2017-06-22: qty 2

## 2017-06-22 MED ORDER — ONDANSETRON 4 MG PO TBDP: 4.0000 mg | ORAL_TABLET | Freq: Three times a day (TID) | ORAL | 0 refills | Status: DC | PRN

## 2017-06-22 NOTE — ED Provider Notes (Signed)
[place] DEPT Provider Note   CSN: [id number] Arrival date & time: 06/22/17  1046     History   Chief Complaint Chief Complaint  Patient presents with  . Emesis  . Diarrhea    HPI Sherry Bell is a 71 y.o. female.  71 yo F with a cc of n/v/d.  Started this morning about 2am.  Had some old vegetables yesterday.  Having fevers. Having some mild epigastric pain.     The history is provided by the patient.  Emesis   Associated symptoms include abdominal pain (mild epigastric) and diarrhea. Pertinent negatives include no arthralgias, no chills, no fever, no headaches and no myalgias.  Diarrhea   Associated symptoms include abdominal pain (mild epigastric) and vomiting. Pertinent negatives include no chills, no headaches, no arthralgias and no myalgias.  Illness  This is a new problem. The current episode started 6 to 12 hours ago. The problem occurs constantly. The problem has not changed since onset.Associated symptoms include abdominal pain (mild epigastric). Pertinent negatives include no chest pain, no headaches and no shortness of breath. Nothing aggravates the symptoms. Nothing relieves the symptoms. She has tried nothing for the symptoms. The treatment provided no relief.    Past Medical History:  Diagnosis Date  . Allergy   . Arthritis   . Cataract   . Cystocele   . Fibroids   . Gastric reflux   . Generalized headaches   . H. pylori infection    8 years ago  . Heart murmur    Mild mitral regurgitation- pt denies this 07-02-16 at [facility]  . Hiatal hernia   . Hypertension   . Obstructive sleep apnea    uses CPAP  . Pain in limb   . Sleep apnea    uses CPAP  . Thyroid disease   . Varicose veins     Patient Active Problem List   Diagnosis Date Noted  . Thyroid cyst 04/30/2016  . Adult onset hypothyroidism 03/07/2015  . Hypertension   . Fibroids     Past Surgical History:  Procedure Laterality Date  . BREATH TEK H PYLORI N/A 05/14/2016   Procedure:  BREATH TEK H PYLORI;  Surgeon: Jerene Bears, MD;  Location: [facility];  Service: Gastroenterology;  Laterality: N/A;  . COLONOSCOPY     Dr Lajoyce Corners  . ESOPHAGOGASTRODUODENOSCOPY     Dr Lajoyce Corners  . TONSILECTOMY, ADENOIDECTOMY, BILATERAL MYRINGOTOMY AND TUBES      OB History    No data available       Home Medications    Prior to Admission medications   Medication Sig Start Date End Date Taking? Authorizing Provider  cholecalciferol (VITAMIN D) 1000 UNITS tablet Take 5,000 Units by mouth daily.   Yes [provider]  liothyronine (CYTOMEL) 25 MCG tablet Take 12.5 mcg by mouth 2 (two) times daily.  04/08/14  Yes [provider]  losartan (COZAAR) 50 MG tablet Take 50 mg by mouth daily.   Yes [provider]  Magnesium 300 MG CAPS Take 1 capsule by mouth daily. Reported on 02/14/2016   Yes [provider]  ranitidine (ZANTAC) 150 MG tablet Take 1 tablet (150 mg total) by mouth 2 (two) times daily. 05/13/17  Yes Pyrtle, Lajuan Lines, MD  SYNTHROID 100 MCG tablet TAKE 1 TABLET BY MOUTH EVERY DAY 02/18/17  Yes Elayne Snare, MD  ciprofloxacin (CIPRO) 500 MG tablet Take 1 tablet (500 mg total) by mouth 2 (two) times daily. 06/22/17 07/02/17  Tyrone Nine,  Bunnie Lederman, DO  ondansetron (ZOFRAN ODT) 4 MG disintegrating tablet Take 1 tablet (4 mg total) by mouth every 8 (eight) hours as needed for nausea or vomiting. 06/22/17   Deno Etienne, DO    Family History Family History  Problem Relation Age of Onset  . Throat cancer Father        worked at [employer]  . Hypothyroidism Sister   . Colon cancer Neg Hx   . Esophageal cancer Neg Hx   . Rectal cancer Neg Hx   . Stomach cancer Neg Hx     Social History Social History  Substance Use Topics  . Smoking status: Never Smoker  . Smokeless tobacco: Never Used  . Alcohol use No     Allergies   Pneumococcal 13-val conj vacc; Codeine; and Diphth-acell pertussis-tetanus   Review of Systems Review of Systems  Constitutional: Negative for  chills and fever.  HENT: Negative for congestion and rhinorrhea.   Eyes: Negative for redness and visual disturbance.  Respiratory: Negative for shortness of breath and wheezing.   Cardiovascular: Negative for chest pain and palpitations.  Gastrointestinal: Positive for abdominal pain (mild epigastric), diarrhea and vomiting. Negative for nausea.  Genitourinary: Negative for dysuria and urgency.  Musculoskeletal: Negative for arthralgias and myalgias.  Skin: Negative for pallor and wound.  Neurological: Negative for dizziness and headaches.     Physical Exam Updated Vital Signs BP (!) 116/56   Pulse (!) 104   Temp 99.4 F (37.4 C) (Oral)   Resp (!) 24   SpO2 91%   Physical Exam  Constitutional: She is oriented to person, place, and time. She appears well-developed and well-nourished. No distress.  Pale  HENT:  Head: Normocephalic and atraumatic.  Eyes: Pupils are equal, round, and reactive to light. EOM are normal.  Neck: Normal range of motion. Neck supple.  Cardiovascular: Normal rate and regular rhythm.  Exam reveals no gallop and no friction rub.   No murmur heard. Pulmonary/Chest: Effort normal. She has no wheezes. She has no rales.  Abdominal: Soft. She exhibits no distension. There is no tenderness.  Musculoskeletal: She exhibits no edema or tenderness.  Neurological: She is alert and oriented to person, place, and time.  Skin: Skin is warm. She is diaphoretic.  Psychiatric: She has a normal mood and affect. Her behavior is normal.  Nursing note and vitals reviewed.    ED Treatments / Results  Labs (all labs ordered are listed, but only abnormal results are displayed) Labs Reviewed  COMPREHENSIVE METABOLIC PANEL - Abnormal; Notable for the following:       Result Value   Sodium 134 (*)    Chloride 100 (*)    Glucose, Bld 151 (*)    Calcium 8.8 (*)    Total Bilirubin 1.5 (*)    All other components within normal limits  CBC - Abnormal; Notable for the  following:    WBC 23.3 (*)    All other components within normal limits  URINALYSIS, ROUTINE W REFLEX MICROSCOPIC - Abnormal; Notable for the following:    APPearance HAZY (*)    pH 9.0 (*)    Protein, ur 30 (*)    Nitrite POSITIVE (*)    Leukocytes, UA SMALL (*)    Bacteria, UA RARE (*)    Squamous Epithelial / LPF 0-5 (*)    All other components within normal limits  LIPASE, BLOOD  MAGNESIUM    EKG  EKG Interpretation  Date/Time:  Saturday June 22 2017 11:01:55 EDT  Ventricular Rate:  97 PR Interval:    QRS Duration: 87 QT Interval:  355 QTC Calculation: 451 R Axis:   45 Text Interpretation:  Sinus rhythm Low voltage, precordial leads No significant change since last tracing Confirmed by Deno Etienne [phone]) on 06/22/2017 11:09:10 AM       Radiology No results found.  Procedures Procedures (including critical care time) Procedure note: Ultrasound Guided Peripheral IV Ultrasound guided peripheral 1.88 inch angiocath IV placement performed by me. Indications: Nursing unable to place IV. Details: The antecubital fossa and upper arm were evaluated with a multifrequency linear probe. Patent brachial veins were noted. 1 attempt was made to cannulate a vein under realtime US guidance with successful cannulation of the vein and catheter placement. There is return of non-pulsatile dark red blood. The patient tolerated the procedure well without complications. Images archived electronically.  CPT codes: [phone] and [phone]  Medications Ordered in ED Medications  ondansetron (ZOFRAN-ODT) disintegrating tablet 4 mg (4 mg Oral Not Given 06/22/17 1505)  sodium chloride 0.9 % bolus 1,000 mL (0 mLs Intravenous Stopped 06/22/17 1416)  ondansetron (ZOFRAN) injection 4 mg (4 mg Intravenous Given 06/22/17 1237)  acetaminophen (TYLENOL) tablet 1,000 mg (1,000 mg Oral Given 06/22/17 1247)     Initial Impression / Assessment and Plan / ED Course  I have reviewed the triage vital signs and the  nursing notes.  Pertinent labs & imaging results that were available during my care of the patient were reviewed by me and considered in my medical decision making (see chart for details).     71 yo F with a cc of abdominal pain.  Started acutely this morning with n/v/d.    The patient with some mild right CVA tenderness. UA with positive nitrites and small leukocytes. Will treat for pyelonephritis. She also has been having nausea vomiting and diarrhea will give Cipro. I discussed with the patient risk and benefits of admission and offered to admit her to the hospital. She is currently electing for discharge. PCP follow-up.   3:27 PM:  I have discussed the diagnosis/risks/treatment options with the patient and family and believe the pt to be eligible for discharge home to follow-up with PCP. We also discussed returning to the ED immediately if new or worsening sx occur. We discussed the sx which are most concerning (e.g., sudden worsening pain, fever, inability to tolerate by mouth) that necessitate immediate return. Medications administered to the patient during their visit and any new prescriptions provided to the patient are listed below.  Medications given during this visit Medications  ondansetron (ZOFRAN-ODT) disintegrating tablet 4 mg (4 mg Oral Not Given 06/22/17 1505)  sodium chloride 0.9 % bolus 1,000 mL (0 mLs Intravenous Stopped 06/22/17 1416)  ondansetron (ZOFRAN) injection 4 mg (4 mg Intravenous Given 06/22/17 1237)  acetaminophen (TYLENOL) tablet 1,000 mg (1,000 mg Oral Given 06/22/17 1247)     The patient appears reasonably screen and/or stabilized for discharge and I doubt any other medical condition or other [facility] requiring further screening, evaluation, or treatment in the ED at this time prior to discharge.   Final Clinical Impressions(s) / ED Diagnoses   Final diagnoses:  Acute cystitis with hematuria  Nausea vomiting and diarrhea    New Prescriptions Discharge  Medication List as of 06/22/2017  3:03 PM    START taking these medications   Details  ciprofloxacin (CIPRO) 500 MG tablet Take 1 tablet (500 mg total) by mouth 2 (two) times daily., Starting Sat 06/22/2017, Until Tue 07/02/2017,  Print    ondansetron (ZOFRAN ODT) 4 MG disintegrating tablet Take 1 tablet (4 mg total) by mouth every 8 (eight) hours as needed for nausea or vomiting., Starting Sat 06/22/2017, Print         [place], Linna Hoff, DO 06/22/17 1527

## 2017-06-22 NOTE — ED Notes (Signed)
Got patient undress on the monitor did ekg shown to Dr Tyrone Nine patient is resting

## 2017-06-22 NOTE — ED Triage Notes (Signed)
Pt arrived by [facility] from home. Pt had onset this am of chills, headache and n/v/d.

## 2017-06-22 NOTE — Discharge Instructions (Signed)
Take imodium for diarrhea.  Return for pinpoint abdominal pain, inability to eat or drink.

## 2017-06-22 NOTE — ED Notes (Signed)
ED Provider at bedside. 

## 2017-07-01 DIAGNOSIS — M546 Pain in thoracic spine: Secondary | ICD-10-CM | POA: Diagnosis not present

## 2017-07-01 DIAGNOSIS — M9904 Segmental and somatic dysfunction of sacral region: Secondary | ICD-10-CM | POA: Diagnosis not present

## 2017-07-01 DIAGNOSIS — M9903 Segmental and somatic dysfunction of lumbar region: Secondary | ICD-10-CM | POA: Diagnosis not present

## 2017-07-01 DIAGNOSIS — M545 Low back pain: Secondary | ICD-10-CM | POA: Diagnosis not present

## 2017-07-01 DIAGNOSIS — M25561 Pain in right knee: Secondary | ICD-10-CM | POA: Diagnosis not present

## 2017-07-01 DIAGNOSIS — M9905 Segmental and somatic dysfunction of pelvic region: Secondary | ICD-10-CM | POA: Diagnosis not present

## 2017-07-01 DIAGNOSIS — M9902 Segmental and somatic dysfunction of thoracic region: Secondary | ICD-10-CM | POA: Diagnosis not present

## 2017-07-03 DIAGNOSIS — M9905 Segmental and somatic dysfunction of pelvic region: Secondary | ICD-10-CM | POA: Diagnosis not present

## 2017-07-03 DIAGNOSIS — M546 Pain in thoracic spine: Secondary | ICD-10-CM | POA: Diagnosis not present

## 2017-07-03 DIAGNOSIS — M545 Low back pain: Secondary | ICD-10-CM | POA: Diagnosis not present

## 2017-07-03 DIAGNOSIS — M25561 Pain in right knee: Secondary | ICD-10-CM | POA: Diagnosis not present

## 2017-07-03 DIAGNOSIS — M9903 Segmental and somatic dysfunction of lumbar region: Secondary | ICD-10-CM | POA: Diagnosis not present

## 2017-07-03 DIAGNOSIS — M9904 Segmental and somatic dysfunction of sacral region: Secondary | ICD-10-CM | POA: Diagnosis not present

## 2017-07-03 DIAGNOSIS — M9902 Segmental and somatic dysfunction of thoracic region: Secondary | ICD-10-CM | POA: Diagnosis not present

## 2017-07-08 DIAGNOSIS — M9903 Segmental and somatic dysfunction of lumbar region: Secondary | ICD-10-CM | POA: Diagnosis not present

## 2017-07-08 DIAGNOSIS — M546 Pain in thoracic spine: Secondary | ICD-10-CM | POA: Diagnosis not present

## 2017-07-08 DIAGNOSIS — M25561 Pain in right knee: Secondary | ICD-10-CM | POA: Diagnosis not present

## 2017-07-08 DIAGNOSIS — M9902 Segmental and somatic dysfunction of thoracic region: Secondary | ICD-10-CM | POA: Diagnosis not present

## 2017-07-08 DIAGNOSIS — M9905 Segmental and somatic dysfunction of pelvic region: Secondary | ICD-10-CM | POA: Diagnosis not present

## 2017-07-08 DIAGNOSIS — M545 Low back pain: Secondary | ICD-10-CM | POA: Diagnosis not present

## 2017-07-08 DIAGNOSIS — M9904 Segmental and somatic dysfunction of sacral region: Secondary | ICD-10-CM | POA: Diagnosis not present

## 2017-07-10 DIAGNOSIS — M545 Low back pain: Secondary | ICD-10-CM | POA: Diagnosis not present

## 2017-07-10 DIAGNOSIS — M9905 Segmental and somatic dysfunction of pelvic region: Secondary | ICD-10-CM | POA: Diagnosis not present

## 2017-07-10 DIAGNOSIS — M546 Pain in thoracic spine: Secondary | ICD-10-CM | POA: Diagnosis not present

## 2017-07-10 DIAGNOSIS — M9904 Segmental and somatic dysfunction of sacral region: Secondary | ICD-10-CM | POA: Diagnosis not present

## 2017-07-10 DIAGNOSIS — M9902 Segmental and somatic dysfunction of thoracic region: Secondary | ICD-10-CM | POA: Diagnosis not present

## 2017-07-10 DIAGNOSIS — M9903 Segmental and somatic dysfunction of lumbar region: Secondary | ICD-10-CM | POA: Diagnosis not present

## 2017-07-10 DIAGNOSIS — M25561 Pain in right knee: Secondary | ICD-10-CM | POA: Diagnosis not present

## 2017-07-15 DIAGNOSIS — M546 Pain in thoracic spine: Secondary | ICD-10-CM | POA: Diagnosis not present

## 2017-07-15 DIAGNOSIS — M545 Low back pain: Secondary | ICD-10-CM | POA: Diagnosis not present

## 2017-07-15 DIAGNOSIS — M9902 Segmental and somatic dysfunction of thoracic region: Secondary | ICD-10-CM | POA: Diagnosis not present

## 2017-07-15 DIAGNOSIS — M9904 Segmental and somatic dysfunction of sacral region: Secondary | ICD-10-CM | POA: Diagnosis not present

## 2017-07-15 DIAGNOSIS — M9903 Segmental and somatic dysfunction of lumbar region: Secondary | ICD-10-CM | POA: Diagnosis not present

## 2017-07-15 DIAGNOSIS — M25561 Pain in right knee: Secondary | ICD-10-CM | POA: Diagnosis not present

## 2017-07-15 DIAGNOSIS — M9905 Segmental and somatic dysfunction of pelvic region: Secondary | ICD-10-CM | POA: Diagnosis not present

## 2017-07-17 ENCOUNTER — Encounter: Payer: Self-pay | Admitting: Internal Medicine

## 2017-07-17 DIAGNOSIS — M9905 Segmental and somatic dysfunction of pelvic region: Secondary | ICD-10-CM | POA: Diagnosis not present

## 2017-07-17 DIAGNOSIS — M9903 Segmental and somatic dysfunction of lumbar region: Secondary | ICD-10-CM | POA: Diagnosis not present

## 2017-07-17 DIAGNOSIS — M546 Pain in thoracic spine: Secondary | ICD-10-CM | POA: Diagnosis not present

## 2017-07-17 DIAGNOSIS — M9902 Segmental and somatic dysfunction of thoracic region: Secondary | ICD-10-CM | POA: Diagnosis not present

## 2017-07-17 DIAGNOSIS — M9904 Segmental and somatic dysfunction of sacral region: Secondary | ICD-10-CM | POA: Diagnosis not present

## 2017-07-17 DIAGNOSIS — M25561 Pain in right knee: Secondary | ICD-10-CM | POA: Diagnosis not present

## 2017-07-17 DIAGNOSIS — M545 Low back pain: Secondary | ICD-10-CM | POA: Diagnosis not present

## 2017-07-22 DIAGNOSIS — M9902 Segmental and somatic dysfunction of thoracic region: Secondary | ICD-10-CM | POA: Diagnosis not present

## 2017-07-22 DIAGNOSIS — M25561 Pain in right knee: Secondary | ICD-10-CM | POA: Diagnosis not present

## 2017-07-22 DIAGNOSIS — M545 Low back pain: Secondary | ICD-10-CM | POA: Diagnosis not present

## 2017-07-22 DIAGNOSIS — M546 Pain in thoracic spine: Secondary | ICD-10-CM | POA: Diagnosis not present

## 2017-07-22 DIAGNOSIS — M9903 Segmental and somatic dysfunction of lumbar region: Secondary | ICD-10-CM | POA: Diagnosis not present

## 2017-07-22 DIAGNOSIS — M9904 Segmental and somatic dysfunction of sacral region: Secondary | ICD-10-CM | POA: Diagnosis not present

## 2017-07-22 DIAGNOSIS — M9905 Segmental and somatic dysfunction of pelvic region: Secondary | ICD-10-CM | POA: Diagnosis not present

## 2017-08-01 DIAGNOSIS — H524 Presbyopia: Secondary | ICD-10-CM | POA: Diagnosis not present

## 2017-08-01 DIAGNOSIS — H10413 Chronic giant papillary conjunctivitis, bilateral: Secondary | ICD-10-CM | POA: Diagnosis not present

## 2017-08-01 DIAGNOSIS — H353121 Nonexudative age-related macular degeneration, left eye, early dry stage: Secondary | ICD-10-CM | POA: Diagnosis not present

## 2017-08-01 DIAGNOSIS — H25813 Combined forms of age-related cataract, bilateral: Secondary | ICD-10-CM | POA: Diagnosis not present

## 2017-08-04 ENCOUNTER — Other Ambulatory Visit: Payer: Self-pay | Admitting: Endocrinology

## 2017-08-06 DIAGNOSIS — R0602 Shortness of breath: Secondary | ICD-10-CM | POA: Diagnosis not present

## 2017-08-06 DIAGNOSIS — G4733 Obstructive sleep apnea (adult) (pediatric): Secondary | ICD-10-CM | POA: Diagnosis not present

## 2017-08-06 DIAGNOSIS — I10 Essential (primary) hypertension: Secondary | ICD-10-CM | POA: Diagnosis not present

## 2017-08-12 ENCOUNTER — Other Ambulatory Visit: Payer: PPO

## 2017-08-19 ENCOUNTER — Ambulatory Visit: Payer: PPO | Admitting: Endocrinology

## 2017-08-19 DIAGNOSIS — Z008 Encounter for other general examination: Secondary | ICD-10-CM | POA: Diagnosis not present

## 2017-08-20 ENCOUNTER — Other Ambulatory Visit: Payer: PPO

## 2017-08-20 DIAGNOSIS — Z6841 Body Mass Index (BMI) 40.0 and over, adult: Secondary | ICD-10-CM | POA: Diagnosis not present

## 2017-08-20 DIAGNOSIS — E669 Obesity, unspecified: Secondary | ICD-10-CM | POA: Diagnosis not present

## 2017-08-20 DIAGNOSIS — N8110 Cystocele, unspecified: Secondary | ICD-10-CM | POA: Diagnosis not present

## 2017-08-21 DIAGNOSIS — M1711 Unilateral primary osteoarthritis, right knee: Secondary | ICD-10-CM | POA: Diagnosis not present

## 2017-08-21 DIAGNOSIS — M1712 Unilateral primary osteoarthritis, left knee: Secondary | ICD-10-CM | POA: Diagnosis not present

## 2017-09-03 DIAGNOSIS — M79604 Pain in right leg: Secondary | ICD-10-CM | POA: Diagnosis not present

## 2017-09-17 ENCOUNTER — Other Ambulatory Visit (INDEPENDENT_AMBULATORY_CARE_PROVIDER_SITE_OTHER): Payer: PPO

## 2017-09-17 DIAGNOSIS — E063 Autoimmune thyroiditis: Secondary | ICD-10-CM | POA: Diagnosis not present

## 2017-09-17 DIAGNOSIS — G4733 Obstructive sleep apnea (adult) (pediatric): Secondary | ICD-10-CM | POA: Diagnosis not present

## 2017-09-17 LAB — TSH: TSH: 3.51 u[IU]/mL (ref 0.35–4.50)

## 2017-09-17 LAB — T4, FREE: Free T4: 0.78 ng/dL (ref 0.60–1.60)

## 2017-09-17 LAB — T3, FREE: T3, Free: 3 pg/mL (ref 2.3–4.2)

## 2017-09-23 ENCOUNTER — Encounter: Payer: PPO | Admitting: Internal Medicine

## 2017-09-30 ENCOUNTER — Encounter: Payer: Self-pay | Admitting: Endocrinology

## 2017-09-30 ENCOUNTER — Ambulatory Visit: Payer: PPO | Admitting: Endocrinology

## 2017-09-30 VITALS — BP 122/76 | HR 78 | Ht 62.6 in | Wt 229.4 lb

## 2017-09-30 DIAGNOSIS — E038 Other specified hypothyroidism: Secondary | ICD-10-CM

## 2017-09-30 NOTE — Progress Notes (Signed)
Patient ID: Sherry Bell, female   DOB: 07-04-1946, 71 y.o.   MRN: [id number]           Reason for Appointment: Follow-up of hypothyroidism and of thyroid cyst    History of Present Illness:   THYROID cyst:  The patient's thyroid nodule was first discovered in early 2016 soon after she had a respiratory infection in 11/2014 She had felt a swelling on the left side of her neck and was seen by her PCP for evaluation in 3/16 She did not have any swelling prior to that.  She does not feel that her neck swelling has increased in size and it may have moved more towards the center of the neck  The thyroid ultrasound showed the following Dominant simple appearing cyst occupies much of the left lobe and measures approximately 5.2 x 5.4 x 4.6 cm.  This cyst does not have any complex features or mural nodularity. On review of her previous records she had a predominantly cystic lesion in the upper pole of her thyroid measuring 1.7 cm in 2003 This was biopsied in 2002 and was benign Also she had a subcentimeter cyst in the lower left pole of the left side in 2003  RECENT history: Subsequently she had a increase in size of her left thyroid nodule with pressure sensation Aspiration of this cyst was successfully done in June 2017 by Dr. Loanne Drilling and about 100 mL of fluid removed which was negative for cytology  She does not feel any swelling in her neck since then   HYPOTHYROIDISM:  This was diagnosed probably in the 1990s when the patient had gone to her physician because of weight gain. She does not think she had any unusual fatigue at that time She was found to be hypothyroid and has been on thyroid supplements since then.  She does not think she felt any different with taking the medication but her weight stabilized.  Her previous endocrinologist put her on combination of Synthroid and Cytomel 25 g, 1/2 tablet, this was prescribed twice a day   For some time she has However with only  taking her Cytomel in the morning as she would be forgetting her evening dose she did not since she forgets in the evening She complains of feeling tired and sleepy though she is using CPAP for sleep apnea  Currently on Synthroid 100 g and Cytomel 12.5 g in the morning She now says that she feels sleepy for an hour after taking her thyroid medications which she is taking daily  TSH is again normal although trending higher Free T3 is slightly lower than before as also free T4   Lab Results  Component Value Date   FREET4 0.78 09/17/2017   FREET4 0.83 02/12/2017   FREET4 0.68 08/14/2016   TSH 3.51 09/17/2017   TSH 2.47 02/12/2017   TSH 1.99 08/14/2016   Lab Results  Component Value Date   T3FREE 3.0 09/17/2017   T3FREE 3.4 02/12/2017   T3FREE 2.6 08/14/2016    Allergies as of 09/30/2017      Reactions   Pneumococcal 13-val Conj Vacc Other (See Comments)   Codeine Nausea And Vomiting, Rash, Nausea Only   Diphth-acell Pertussis-tetanus Palpitations      Medication List        Accurate as of 09/30/17  9:45 AM. Always use your most recent med list.          cholecalciferol 1000 units tablet Commonly known as:  VITAMIN D Take 5,000  Units by mouth daily.   liothyronine 25 MCG tablet Commonly known as:  CYTOMEL Take 12.5 mcg by mouth 2 (two) times daily.   losartan 50 MG tablet Commonly known as:  COZAAR Take 50 mg by mouth daily.   Magnesium 300 MG Caps Take 1 capsule by mouth daily. Reported on 02/14/2016   ranitidine 150 MG tablet Commonly known as:  ZANTAC Take 1 tablet (150 mg total) by mouth 2 (two) times daily.   SYNTHROID 100 MCG tablet Generic drug:  levothyroxine TAKE 1 TABLET BY MOUTH EVERY DAY       Allergies:  Allergies  Allergen Reactions  . Pneumococcal 13-Val Conj Vacc Other (See Comments)  . Codeine Nausea And Vomiting, Rash and Nausea Only  . Diphth-Acell Pertussis-Tetanus Palpitations    Past Medical History:  Diagnosis Date  .  Allergy   . Arthritis   . Cataract   . Cystocele   . Fibroids   . Gastric reflux   . Generalized headaches   . H. pylori infection    8 years ago  . Heart murmur    Mild mitral regurgitation- pt denies this 07-02-16 at [facility]  . Hiatal hernia   . Hypertension   . Obstructive sleep apnea    uses CPAP  . Pain in limb   . Sleep apnea    uses CPAP  . Thyroid disease   . Varicose veins     There is no history of radiation to the neck in childhood  Past Surgical History:  Procedure Laterality Date  . COLONOSCOPY     Dr Lajoyce Corners  . ESOPHAGOGASTRODUODENOSCOPY     Dr Lajoyce Corners  . TONSILECTOMY, ADENOIDECTOMY, BILATERAL MYRINGOTOMY AND TUBES      Family History  Problem Relation Age of Onset  . Throat cancer Father        worked at [employer]  . Hypothyroidism Sister   . Colon cancer Neg Hx   . Esophageal cancer Neg Hx   . Rectal cancer Neg Hx   . Stomach cancer Neg Hx     Social History:  reports that  has never smoked. she has never used smokeless tobacco. She reports that she does not drink alcohol or use drugs.   Review of Systems:      She has had sleep apnea diagnosed in 2013 using CPAP         Examination:   BP 122/76   Pulse 78   Ht 5' 2.6" (1.59 m)   Wt 229 lb 6.4 oz (104.1 kg)   SpO2 95%   BMI 41.16 kg/m        Left thyroid lobe not palpable  Right-sided barely palpable on  swallowing  Biceps reflexes are normal   Assessment/Plan:  Thyroid nodule: She has had a simple cyst on the left side which was completely resolved with aspiration in June 2017 and has not recurred She has minimal thyroid enlargement on  the right side only today  HYPOTHYROIDISM: She has had nonspecific fatigue and sleepiness Not clear why she is complaining of feeling sleepy for an hour after taking her thyroid medication and may not be related to her thyroid medication also She does have mild chronic fatigue Although recommended that we increase her levothyroxine slightly because of  slightly higher TSH and lower free T4 level she does not want to change at this time However she can try taking her medications for thyroid supplements separately with one or the other taken at lunch and  see if she feels better with this change Follow-up in 6 months  [facility] 09/30/2017

## 2017-10-28 ENCOUNTER — Encounter: Payer: PPO | Admitting: Internal Medicine

## 2017-11-11 DIAGNOSIS — M1712 Unilateral primary osteoarthritis, left knee: Secondary | ICD-10-CM | POA: Diagnosis not present

## 2017-11-11 DIAGNOSIS — M170 Bilateral primary osteoarthritis of knee: Secondary | ICD-10-CM | POA: Diagnosis not present

## 2017-11-11 DIAGNOSIS — M7731 Calcaneal spur, right foot: Secondary | ICD-10-CM | POA: Diagnosis not present

## 2017-11-11 DIAGNOSIS — M25571 Pain in right ankle and joints of right foot: Secondary | ICD-10-CM | POA: Diagnosis not present

## 2017-11-11 DIAGNOSIS — M1711 Unilateral primary osteoarthritis, right knee: Secondary | ICD-10-CM | POA: Diagnosis not present

## 2017-11-21 DIAGNOSIS — M170 Bilateral primary osteoarthritis of knee: Secondary | ICD-10-CM | POA: Diagnosis not present

## 2017-11-25 ENCOUNTER — Telehealth: Payer: Self-pay | Admitting: *Deleted

## 2017-11-25 MED ORDER — RANITIDINE HCL 150 MG PO TABS: 150.0000 mg | ORAL_TABLET | Freq: Two times a day (BID) | ORAL | 0 refills | Status: DC

## 2017-11-25 NOTE — Telephone Encounter (Signed)
Rx sent 

## 2017-11-29 DIAGNOSIS — M25562 Pain in left knee: Secondary | ICD-10-CM | POA: Diagnosis not present

## 2017-11-29 DIAGNOSIS — M1712 Unilateral primary osteoarthritis, left knee: Secondary | ICD-10-CM | POA: Diagnosis not present

## 2017-11-29 DIAGNOSIS — M25561 Pain in right knee: Secondary | ICD-10-CM | POA: Diagnosis not present

## 2017-11-29 DIAGNOSIS — M1711 Unilateral primary osteoarthritis, right knee: Secondary | ICD-10-CM | POA: Diagnosis not present

## 2017-12-09 ENCOUNTER — Ambulatory Visit (AMBULATORY_SURGERY_CENTER): Payer: Self-pay | Admitting: *Deleted

## 2017-12-09 ENCOUNTER — Other Ambulatory Visit: Payer: Self-pay

## 2017-12-09 VITALS — Ht 63.0 in | Wt 230.4 lb

## 2017-12-09 DIAGNOSIS — K317 Polyp of stomach and duodenum: Secondary | ICD-10-CM

## 2017-12-09 DIAGNOSIS — Z1211 Encounter for screening for malignant neoplasm of colon: Secondary | ICD-10-CM

## 2017-12-09 MED ORDER — PEG 3350-KCL-NA BICARB-NACL 420 G PO SOLR: 4000.0000 mL | Freq: Once | ORAL | 0 refills | Status: AC

## 2017-12-09 NOTE — Progress Notes (Signed)
No egg or soy allergy known to patient  No issues with past sedation with any surgeries  or procedures, no intubation problems  No diet pills per patient No home 02 use per patient  No blood thinners per patient  Pt denies issues with constipation  No A fib or A flutter  EMMI video sent to pt's e mail  

## 2017-12-12 ENCOUNTER — Encounter: Payer: Self-pay | Admitting: Internal Medicine

## 2017-12-17 DIAGNOSIS — I10 Essential (primary) hypertension: Secondary | ICD-10-CM | POA: Diagnosis not present

## 2017-12-17 DIAGNOSIS — R0602 Shortness of breath: Secondary | ICD-10-CM | POA: Diagnosis not present

## 2017-12-17 DIAGNOSIS — G4733 Obstructive sleep apnea (adult) (pediatric): Secondary | ICD-10-CM | POA: Diagnosis not present

## 2017-12-23 ENCOUNTER — Ambulatory Visit (AMBULATORY_SURGERY_CENTER): Payer: PPO | Admitting: Internal Medicine

## 2017-12-23 ENCOUNTER — Encounter: Payer: Self-pay | Admitting: Internal Medicine

## 2017-12-23 ENCOUNTER — Other Ambulatory Visit: Payer: Self-pay

## 2017-12-23 VITALS — BP 139/66 | HR 69 | Temp 97.5°F | Resp 16 | Ht 63.0 in | Wt 230.0 lb

## 2017-12-23 DIAGNOSIS — Z1211 Encounter for screening for malignant neoplasm of colon: Secondary | ICD-10-CM

## 2017-12-23 DIAGNOSIS — D125 Benign neoplasm of sigmoid colon: Secondary | ICD-10-CM

## 2017-12-23 DIAGNOSIS — D120 Benign neoplasm of cecum: Secondary | ICD-10-CM | POA: Diagnosis not present

## 2017-12-23 DIAGNOSIS — K3189 Other diseases of stomach and duodenum: Secondary | ICD-10-CM | POA: Diagnosis not present

## 2017-12-23 DIAGNOSIS — K209 Esophagitis, unspecified: Secondary | ICD-10-CM

## 2017-12-23 DIAGNOSIS — D124 Benign neoplasm of descending colon: Secondary | ICD-10-CM

## 2017-12-23 DIAGNOSIS — D122 Benign neoplasm of ascending colon: Secondary | ICD-10-CM

## 2017-12-23 DIAGNOSIS — K635 Polyp of colon: Secondary | ICD-10-CM

## 2017-12-23 DIAGNOSIS — I10 Essential (primary) hypertension: Secondary | ICD-10-CM | POA: Diagnosis not present

## 2017-12-23 DIAGNOSIS — K317 Polyp of stomach and duodenum: Secondary | ICD-10-CM

## 2017-12-23 DIAGNOSIS — Z1212 Encounter for screening for malignant neoplasm of rectum: Secondary | ICD-10-CM

## 2017-12-23 DIAGNOSIS — K219 Gastro-esophageal reflux disease without esophagitis: Secondary | ICD-10-CM | POA: Diagnosis not present

## 2017-12-23 DIAGNOSIS — G4733 Obstructive sleep apnea (adult) (pediatric): Secondary | ICD-10-CM | POA: Diagnosis not present

## 2017-12-23 MED ORDER — SODIUM CHLORIDE 0.9 % IV SOLN: 500.0000 mL | Freq: Once | INTRAVENOUS | Status: DC

## 2017-12-23 NOTE — Op Note (Signed)
[facility] Patient Name: Sherry Bell Procedure Date: 12/23/2017 10:18 AM MRN: [id number] Endoscopist: Jerene Bears , MD Age: 72 Referring MD:  Date of Birth: 06/18/1946 Gender: Female Account #: [id number] Procedure:                Colonoscopy Indications:              Screening for colorectal malignant neoplasm, Last                            colonoscopy 10 years ago Medicines:                Monitored Anesthesia Care Procedure:                Pre-Anesthesia Assessment:                           - Prior to the procedure, a History and Physical                            was performed, and patient medications and                            allergies were reviewed. The patient's tolerance of                            previous anesthesia was also reviewed. The risks                            and benefits of the procedure and the sedation                            options and risks were discussed with the patient.                            All questions were answered, and informed consent                            was obtained. Prior Anticoagulants: The patient has                            taken no previous anticoagulant or antiplatelet                            agents. ASA Grade Assessment: III - A patient with                            severe systemic disease. After reviewing the risks                            and benefits, the patient was deemed in                            satisfactory condition to undergo the procedure.  After obtaining informed consent, the colonoscope                            was passed under direct vision. Throughout the                            procedure, the patient's blood pressure, pulse, and                            oxygen saturations were monitored continuously. The                            Model PCF-[id number]) scope was introduced                            through the anus and advanced to  the the cecum,                            identified by appendiceal orifice and ileocecal                            valve. The colonoscopy was performed without                            difficulty. The patient tolerated the procedure                            well. The quality of the bowel preparation was                            good. The ileocecal valve, appendiceal orifice, and                            rectum were photographed. Scope In: 10:32:17 AM Scope Out: 10:50:21 AM Scope Withdrawal Time: 0 hours 13 minutes 7 seconds  Total Procedure Duration: 0 hours 18 minutes 4 seconds  Findings:                 The digital rectal exam was normal.                           A 5 mm polyp was found in the cecum. The polyp was                            sessile. The polyp was removed with a cold snare.                            Resection and retrieval were complete.                           Four sessile polyps were found in the ascending                            colon. The polyps were 4 to 6  mm in size. These                            polyps were removed with a cold snare. Resection                            and retrieval were complete.                           A 2 mm polyp was found in the ascending colon. The                            polyp was sessile. The polyp was removed with a                            cold biopsy forceps. Resection and retrieval were                            complete.                           Two sessile polyps were found in the descending                            colon. The polyps were 4 to 5 mm in size. These                            polyps were removed with a cold snare. Resection                            and retrieval were complete.                           A 4 mm polyp was found in the sigmoid colon. The                            polyp was sessile. The polyp was removed with a                            cold snare. Resection and retrieval  were complete.                           The retroflexed view of the distal rectum and anal                            verge was normal and showed no anal or rectal                            abnormalities. Complications:            No immediate complications. Estimated Blood Loss:     Estimated blood loss was minimal. Impression:               - One 5 mm polyp in the cecum,  removed with a cold                            snare. Resected and retrieved.                           - Four 4 to 6 mm polyps in the ascending colon,                            removed with a cold snare. Resected and retrieved.                           - One 2 mm polyp in the ascending colon, removed                            with a cold biopsy forceps. Resected and retrieved.                           - Two 4 to 5 mm polyps in the descending colon,                            removed with a cold snare. Resected and retrieved.                           - One 4 mm polyp in the sigmoid colon, removed with                            a cold snare. Resected and retrieved.                           - The distal rectum and anal verge are normal on                            retroflexion view. Recommendation:           - Patient has a contact number available for                            emergencies. The signs and symptoms of potential                            delayed complications were discussed with the                            patient. Return to normal activities tomorrow.                            Written discharge instructions were provided to the                            patient.                           - Resume previous diet.                           -  Continue present medications.                           - Await pathology results.                           - Repeat colonoscopy is recommended for                            surveillance. The colonoscopy date will be                             determined after pathology results from today's                            exam become available for review. Jerene Bears, MD 12/23/2017 10:58:07 AM This report has been signed electronically.

## 2017-12-23 NOTE — Op Note (Signed)
[facility] Patient Name: Sanyiah Kanzler Procedure Date: 12/23/2017 10:18 AM MRN: [id number] Endoscopist: Jerene Bears , MD Age: 72 Referring MD:  Date of Birth: 1946/11/04 Gender: Female Account #: [id number] Procedure:                Upper GI endoscopy Indications:              Follow-up of pre-pyloric hyperplastic gastric polyp Medicines:                Monitored Anesthesia Care Procedure:                Pre-Anesthesia Assessment:                           - Prior to the procedure, a History and Physical                            was performed, and patient medications and                            allergies were reviewed. The patient's tolerance of                            previous anesthesia was also reviewed. The risks                            and benefits of the procedure and the sedation                            options and risks were discussed with the patient.                            All questions were answered, and informed consent                            was obtained. Prior Anticoagulants: The patient has                            taken no previous anticoagulant or antiplatelet                            agents. ASA Grade Assessment: III - A patient with                            severe systemic disease. After reviewing the risks                            and benefits, the patient was deemed in                            satisfactory condition to undergo the procedure.                           After obtaining informed consent, the endoscope was  passed under direct vision. Throughout the                            procedure, the patient's blood pressure, pulse, and                            oxygen saturations were monitored continuously. The                            Endoscope was introduced through the mouth, and                            advanced to the second part of duodenum. The upper                            GI  endoscopy was accomplished without difficulty.                            The patient tolerated the procedure well. Scope In: Scope Out: Findings:                 LA Grade A (one or more mucosal breaks less than 5                            mm, not extending between tops of 2 mucosal folds)                            esophagitis was found at the gastroesophageal                            junction.                           The exam of the esophagus was otherwise normal.                           Nodular mucosa was found in the prepyloric region                            of the stomach at the site of prior hyperplastic                            polyp. This was unchanged in appearance compared to                            previous EGD. This was biopsied extensively with a                            cold forceps for histology.                           The exam of the stomach was otherwise normal.  The examined duodenum was normal. Complications:            No immediate complications. Estimated Blood Loss:     Estimated blood loss was minimal. Impression:               - Mild reflux esophagitis.                           - Nodular mucosa in the prepyloric region of the                            stomach. Biopsied.                           - Normal examined duodenum. Recommendation:           - Patient has a contact number available for                            emergencies. The signs and symptoms of potential                            delayed complications were discussed with the                            patient. Return to normal activities tomorrow.                            Written discharge instructions were provided to the                            patient.                           - Resume previous diet.                           - Continue present medications.                           - Await pathology results. Jerene Bears, MD 12/23/2017  10:55:25 AM This report has been signed electronically.

## 2017-12-23 NOTE — Progress Notes (Signed)
Report to PACU, RN, vss, BBS= Clear.  

## 2017-12-23 NOTE — Progress Notes (Signed)
Pt's states no medical or surgical changes since previsit or office visit. 

## 2017-12-23 NOTE — Progress Notes (Signed)
Called to room to assist during endoscopic procedure.  Patient ID and intended procedure confirmed with present staff. Received instructions for my participation in the procedure from the performing physician.  

## 2017-12-23 NOTE — Patient Instructions (Addendum)
HANDOUT GIVEN : POLYPS.    YOU HAD AN ENDOSCOPIC PROCEDURE TODAY AT [facility]:   Refer to the procedure report that was given to you for any specific questions about what was found during the examination.  If the procedure report does not answer your questions, please call your gastroenterologist to clarify.  If you requested that your care partner not be given the details of your procedure findings, then the procedure report has been included in a sealed envelope for you to review at your convenience later.  YOU SHOULD EXPECT: Some feelings of bloating in the abdomen. Passage of more gas than usual.  Walking can help get rid of the air that was put into your GI tract during the procedure and reduce the bloating. If you had a lower endoscopy (such as a colonoscopy or flexible sigmoidoscopy) you may notice spotting of blood in your stool or on the toilet paper. If you underwent a bowel prep for your procedure, you may not have a normal bowel movement for a few days.  Please Note:  You might notice some irritation and congestion in your nose or some drainage.  This is from the oxygen used during your procedure.  There is no need for concern and it should clear up in a day or so.  SYMPTOMS TO REPORT IMMEDIATELY:   Following lower endoscopy (colonoscopy or flexible sigmoidoscopy):  Excessive amounts of blood in the stool  Significant tenderness or worsening of abdominal pains  Swelling of the abdomen that is new, acute  Fever of 100F or higher   Following upper endoscopy (EGD)  Vomiting of blood or coffee ground material  New chest pain or pain under the shoulder blades  Painful or persistently difficult swallowing  New shortness of breath  Fever of 100F or higher  Black, tarry-looking stools  For urgent or emergent issues, a gastroenterologist can be reached at any hour by calling [phone].   DIET:  We do recommend a small meal at first, but then you may proceed  to your regular diet.  Drink plenty of fluids but you should avoid alcoholic beverages for 24 hours.  ACTIVITY:  You should plan to take it easy for the rest of today and you should NOT DRIVE or use heavy machinery until tomorrow (because of the sedation medicines used during the test).    FOLLOW UP: Our staff will call the number listed on your records the next business day following your procedure to check on you and address any questions or concerns that you may have regarding the information given to you following your procedure. If we do not reach you, we will leave a message.  However, if you are feeling well and you are not experiencing any problems, there is no need to return our call.  We will assume that you have returned to your regular daily activities without incident.  If any biopsies were taken you will be contacted by phone or by letter within the next 1-3 weeks.  Please call us at [phone] if you have not heard about the biopsies in 3 weeks.    SIGNATURES/CONFIDENTIALITY: You and/or your care partner have signed paperwork which will be entered into your electronic medical record.  These signatures attest to the fact that that the information above on your After Visit Summary has been reviewed and is understood.  Full responsibility of the confidentiality of this discharge information lies with you and/or your care-partner.

## 2017-12-24 ENCOUNTER — Telehealth: Payer: Self-pay | Admitting: *Deleted

## 2017-12-24 NOTE — Telephone Encounter (Signed)
  Follow up Call-  Call back number 12/23/2017 07/23/2016  Post procedure Call Back phone  # [phone]  Permission to leave phone message Yes Yes  Some recent data might be hidden     Patient questions:  Do you have a fever, pain , or abdominal swelling? No. Pain Score  0 *  Have you tolerated food without any problems? Yes.    Have you been able to return to your normal activities? Yes.    Do you have any questions about your discharge instructions: Diet   No. Medications  No. Follow up visit  No.  Do you have questions or concerns about your Care? No.  Actions: * If pain score is 4 or above: No action needed, pain <4.

## 2017-12-26 ENCOUNTER — Encounter: Payer: Self-pay | Admitting: Internal Medicine

## 2018-01-01 DIAGNOSIS — M7661 Achilles tendinitis, right leg: Secondary | ICD-10-CM | POA: Diagnosis not present

## 2018-01-01 DIAGNOSIS — M898X7 Other specified disorders of bone, ankle and foot: Secondary | ICD-10-CM | POA: Diagnosis not present

## 2018-01-01 DIAGNOSIS — M6701 Short Achilles tendon (acquired), right ankle: Secondary | ICD-10-CM | POA: Diagnosis not present

## 2018-01-13 DIAGNOSIS — M7661 Achilles tendinitis, right leg: Secondary | ICD-10-CM | POA: Diagnosis not present

## 2018-01-16 DIAGNOSIS — R51 Headache: Secondary | ICD-10-CM | POA: Diagnosis not present

## 2018-01-16 DIAGNOSIS — H6981 Other specified disorders of Eustachian tube, right ear: Secondary | ICD-10-CM | POA: Diagnosis not present

## 2018-01-16 DIAGNOSIS — R05 Cough: Secondary | ICD-10-CM | POA: Diagnosis not present

## 2018-01-20 ENCOUNTER — Telehealth: Payer: Self-pay | Admitting: Endocrinology

## 2018-01-20 NOTE — Telephone Encounter (Signed)
Ok to refill since you have never written Cytomel 25 mcg?

## 2018-01-20 NOTE — Telephone Encounter (Signed)
Pt needs new Rx for Cytomel 25 mcg sent to [facility] on [address] (PCP usually prescribes this medication but pt wants Dr Dwyane Dee to prescribe it now). Patient is almost out of medication.

## 2018-01-20 NOTE — Telephone Encounter (Signed)
Please send prescription, half tablet daily

## 2018-01-21 MED ORDER — LIOTHYRONINE SODIUM 25 MCG PO TABS: 12.5000 ug | ORAL_TABLET | Freq: Every day | ORAL | 0 refills | Status: DC

## 2018-01-21 NOTE — Telephone Encounter (Signed)
Rx sent. See meds. Pt informed.  

## 2018-01-22 DIAGNOSIS — M7661 Achilles tendinitis, right leg: Secondary | ICD-10-CM | POA: Diagnosis not present

## 2018-01-24 ENCOUNTER — Encounter: Payer: Self-pay | Admitting: *Deleted

## 2018-01-29 DIAGNOSIS — M7661 Achilles tendinitis, right leg: Secondary | ICD-10-CM | POA: Diagnosis not present

## 2018-02-03 DIAGNOSIS — M7661 Achilles tendinitis, right leg: Secondary | ICD-10-CM | POA: Diagnosis not present

## 2018-02-04 ENCOUNTER — Ambulatory Visit: Payer: PPO | Admitting: Internal Medicine

## 2018-02-04 ENCOUNTER — Encounter: Payer: Self-pay | Admitting: Internal Medicine

## 2018-02-04 VITALS — BP 130/76 | HR 76 | Ht 63.0 in | Wt 230.0 lb

## 2018-02-04 DIAGNOSIS — K6289 Other specified diseases of anus and rectum: Secondary | ICD-10-CM | POA: Diagnosis not present

## 2018-02-04 DIAGNOSIS — K2100 Gastro-esophageal reflux disease with esophagitis, without bleeding: Secondary | ICD-10-CM

## 2018-02-04 DIAGNOSIS — Z86010 Personal history of colonic polyps: Secondary | ICD-10-CM | POA: Diagnosis not present

## 2018-02-04 MED ORDER — RANITIDINE HCL 150 MG PO TABS: 150.0000 mg | ORAL_TABLET | Freq: Two times a day (BID) | ORAL | 2 refills | Status: DC

## 2018-02-04 MED ORDER — HYDROCORTISONE ACE-PRAMOXINE 2.5-1 % RE CREA: 1.0000 "application " | TOPICAL_CREAM | Freq: Three times a day (TID) | RECTAL | 0 refills | Status: DC

## 2018-02-04 NOTE — Progress Notes (Signed)
Subjective:    Patient ID: Sherry Bell, female    DOB: November 07, 1946, 72 y.o.   MRN: [id number]  HPI Wilhelmine Krogstad is a 72 year old female with a history of GERD with esophagitis, gastritis with history of hyperplastic gastric polyp, history of H. pylori status post treatment, functional dyspepsia, adenomatous colon polyps who is here for follow-up.  She was last seen at the time of her colonoscopy and endoscopy in January 2019.  EGD on 12/23/2017 --mild reflux esophagitis, nodular prepyloric mucosa at the site of prior hyperplastic polyp.  Additional biopsies performed.  Otherwise normal.  Path = reactive gastropathy, no H. pylori or intestinal metaplasia.  Colonoscopy on 12/23/2017 --9 polyps removed from the cecum, ascending colon, descending colon and sigmoid the greatest about 6 mm.  6 of these were tubular adenoma, the other 3 non-adenomatous.  She returns today stating that she has been feeling well.  She has been using Zantac 150 twice daily on an as-needed basis for reflux.  This is needed mostly in the evening and about 3 days/week.  No dysphagia or odynophagia.  No abdominal pain.  She is been more careful with antireflux diet and precautions.  This is been helpful.  Bowel movements have been regular without melena or rectal bleeding.  No diarrhea or constipation.  Her only concern is that she feels a perianal "bulging" which she feels may be a hemorrhoid.  This makes it difficult to clean after bowel movement and can be sore.  She uses a bidet at home to help clean   Review of Systems As per HPI, otherwise negative  Current Medications, Allergies, Past Medical History, Past Surgical History, Family History and Social History were reviewed in [employer] record.     Objective:   Physical Exam BP 130/76   Pulse 76   Ht 5\' 3"  (1.6 m)   Wt 230 lb (104.3 kg)   BMI 40.74 kg/m  Constitutional: Well-developed and well-nourished. No distress. HEENT:  Normocephalic and atraumatic.  Conjunctivae are normal.  No scleral icterus. Neck: Neck supple. Trachea midline. Cardiovascular: Normal rate, regular rhythm and intact distal pulses.  Pulmonary/chest: Effort normal and breath sounds normal. No wheezing, rales or rhonchi. Abdominal: Soft, nontender, nondistended. Bowel sounds active throughout.  Rectal: Anterior external redundant skin and small posterior skin tag without external hemorrhoids or tenderness, mild perianal erythema Extremities: no clubbing, cyanosis, or edema Neurological: Alert and oriented to person place and time. Skin: Skin is warm and dry. Psychiatric: Normal mood and affect. Behavior is normal.     Assessment & Plan:  72 year old female with a history of GERD with esophagitis, gastritis with history of hyperplastic gastric polyp, history of H. pylori status post treatment, functional dyspepsia, adenomatous colon polyps who is here for follow-up.   1.  GERD with history of esophagitis --she is doing well with antireflux diet and lifestyle modification.  She will continue ranitidine 150 mg twice daily as needed.  2.  History of hyperplastic gastric polyp and H. Pylori --no further evidence of H. pylori or hyperplastic gastric polyp.  Surveillance endoscopy performed recently very reassuring.  We do not need to continue surveillance for this issue.  3.  History of adenomatous colon polyps --6 adenomas removed at recent colonoscopy, repeat recommended in 3 years  4.  Perianal pain --she is having discomfort perianally related to mild perianal irritation without evidence of overt fungal infection.  What she feels to be a "hemorrhoid" is actually redundant skin anteriorly.  There  is a small posterior skin tag.  I do not think this would respond to banding given the lack of internal hemorrhoid seen recently.  Also do not think this warrants surgical opinion.  I am going to have her use Analpram twice daily for about a week to try to  shrink the perianal tissue and then switch to creamy Desitin after bathing and bowel movement as a skin protectant and anti-irritant.  She is asked to notify me if she continues to have discomfort.  She voices understanding  Annual follow-up, sooner if needed 25 minutes spent with the patient today. Greater than 50% was spent in counseling and coordination of care with the patient

## 2018-02-04 NOTE — Patient Instructions (Signed)
We have sent the following medications to your pharmacy for you to pick up at your convenience: Ranitidine twice daily as needed analpram twice daily  Please purchase the following medications over the counter and take as directed: Creamy Desitin-use after bathing and bowel movements-if not better after 1 month, please call our office at [phone].  If you are age 70 or older, your body mass index should be between 23-30. Your Body mass index is 40.74 kg/m. If this is out of the aforementioned range listed, please consider follow up with your Primary Care Provider.  If you are age 40 or younger, your body mass index should be between 19-25. Your Body mass index is 40.74 kg/m. If this is out of the aformentioned range listed, please consider follow up with your Primary Care Provider.

## 2018-02-11 DIAGNOSIS — M7661 Achilles tendinitis, right leg: Secondary | ICD-10-CM | POA: Diagnosis not present

## 2018-02-21 ENCOUNTER — Other Ambulatory Visit: Payer: Self-pay | Admitting: Endocrinology

## 2018-03-16 IMAGING — CR DG CHEST 2V
2 series · 2 of 2 positions shown · non-contrast
Comparison: 04/22/2015

CLINICAL DATA: Fever, body aches for 1 day.

EXAM:
CHEST  2 VIEW

[w chest pa]
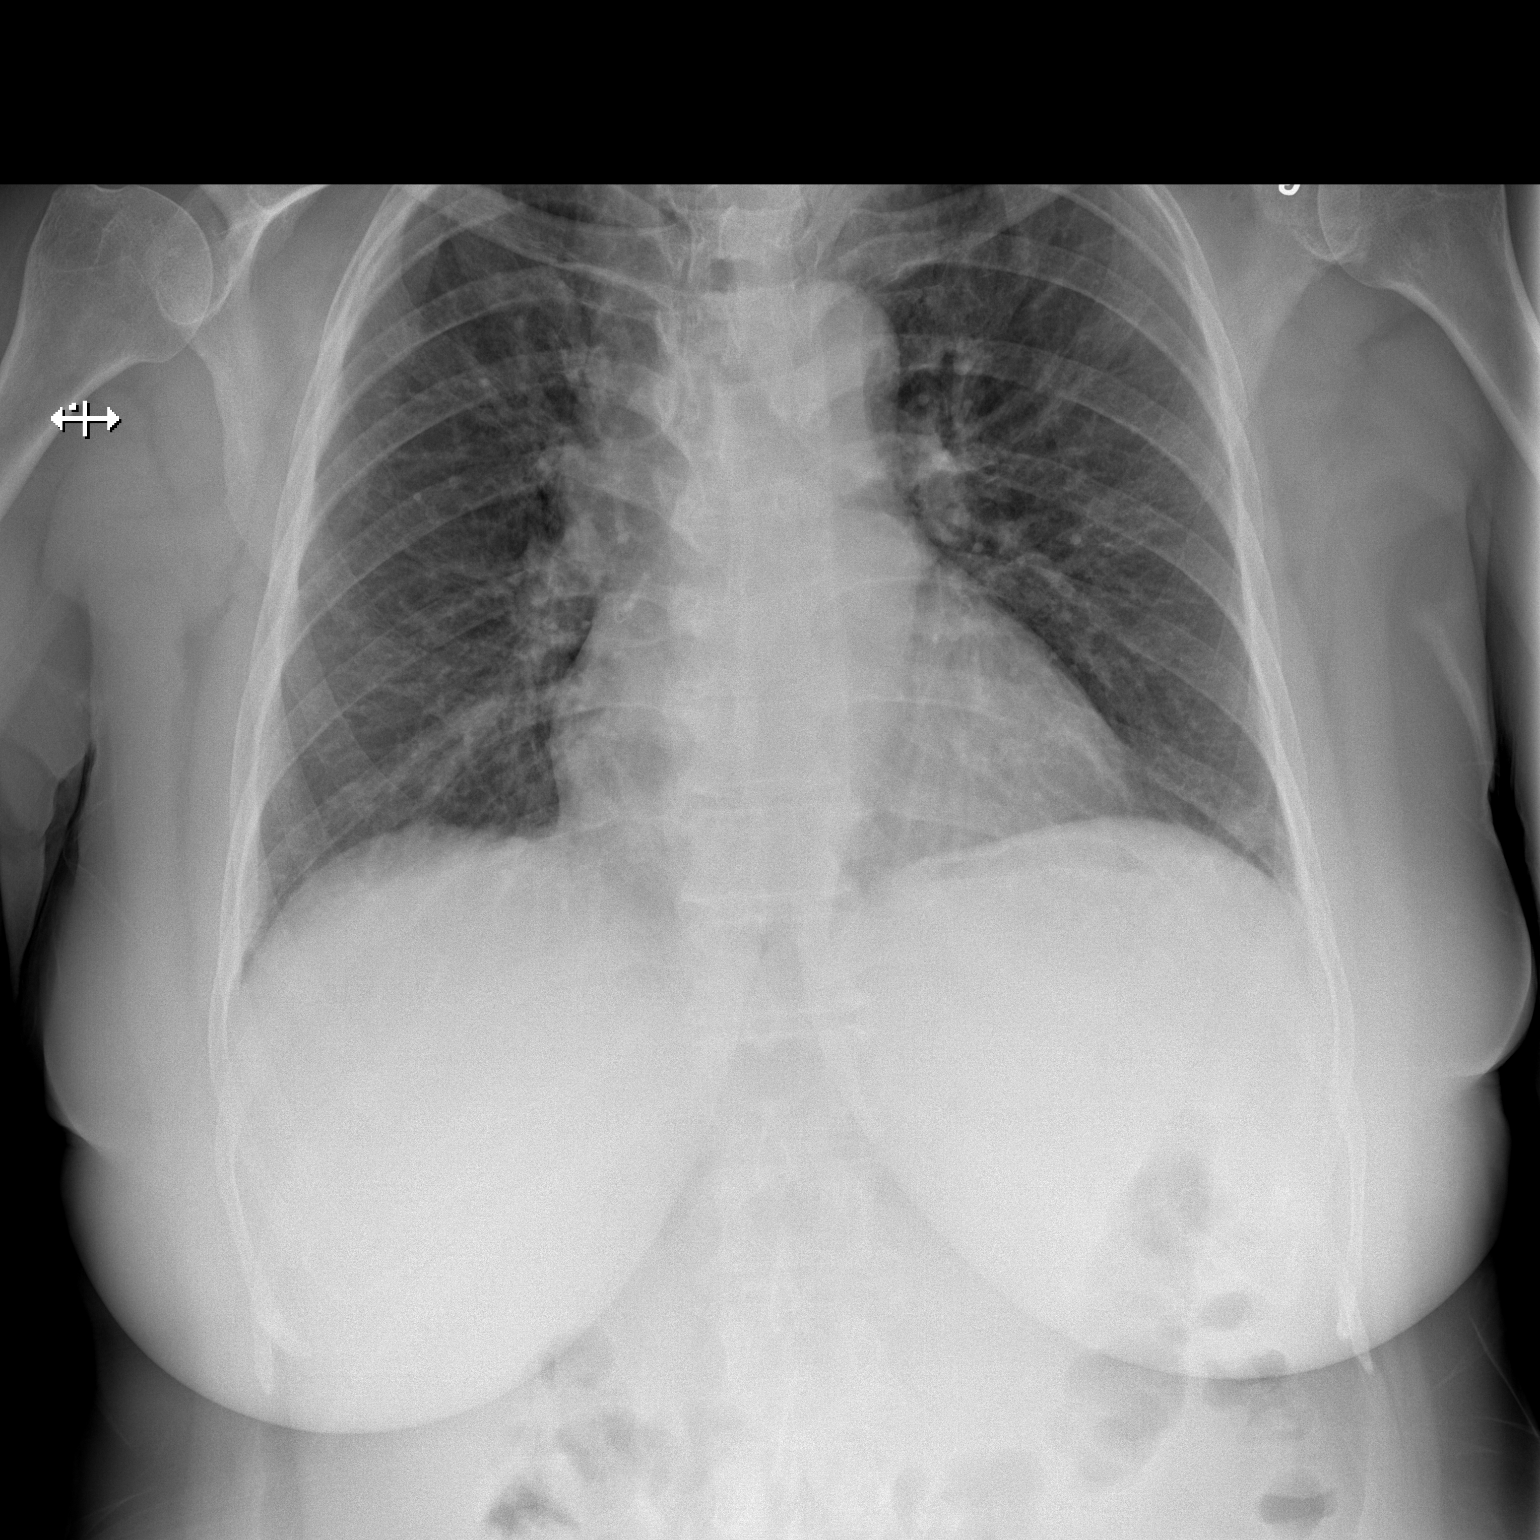

[w chest lat]
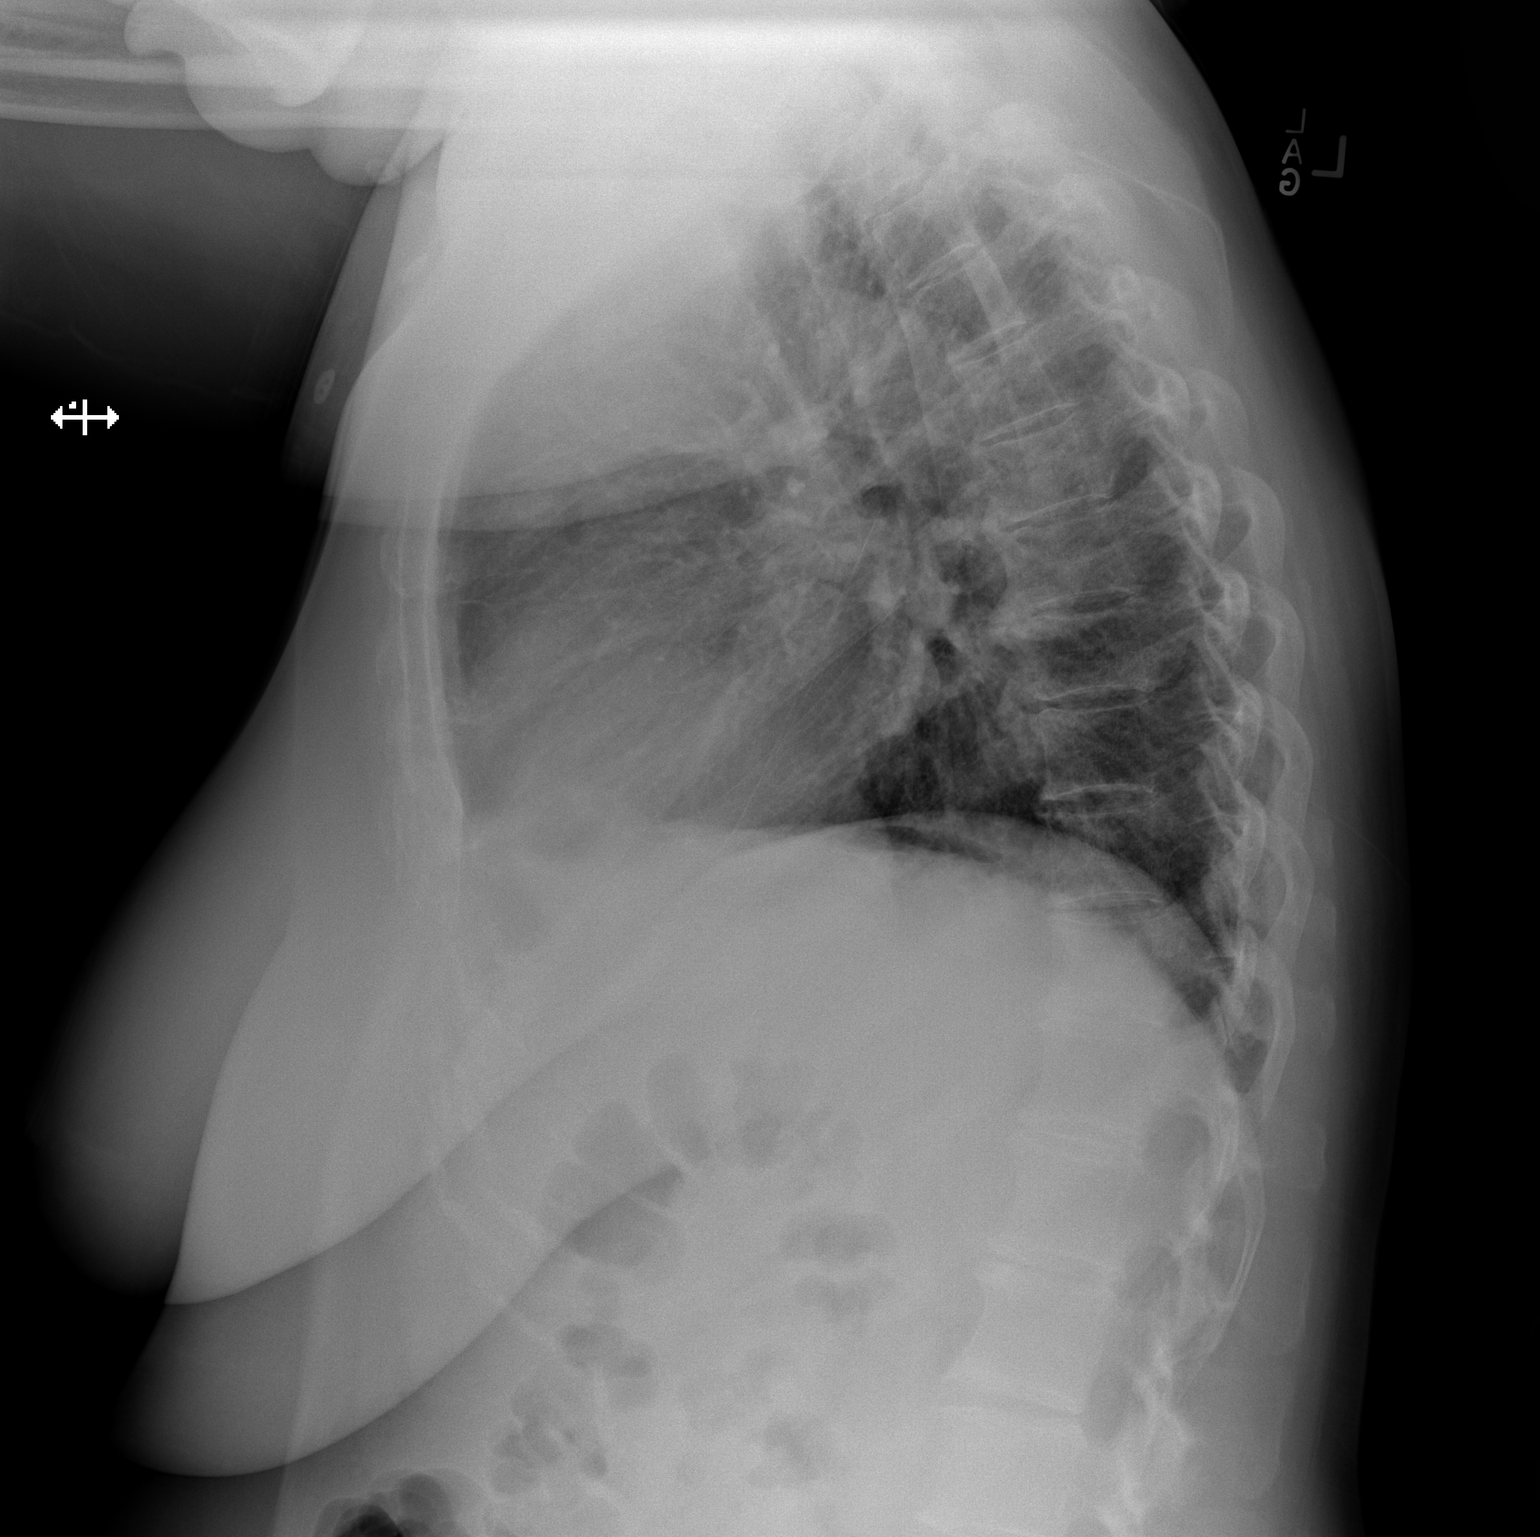

[2 of 2 positions shown; findings below may reference images not displayed]

FINDINGS: The cardiomediastinal contours are normal. Mild vascular congestion.
Minimal bibasilar atelectasis. No consolidation, pleural effusion,
or pneumothorax. No acute osseous abnormalities are seen.
IMPRESSION: Mild vascular congestion and minimal bibasilar atelectasis. No
evidence of pneumonia.

## 2018-03-19 DIAGNOSIS — G4733 Obstructive sleep apnea (adult) (pediatric): Secondary | ICD-10-CM | POA: Diagnosis not present

## 2018-03-24 ENCOUNTER — Other Ambulatory Visit (INDEPENDENT_AMBULATORY_CARE_PROVIDER_SITE_OTHER): Payer: PPO

## 2018-03-24 DIAGNOSIS — E038 Other specified hypothyroidism: Secondary | ICD-10-CM | POA: Diagnosis not present

## 2018-03-24 LAB — TSH: TSH: 2.62 u[IU]/mL (ref 0.35–4.50)

## 2018-03-24 LAB — T4, FREE: Free T4: 0.71 ng/dL (ref 0.60–1.60)

## 2018-03-24 LAB — T3, FREE: T3, Free: 3.3 pg/mL (ref 2.3–4.2)

## 2018-03-26 DIAGNOSIS — D229 Melanocytic nevi, unspecified: Secondary | ICD-10-CM | POA: Diagnosis not present

## 2018-03-26 DIAGNOSIS — L304 Erythema intertrigo: Secondary | ICD-10-CM | POA: Diagnosis not present

## 2018-03-26 DIAGNOSIS — L821 Other seborrheic keratosis: Secondary | ICD-10-CM | POA: Diagnosis not present

## 2018-03-31 ENCOUNTER — Ambulatory Visit: Payer: PPO | Admitting: Endocrinology

## 2018-03-31 ENCOUNTER — Encounter: Payer: Self-pay | Admitting: Endocrinology

## 2018-03-31 VITALS — BP 134/76 | HR 84 | Ht 63.0 in | Wt 230.0 lb

## 2018-03-31 DIAGNOSIS — E038 Other specified hypothyroidism: Secondary | ICD-10-CM

## 2018-03-31 NOTE — Progress Notes (Signed)
Patient ID: Sherry Bell, female   DOB: June 10, 1946, 72 y.o.   MRN: [id number]           Reason for Appointment: Follow-up of hypothyroidism and of thyroid cyst    History of Present Illness:    HYPOTHYROIDISM:  This was diagnosed probably in the 1990s when the patient had gone to her physician because of weight gain. She does not think she had any unusual fatigue at that time She was found to be hypothyroid and has been on thyroid supplements since then.  She does not think she felt any different with taking the medication but her weight stabilized.  Her previous endocrinologist put her on combination of Synthroid and Cytomel 25 g, 1/2 tablet, this was prescribed twice a day  Subsequently she had been taking the Cytomel only in the morning since she would forget the evening dose  Currently on Synthroid 100 g and Cytomel 12.5 g in the morning She now says that she is not feeling unusual fatigue  In 11/18 she was reporting that she feels sleepy for an hour after taking her thyroid medications However now she does not have this symptom and is taking both her tablets in the morning before breakfast very consistently  Wt Readings from Last 3 Encounters:  03/31/18 230 lb (104.3 kg)  02/04/18 230 lb (104.3 kg)  12/23/17 230 lb (104.3 kg)    TSH is again normal although slightly better No change significantly with her T3 and T4 levels  THYROID cyst:  The patient's thyroid nodule was first discovered in early 2016 soon after she had a respiratory infection in 11/2014 She had felt a swelling on the left side of her neck and was seen by her PCP for evaluation in 3/16 She did not have any swelling prior to that.  She does not feel that her neck swelling has increased in size and it may have moved more towards the center of the neck  The thyroid ultrasound showed the following Dominant simple appearing cyst occupies much of the left lobe and measures approximately 5.2 x 5.4 x 4.6 cm.    This cyst does not have any complex features or mural nodularity. On review of her previous records she had a predominantly cystic lesion in the upper pole of her thyroid measuring 1.7 cm in 2003 This was biopsied in 2002 and was benign Also she had a subcentimeter cyst in the lower left pole of the left side in 2003  RECENT history: Subsequently she had a increase in size of her left thyroid nodule with pressure sensation Aspiration of this cyst was successfully done in June 2017 by Dr. Loanne Drilling and about 100 mL of fluid removed which was negative for cytology  She does not feel any swelling in her neck recently   Lab Results  Component Value Date   FREET4 0.71 03/24/2018   FREET4 0.78 09/17/2017   FREET4 0.83 02/12/2017   TSH 2.62 03/24/2018   TSH 3.51 09/17/2017   TSH 2.47 02/12/2017   Lab Results  Component Value Date   T3FREE 3.3 03/24/2018   T3FREE 3.0 09/17/2017   T3FREE 3.4 02/12/2017    Allergies as of 03/31/2018      Reactions   Pneumococcal 13-val Conj Vacc Other (See Comments)   Codeine Nausea And Vomiting, Rash, Nausea Only   Tetanus-diphth-acell Pertussis Palpitations      Medication List        Accurate as of 03/31/18 10:16 AM. Always use your most  recent med list.          cholecalciferol 1000 units tablet Commonly known as:  VITAMIN D Take 7,000 Units by mouth daily.   hydrocortisone-pramoxine 2.5-1 % rectal cream Commonly known as:  ANALPRAM HC Place 1 application rectally 3 (three) times daily.   liothyronine 25 MCG tablet Commonly known as:  CYTOMEL Take 0.5 tablets (12.5 mcg total) by mouth daily.   Magnesium 300 MG Caps Take 1 capsule by mouth daily. Reported on 02/14/2016   meloxicam 15 MG tablet Commonly known as:  MOBIC meloxicam 15 mg tablet  TAKE 1 TABLET BY MOUTH EVERY DAY   ranitidine 150 MG tablet Commonly known as:  ZANTAC Take 1 tablet (150 mg total) by mouth 2 (two) times daily.   spironolactone 25 MG tablet Commonly  known as:  ALDACTONE Take 25 mg by mouth daily.   SYNTHROID 100 MCG tablet Generic drug:  levothyroxine TAKE 1 TABLET BY MOUTH EVERY DAY       Allergies:  Allergies  Allergen Reactions  . Pneumococcal 13-Val Conj Vacc Other (See Comments)  . Codeine Nausea And Vomiting, Rash and Nausea Only  . Tetanus-Diphth-Acell Pertussis Palpitations    Past Medical History:  Diagnosis Date  . Allergy   . Anxiety   . Arthritis   . Cataract   . Cystocele   . Fibroids   . Gastric reflux   . Generalized headaches   . H. pylori infection    8 years ago  . Heart murmur    Mild mitral regurgitation- pt denies this 07-02-16 at her [facility]  . Hiatal hernia   . Hypertension   . Obstructive sleep apnea    uses CPAP  . Pain in limb   . Sleep apnea    uses CPAP  . Thyroid disease   . Tubular adenoma of colon   . Varicose veins     There is no history of radiation to the neck in childhood  Past Surgical History:  Procedure Laterality Date  . BREATH TEK H PYLORI N/A 05/14/2016   Procedure: BREATH TEK H PYLORI;  Surgeon: Jerene Bears, MD;  Location: [facility];  Service: Gastroenterology;  Laterality: N/A;  . COLONOSCOPY     Dr Lajoyce Corners  . ESOPHAGOGASTRODUODENOSCOPY     Dr Lajoyce Corners  . TONSILECTOMY, ADENOIDECTOMY, [place]    . UPPER GASTROINTESTINAL ENDOSCOPY      Family History  Problem Relation Age of Onset  . Throat cancer Father        worked at [employer]  . Hypothyroidism Sister   . Kidney disease Mother   . Colon cancer Neg Hx   . Esophageal cancer Neg Hx   . Rectal cancer Neg Hx   . Stomach cancer Neg Hx   . Colon polyps Neg Hx     Social History:  reports that she has never smoked. She has never used smokeless tobacco. She reports that she does not drink alcohol or use drugs.   Review of Systems:      She has had sleep apnea diagnosed in 2013 using CPAP, using this regularly         Examination:   BP 134/76 (BP Location: Left Arm, Patient Position:  Sitting, Cuff Size: Large)   Pulse 84   Ht 5\' 3"  (1.6 m)   Wt 230 lb (104.3 kg)   SpO2 97%   BMI 40.74 kg/m        Thyroid is not palpable  Biceps  reflexes are normal   Assessment/Plan:  Thyroid cyst on the left side which was completely resolved with aspiration in June 2017 and has not recurred We will continue to follow clinically  HYPOTHYROIDISM: She has had nonspecific fatigue and sleepiness but this is better now She is compliant with her supplement No typical symptoms of hypothyroidism now and her labs are excellent She can continue to take levothyroxine and half tablet sodium normal consistently before breakfast in the morning  Follow-up in 6 months  Chalee Hirota Dwyane Dee 03/31/2018

## 2018-06-10 DIAGNOSIS — G4733 Obstructive sleep apnea (adult) (pediatric): Secondary | ICD-10-CM | POA: Diagnosis not present

## 2018-06-25 DIAGNOSIS — R8290 Unspecified abnormal findings in urine: Secondary | ICD-10-CM | POA: Diagnosis not present

## 2018-06-25 DIAGNOSIS — E039 Hypothyroidism, unspecified: Secondary | ICD-10-CM | POA: Diagnosis not present

## 2018-06-25 DIAGNOSIS — G4730 Sleep apnea, unspecified: Secondary | ICD-10-CM | POA: Diagnosis not present

## 2018-06-25 DIAGNOSIS — Z136 Encounter for screening for cardiovascular disorders: Secondary | ICD-10-CM | POA: Diagnosis not present

## 2018-06-25 DIAGNOSIS — M47812 Spondylosis without myelopathy or radiculopathy, cervical region: Secondary | ICD-10-CM | POA: Diagnosis not present

## 2018-06-25 DIAGNOSIS — K219 Gastro-esophageal reflux disease without esophagitis: Secondary | ICD-10-CM | POA: Diagnosis not present

## 2018-06-25 DIAGNOSIS — L989 Disorder of the skin and subcutaneous tissue, unspecified: Secondary | ICD-10-CM | POA: Diagnosis not present

## 2018-06-25 DIAGNOSIS — Z0000 Encounter for general adult medical examination without abnormal findings: Secondary | ICD-10-CM | POA: Diagnosis not present

## 2018-06-25 DIAGNOSIS — R7301 Impaired fasting glucose: Secondary | ICD-10-CM | POA: Diagnosis not present

## 2018-06-25 DIAGNOSIS — I10 Essential (primary) hypertension: Secondary | ICD-10-CM | POA: Diagnosis not present

## 2018-06-25 DIAGNOSIS — E7800 Pure hypercholesterolemia, unspecified: Secondary | ICD-10-CM | POA: Diagnosis not present

## 2018-06-26 DIAGNOSIS — H25813 Combined forms of age-related cataract, bilateral: Secondary | ICD-10-CM | POA: Diagnosis not present

## 2018-06-26 DIAGNOSIS — H353121 Nonexudative age-related macular degeneration, left eye, early dry stage: Secondary | ICD-10-CM | POA: Diagnosis not present

## 2018-06-26 DIAGNOSIS — H524 Presbyopia: Secondary | ICD-10-CM | POA: Diagnosis not present

## 2018-06-26 DIAGNOSIS — H10413 Chronic giant papillary conjunctivitis, bilateral: Secondary | ICD-10-CM | POA: Diagnosis not present

## 2018-07-27 ENCOUNTER — Encounter (HOSPITAL_BASED_OUTPATIENT_CLINIC_OR_DEPARTMENT_OTHER): Payer: Self-pay | Admitting: Emergency Medicine

## 2018-07-27 ENCOUNTER — Emergency Department (HOSPITAL_BASED_OUTPATIENT_CLINIC_OR_DEPARTMENT_OTHER)
Admission: EM | Admit: 2018-07-27 | Discharge: 2018-07-27 | Disposition: A | Discharge status: Home / Self Care | Payer: PPO | Attending: Emergency Medicine | Admitting: Emergency Medicine

## 2018-07-27 ENCOUNTER — Other Ambulatory Visit: Payer: Self-pay

## 2018-07-27 DIAGNOSIS — R519 Headache, unspecified: Secondary | ICD-10-CM

## 2018-07-27 DIAGNOSIS — I10 Essential (primary) hypertension: Secondary | ICD-10-CM | POA: Diagnosis not present

## 2018-07-27 DIAGNOSIS — Z79899 Other long term (current) drug therapy: Secondary | ICD-10-CM | POA: Diagnosis not present

## 2018-07-27 DIAGNOSIS — R51 Headache: Secondary | ICD-10-CM | POA: Insufficient documentation

## 2018-07-27 LAB — COMPREHENSIVE METABOLIC PANEL
ALT: 21 U/L (ref 0–44)
AST: 24 U/L (ref 15–41)
Albumin: 4.2 g/dL (ref 3.5–5.0)
Alkaline Phosphatase: 45 U/L (ref 38–126)
Anion gap: 10 (ref 5–15)
BUN: 13 mg/dL (ref 8–23)
CO2: 27 mmol/L (ref 22–32)
Calcium: 8.8 mg/dL — ABNORMAL LOW (ref 8.9–10.3)
Chloride: 103 mmol/L (ref 98–111)
Creatinine, Ser: 0.76 mg/dL (ref 0.44–1.00)
GFR calc Af Amer: >60 mL/min (ref >60)
GFR calc non Af Amer: >60 mL/min (ref >60)
Glucose, Bld: 91 mg/dL (ref 70–99)
Potassium: 4.4 mmol/L (ref 3.5–5.1)
Sodium: 140 mmol/L (ref 135–145)
Total Bilirubin: 0.9 mg/dL (ref 0.3–1.2)
Total Protein: 7.9 g/dL (ref 6.5–8.1)

## 2018-07-27 LAB — CBC WITH DIFFERENTIAL/PLATELET
Basophils Absolute: 0 10*3/uL (ref 0.0–0.1)
Basophils Relative: 1 %
Eosinophils Absolute: 0.2 10*3/uL (ref 0.0–0.7)
Eosinophils Relative: 3 %
HCT: 39.5 % (ref 36.0–46.0)
Hemoglobin: 13 g/dL (ref 12.0–15.0)
Lymphocytes Relative: 30 %
Lymphs Abs: 2.1 10*3/uL (ref 0.7–4.0)
MCH: 30.2 pg (ref 26.0–34.0)
MCHC: 32.9 g/dL (ref 30.0–36.0)
MCV: 91.9 fL (ref 78.0–100.0)
Monocytes Absolute: 0.5 10*3/uL (ref 0.1–1.0)
Monocytes Relative: 7 %
Neutro Abs: 4.3 10*3/uL (ref 1.7–7.7)
Neutrophils Relative %: 59 %
Platelets: 193 10*3/uL (ref 150–400)
RBC: 4.3 MIL/uL (ref 3.87–5.11)
RDW: 13.2 % (ref 11.5–15.5)
WBC: 7.1 10*3/uL (ref 4.0–10.5)

## 2018-07-27 NOTE — ED Triage Notes (Signed)
Pt c/o high BP readings and headache since yesterday. She has a history of HTN.

## 2018-07-27 NOTE — ED Notes (Signed)
ED Provider at bedside. 

## 2018-07-27 NOTE — Discharge Instructions (Signed)
Your laboratory testing today was reassuring.  Your exam was reassuring.  After our conversation, I suspect your headaches are driving her blood pressure higher than normal in the setting of your recent dental procedures irritating your TMJ on the right side.  Please follow-up both with your primary doctor and your dentist for reevaluation and further management.  As your blood pressure has improved here in the emergency department and your headache has resolved, we do not recommend changing your blood pressure regimen until contacting your primary doctor.  If any symptoms change or worsen or you start having other concerning symptoms, please return to the nearest emergency department.

## 2018-07-27 NOTE — ED Provider Notes (Signed)
[facility] Provider Note   CSN: [id number] Arrival date & time: 07/27/18  1743     History   Chief Complaint Chief Complaint  Patient presents with  . Headache  . Hypertension    HPI Sherry Bell is a 72 y.o. female.  The history is provided by the patient, medical records and a relative. No language interpreter was used.  Headache   This is a recurrent problem. The current episode started more than 2 days ago. The problem occurs constantly. The problem has not changed since onset.The headache is associated with nothing. The pain is located in the temporal region. The quality of the pain is described as sharp and dull. The pain is mild. The pain does not radiate. Pertinent negatives include no fever, no chest pressure, no palpitations, no syncope, no shortness of breath, no nausea and no vomiting. She has tried nothing for the symptoms. The treatment provided no relief.    Past Medical History:  Diagnosis Date  . Allergy   . Anxiety   . Arthritis   . Cataract   . Cystocele   . Fibroids   . Gastric reflux   . Generalized headaches   . H. pylori infection    8 years ago  . Heart murmur    Mild mitral regurgitation- pt denies this 07-02-16 at [facility]  . Hiatal hernia   . Hypertension   . Obstructive sleep apnea    uses CPAP  . Pain in limb   . Sleep apnea    uses CPAP  . Thyroid disease   . Tubular adenoma of colon   . Varicose veins     Patient Active Problem List   Diagnosis Date Noted  . Thyroid cyst 04/30/2016  . Adult onset hypothyroidism 03/07/2015  . Hypertension   . Fibroids     Past Surgical History:  Procedure Laterality Date  . BREATH TEK H PYLORI N/A 05/14/2016   Procedure: BREATH TEK H PYLORI;  Surgeon: Jerene Bears, MD;  Location: [facility];  Service: Gastroenterology;  Laterality: N/A;  . COLONOSCOPY     Dr Lajoyce Corners  . ESOPHAGOGASTRODUODENOSCOPY     Dr Lajoyce Corners  . TONSILECTOMY, ADENOIDECTOMY, [place]    . UPPER GASTROINTESTINAL ENDOSCOPY       OB History   None      Home Medications    Prior to Admission medications   Medication Sig Start Date End Date Taking? Authorizing Provider  liothyronine (CYTOMEL) 25 MCG tablet Take 0.5 tablets (12.5 mcg total) by mouth daily. 01/21/18  Yes Elayne Snare, MD  Magnesium 300 MG CAPS Take 1 capsule by mouth daily. Reported on 02/14/2016   Yes [provider]  ranitidine (ZANTAC) 150 MG tablet Take 1 tablet (150 mg total) by mouth 2 (two) times daily. 02/04/18  Yes Pyrtle, Lajuan Lines, MD  spironolactone (ALDACTONE) 25 MG tablet Take 25 mg by mouth daily.   Yes [provider]  SYNTHROID 100 MCG tablet TAKE 1 TABLET BY MOUTH EVERY DAY 02/21/18  Yes Elayne Snare, MD  cholecalciferol (VITAMIN D) 1000 UNITS tablet Take 7,000 Units by mouth daily.     [provider]  hydrocortisone-pramoxine (ANALPRAM HC) 2.5-1 % rectal cream Place 1 application rectally 3 (three) times daily. 02/04/18   Pyrtle, Lajuan Lines, MD  meloxicam (MOBIC) 15 MG tablet meloxicam 15 mg tablet  TAKE 1 TABLET BY MOUTH EVERY DAY    [provider]  Family History Family History  Problem Relation Age of Onset  . Throat cancer Father        worked at [employer]  . Hypothyroidism Sister   . Kidney disease Mother   . Colon cancer Neg Hx   . Esophageal cancer Neg Hx   . Rectal cancer Neg Hx   . Stomach cancer Neg Hx   . Colon polyps Neg Hx     Social History Social History   Tobacco Use  . Smoking status: Never Smoker  . Smokeless tobacco: Never Used  Substance Use Topics  . Alcohol use: No  . Drug use: No     Allergies   Pneumococcal 13-val conj vacc; Codeine; and Tetanus-diphth-acell pertussis   Review of Systems Review of Systems  Constitutional: Negative for chills, diaphoresis, fatigue and fever.  HENT: Positive for dental problem. Negative for congestion, rhinorrhea, tinnitus, trouble swallowing and voice change.   Eyes:  Negative for photophobia and visual disturbance.  Respiratory: Negative for cough, chest tightness, shortness of breath and wheezing.   Cardiovascular: Negative for chest pain, palpitations and syncope.  Gastrointestinal: Negative for abdominal pain, constipation, diarrhea, nausea and vomiting.  Genitourinary: Negative for dysuria and flank pain.  Musculoskeletal: Negative for back pain, neck pain and neck stiffness.  Neurological: Positive for headaches. Negative for dizziness and light-headedness.  Psychiatric/Behavioral: Negative for agitation.  All other systems reviewed and are negative.    Physical Exam Updated Vital Signs BP (!) 158/88 (BP Location: Left Arm)   Pulse 72   Temp 98.3 F (36.8 C) (Oral)   Resp 16   Ht 5\' 3"  (1.6 m)   Wt 104.3 kg   SpO2 98%   BMI 40.74 kg/m   Physical Exam  Constitutional: She is oriented to person, place, and time. She appears well-developed and well-nourished.  Non-toxic appearance. She does not appear ill. No distress.  HENT:  Head: Normocephalic and atraumatic.  Nose: Nose normal.  Mouth/Throat: Oropharynx is clear and moist. No oropharyngeal exudate.  Eyes: Pupils are equal, round, and reactive to light. Conjunctivae and EOM are normal.  Neck: Normal range of motion. Neck supple.  Cardiovascular: Normal rate, regular rhythm and normal heart sounds.  No murmur heard. Pulmonary/Chest: Effort normal and breath sounds normal. No stridor. No respiratory distress. She has no wheezes. She has no rales. She exhibits no tenderness.  Abdominal: Soft. She exhibits no distension. There is no tenderness.  Musculoskeletal: She exhibits no edema or tenderness.  Neurological: She is alert and oriented to person, place, and time. She is not disoriented. She displays no tremor. No cranial nerve deficit or sensory deficit. She exhibits normal muscle tone. She displays no seizure activity. Coordination and gait normal. GCS eye subscore is 4. GCS verbal  subscore is 5. GCS motor subscore is 6.  Skin: Skin is warm and dry. Capillary refill takes less than 2 seconds. No rash noted. She is not diaphoretic. No erythema.  Psychiatric: She has a normal mood and affect.  Nursing note and vitals reviewed.    ED Treatments / Results  Labs (all labs ordered are listed, but only abnormal results are displayed) Labs Reviewed  COMPREHENSIVE METABOLIC PANEL - Abnormal; Notable for the following components:      Result Value   Calcium 8.8 (*)    All other components within normal limits  CBC WITH DIFFERENTIAL/PLATELET    EKG None  ED ECG REPORT   Date: 07/28/2018  Rate: 67  Rhythm: normal sinus rhythm  QRS  Axis: normal  Intervals: normal  ST/T Wave abnormalities: normal  Conduction Disutrbances:none  Narrative Interpretation:   Old EKG Reviewed: none available  I have personally reviewed the EKG tracing and agree with the computerized printout as noted.   Radiology No results found.  Procedures Procedures (including critical care time)  Medications Ordered in ED Medications - No data to display   Initial Impression / Assessment and Plan / ED Course  I have reviewed the triage vital signs and the nursing notes.  Pertinent labs & imaging results that were available during my care of the patient were reviewed by me and considered in my medical decision making (see chart for details).     Sherry Bell is a 72 y.o. female with a past medical history significant for thyroid disease, fibroids, hypertension, and recent dental procedures who presents with headache and elevated blood pressure.  Patient reports that she is having temporal headache for the last few days.  She reports no nausea or vomiting.  She thinks her blood pressure has been elevated at home with blood pressures into the 170s and 180s.  She says that she has not made any recent medication changes.  She has not had any vision changes, nausea, vomiting, or trauma.  She  reports that she had some headaches after having a right upper dental procedure performed and thinks that her TMJ may be irritated contributing to headaches.  She denies any neck pain, neck stiffness, or neurologic complaints.  No chest pain, shortness breath or palpitations.  No urinary symptoms or GI symptoms.  No other complaints.    On exam, patient's blood pressure was initially in the 811B and 147W systolic.  Patient's lungs were clear and chest was nontender.  Abdomen was nontender.  No focal neurologic deficits were seen and patient was ambulated.  Normal finger-nose finger testing and normal neck range of motion.  Patient's mouth showed no evidence of PTA or RPA and no evidence of dental abscess seen.  No significant tenderness on the upper jaw at this time.  Based on description of symptoms I am concerned that the patient's TMJ irritation from her dental procedures contribute to headache and thus contributing to her elevated blood pressure.  While she has been in the ED, her headache completely resolved and her blood pressure improved into the 140s and 150s.  I do not suspect she is in hypertensive emergency and given the resolution of headache with a normal neuro exam, have very low suspicion for an intracranial hemorrhage or other abnormality that would require intervention at this time.  Patient had screening blood work to look for worsened R imbalance or AKI that could contribute to her elevated blood pressure.  Labs reassuring.    Patient was offered urinalysis and chest x-ray to look for other occult infections and she did not want to do them.  She did not want any medications for headache.  I do not feel she needs head CT or other intervention at this time.    Patient's EKG was unremarkable and reassuring.    Given patient's stable and normal neuro exam, her resolution of headache, and her blood pressure that has improved into the 295 systolic, patient was felt stable for discharge home.   Patient will follow with her dentist as I suspect this is contributing to symptoms.  She will also follow-up with her PCP to discuss further blood pressure management.  Patient's left arm had bruising from her IV stick and I re-bandaged it  myself.   Patient will follow-up as we discussed and understood return precautions.  Patient was discharged in good condition with resolution of presenting symptoms.   Final Clinical Impressions(s) / ED Diagnoses   Final diagnoses:  Acute nonintractable headache, unspecified headache type  Hypertension, unspecified type    ED Discharge Orders    None      Clinical Impression: 1. Acute nonintractable headache, unspecified headache type   2. Hypertension, unspecified type     Disposition: Discharge  Condition: Good  I have discussed the results, Dx and Tx plan with the pt(& family if present). He/she/they expressed understanding and agree(s) with the plan. Discharge instructions discussed at great length. Strict return precautions discussed and pt &/or family have verbalized understanding of the instructions. No further questions at time of discharge.    New Prescriptions   No medications on file    Follow Up: [facility] [address] [id number] T[place] [facility] [place]    your PCP        Jannely Henthorn, Gwenyth Allegra, MD 07/28/18 [phone]

## 2018-07-27 NOTE — ED Notes (Signed)
Pt understood dc material. NAD noted. 

## 2018-08-20 ENCOUNTER — Other Ambulatory Visit: Payer: Self-pay | Admitting: Endocrinology

## 2018-09-22 DIAGNOSIS — H903 Sensorineural hearing loss, bilateral: Secondary | ICD-10-CM | POA: Diagnosis not present

## 2018-09-29 ENCOUNTER — Other Ambulatory Visit: Payer: PPO

## 2018-09-30 ENCOUNTER — Other Ambulatory Visit (INDEPENDENT_AMBULATORY_CARE_PROVIDER_SITE_OTHER): Payer: PPO

## 2018-09-30 DIAGNOSIS — E038 Other specified hypothyroidism: Secondary | ICD-10-CM

## 2018-09-30 LAB — T4, FREE: Free T4: 0.73 ng/dL (ref 0.60–1.60)

## 2018-09-30 LAB — TSH: TSH: 2.81 u[IU]/mL (ref 0.35–4.50)

## 2018-10-06 ENCOUNTER — Encounter: Payer: Self-pay | Admitting: Endocrinology

## 2018-10-06 ENCOUNTER — Ambulatory Visit: Payer: PPO | Admitting: Endocrinology

## 2018-10-06 VITALS — BP 128/80 | HR 88 | Ht 63.0 in | Wt 243.0 lb

## 2018-10-06 DIAGNOSIS — E038 Other specified hypothyroidism: Secondary | ICD-10-CM

## 2018-10-06 MED ORDER — LIOTHYRONINE SODIUM 5 MCG PO TABS: 10.0000 ug | ORAL_TABLET | Freq: Every day | ORAL | 1 refills | Status: DC

## 2018-10-06 MED ORDER — LEVOTHYROXINE SODIUM 112 MCG PO TABS: 112.0000 ug | ORAL_TABLET | Freq: Every day | ORAL | 3 refills | Status: DC

## 2018-10-06 NOTE — Progress Notes (Signed)
Patient ID: Sherry Bell, female   DOB: [birth year], 72 y.o.   MRN: [id number]           Reason for Appointment: Follow-up of hypothyroidism and of thyroid cyst    History of Present Illness:    HYPOTHYROIDISM:  This was diagnosed probably in the 1990s when the patient had gone to her physician because of weight gain. She does not think she had any unusual fatigue at that time She was found to be hypothyroid She does not think she felt any different with taking the thyroid supplements but her weight stabilized.  Her previous endocrinologist put her on combination of Synthroid and Cytomel 25 g, 1/2 tablet, this was prescribed twice a day  Subsequently she had been taking the Cytomel only in the morning since she would forget the evening dose  She continues to be on Synthroid 100 g and Cytomel 12.5 g in the morning No unusual fatigue but she has gained some weight She has been regular with taking her supplement in the morning before breakfast However she thinks that with a pill cutter if she is not able to break the Cytomel properly and it splinters  Wt Readings from Last 3 Encounters:  10/06/18 243 lb (110.2 kg)  07/27/18 230 lb (104.3 kg)  03/31/18 230 lb (104.3 kg)    TSH is again normal and free T4 about the same     THYROID cyst:  The patient's thyroid nodule was first discovered in early 2016 soon after she had a respiratory infection in 11/2014 She had felt a swelling on the left side of her neck and was seen by her PCP for evaluation in 3/16 She did not have any swelling prior to that.  She does not feel that her neck swelling has increased in size and it may have moved more towards the center of the neck  The thyroid ultrasound showed the following Dominant simple appearing cyst occupies much of the left lobe and measures approximately 5.2 x 5.4 x 4.6 cm.  This cyst does not have any complex features or mural nodularity. On review of her previous records she had  a predominantly cystic lesion in the upper pole of her thyroid measuring 1.7 cm in 2003 This was biopsied in 2002 and was benign Also she had a subcentimeter cyst in the lower left pole of the left side in 2003  RECENT history: She had a recurrence of the cyst in 2017 with local pressure symptoms Aspiration of this cyst was successfully done in June 2017 by Dr. Loanne Drilling and about 100 mL of fluid removed which was negative for cytology  She does not feel any swelling in her neck now   Lab Results  Component Value Date   FREET4 0.73 09/30/2018   FREET4 0.71 03/24/2018   FREET4 0.78 09/17/2017   TSH 2.81 09/30/2018   TSH 2.62 03/24/2018   TSH 3.51 09/17/2017   Lab Results  Component Value Date   T3FREE 3.3 03/24/2018   T3FREE 3.0 09/17/2017   T3FREE 3.4 02/12/2017    Allergies as of 10/06/2018      Reactions   Amoxicillin Nausea And Vomiting   Unknown   Pneumococcal 13-val Conj Vacc Other (See Comments)   Codeine Nausea And Vomiting, Rash, Nausea Only   Tetanus-diphth-acell Pertussis Palpitations      Medication List        Accurate as of 10/06/18 10:41 AM. Always use your most recent med list.  cholecalciferol 1000 units tablet Commonly known as:  VITAMIN D Take 7,000 Units by mouth daily.   hydrocortisone-pramoxine 2.5-1 % rectal cream Commonly known as:  ANALPRAM-HC Place 1 application rectally 3 (three) times daily.   liothyronine 25 MCG tablet Commonly known as:  CYTOMEL Take 0.5 tablets (12.5 mcg total) by mouth daily.   Magnesium 300 MG Caps Take 1 capsule by mouth daily. Reported on 02/14/2016   meloxicam 15 MG tablet Commonly known as:  MOBIC meloxicam 15 mg tablet  TAKE 1 TABLET BY MOUTH EVERY DAY   ranitidine 150 MG tablet Commonly known as:  ZANTAC Take 1 tablet (150 mg total) by mouth 2 (two) times daily.   spironolactone 25 MG tablet Commonly known as:  ALDACTONE Take 25 mg by mouth daily.   SYNTHROID 100 MCG tablet Generic  drug:  levothyroxine TAKE 1 TABLET BY MOUTH EVERY DAY       Allergies:  Allergies  Allergen Reactions  . Amoxicillin Nausea And Vomiting    Unknown  . Pneumococcal 13-Val Conj Vacc Other (See Comments)  . Codeine Nausea And Vomiting, Rash and Nausea Only  . Tetanus-Diphth-Acell Pertussis Palpitations    Past Medical History:  Diagnosis Date  . Allergy   . Anxiety   . Arthritis   . Cataract   . Cystocele   . Fibroids   . Gastric reflux   . Generalized headaches   . H. pylori infection    8 years ago  . Heart murmur    Mild mitral regurgitation- pt denies this 07-02-16 at her [facility]  . Hiatal hernia   . Hypertension   . Obstructive sleep apnea    uses CPAP  . Pain in limb   . Sleep apnea    uses CPAP  . Thyroid disease   . Tubular adenoma of colon   . Varicose veins     There is no history of radiation to the neck in childhood  Past Surgical History:  Procedure Laterality Date  . BREATH TEK H PYLORI N/A 05/14/2016   Procedure: BREATH TEK H PYLORI;  Surgeon: Jerene Bears, MD;  Location: [facility];  Service: Gastroenterology;  Laterality: N/A;  . COLONOSCOPY     Dr Lajoyce Corners  . ESOPHAGOGASTRODUODENOSCOPY     Dr Lajoyce Corners  . TONSILECTOMY, ADENOIDECTOMY, [place]    . UPPER GASTROINTESTINAL ENDOSCOPY      Family History  Problem Relation Age of Onset  . Throat cancer Father        worked at [employer]  . Hypothyroidism Sister   . Kidney disease Mother   . Colon cancer Neg Hx   . Esophageal cancer Neg Hx   . Rectal cancer Neg Hx   . Stomach cancer Neg Hx   . Colon polyps Neg Hx     Social History:  reports that she has never smoked. She has never used smokeless tobacco. She reports that she does not drink alcohol or use drugs.   Review of Systems:      She has had sleep apnea diagnosed in 2013 using CPAP, using this regularly         Examination:   BP 128/80   Pulse 88   Ht 5\' 3"  (1.6 m)   Wt 243 lb (110.2 kg)   SpO2 98%   BMI 43.05  kg/m        Thyroid is not enlarged on either side  Biceps reflexes are normal   Assessment/Plan:  History of thyroid  cyst on the left side which completely resolved with aspiration in June 2017 and has not recurred We will continue to follow with periodic exams  HYPOTHYROIDISM:   Subjectively doing well Exam: She looks euthyroid Thyroid levels are again normal She reports difficulty breaking her Cytomel in half and will change her to 10 mcg instead of 12.5 in the morning We will adjust her Synthroid by 12 mcg to compensate for lower T3 dosage  Dyspnea on exertion: Advised her to follow-up with her cardiologist  Follow-up in 6 months  Janya Eveland Dwyane Dee 10/06/2018

## 2018-12-02 DIAGNOSIS — H903 Sensorineural hearing loss, bilateral: Secondary | ICD-10-CM | POA: Diagnosis not present

## 2019-01-08 DIAGNOSIS — G4733 Obstructive sleep apnea (adult) (pediatric): Secondary | ICD-10-CM | POA: Diagnosis not present

## 2019-03-30 ENCOUNTER — Other Ambulatory Visit: Payer: Self-pay | Admitting: Endocrinology

## 2019-04-06 ENCOUNTER — Other Ambulatory Visit: Payer: PPO

## 2019-04-08 ENCOUNTER — Ambulatory Visit: Payer: PPO | Admitting: Endocrinology

## 2019-04-25 ENCOUNTER — Other Ambulatory Visit: Payer: Self-pay | Admitting: Endocrinology

## 2019-05-13 ENCOUNTER — Other Ambulatory Visit: Payer: Self-pay

## 2019-05-13 ENCOUNTER — Telehealth: Payer: Self-pay | Admitting: Endocrinology

## 2019-05-13 ENCOUNTER — Other Ambulatory Visit: Payer: Self-pay | Admitting: Endocrinology

## 2019-05-13 MED ORDER — LIOTHYRONINE SODIUM 5 MCG PO TABS: 10.0000 ug | ORAL_TABLET | Freq: Every day | ORAL | 0 refills | Status: DC

## 2019-05-13 NOTE — Telephone Encounter (Signed)
Rx sent 

## 2019-05-13 NOTE — Telephone Encounter (Signed)
Patient has lab appointment 05/19/19 and follow up appointment 05/25/2019  Would like her outstanding RX filled if possible

## 2019-05-19 ENCOUNTER — Other Ambulatory Visit: Payer: Self-pay

## 2019-05-19 ENCOUNTER — Other Ambulatory Visit (INDEPENDENT_AMBULATORY_CARE_PROVIDER_SITE_OTHER): Payer: PPO

## 2019-05-19 DIAGNOSIS — E038 Other specified hypothyroidism: Secondary | ICD-10-CM | POA: Diagnosis not present

## 2019-05-19 LAB — TSH: TSH: 2.32 u[IU]/mL (ref 0.35–4.50)

## 2019-05-19 LAB — T4, FREE: Free T4: 0.91 ng/dL (ref 0.60–1.60)

## 2019-05-19 LAB — T3, FREE: T3, Free: 2.8 pg/mL (ref 2.3–4.2)

## 2019-05-25 ENCOUNTER — Other Ambulatory Visit: Payer: Self-pay

## 2019-05-25 ENCOUNTER — Ambulatory Visit (INDEPENDENT_AMBULATORY_CARE_PROVIDER_SITE_OTHER): Payer: PPO | Admitting: Endocrinology

## 2019-05-25 DIAGNOSIS — E038 Other specified hypothyroidism: Secondary | ICD-10-CM

## 2019-05-25 MED ORDER — LIOTHYRONINE SODIUM 5 MCG PO TABS: 10.0000 ug | ORAL_TABLET | Freq: Every day | ORAL | 1 refills | Status: DC

## 2019-05-25 NOTE — Progress Notes (Addendum)
Patient ID: Sherry Bell, female   DOB: 08-05-1946, 73 y.o.   MRN: [id number]           Reason for Appointment: Follow-up of hypothyroidism and of thyroid cyst  Today's office visit was provided via telemedicine using a telephone call to the patient Patient has been explained the limitations of evaluation and management by telemedicine and the availability of in person appointments.  The patient understood the limitations and agreed to proceed. Patient also understood that the telehealth visit is billable. . Location of the patient: Home . Location of the provider: Office Only the patient and myself were participating in the encounter   History of Present Illness:    HYPOTHYROIDISM:  This was diagnosed probably in the 1990s when the patient had gone to her physician because of weight gain. She does not think she had any unusual fatigue at that time She was found to be hypothyroid She does not think she felt any different with taking the thyroid supplements but her weight stabilized.  Her previous endocrinologist put her on combination of Synthroid and Cytomel 25 g, 1/2 tablet, this was prescribed twice a day  Subsequently she had been taking the Cytomel only in the morning since she would forget the evening dose  Recent history: She was last seen in 11/19 At that time she was concerned that she was having tiredness and sleepiness Also had difficulty cutting the Liothyronine 25 Mcg tablets in half and it would splinter  Although her thyroid levels were normal her liothyronine was switched to 10 mcg instead of 12.5 Also levothyroxine was increased from 100 up to 112 mcg  She also is that she does feel less tired and sleepy after the changes were made She has lost 17 pounds but she thinks this is from following a [employer] that she got from someone  She is very regular with taking her supplement before eating in the morning  She is unclear whether she was on her  medications at the time of her lab work as she has been unable to get liothyronine refill for about a week or so   Wt Readings from Last 3 Encounters:  10/06/18 243 lb (110.2 kg)  07/27/18 230 lb (104.3 kg)  03/31/18 230 lb (104.3 kg)    Labs show normal thyroid levels although T3 slightly lower and free T4 relatively higher   Lab Results  Component Value Date   FREET4 0.91 05/19/2019   FREET4 0.73 09/30/2018   FREET4 0.71 03/24/2018   TSH 2.32 05/19/2019   TSH 2.81 09/30/2018   TSH 2.62 03/24/2018   Lab Results  Component Value Date   T3FREE 2.8 05/19/2019   T3FREE 3.3 03/24/2018   T3FREE 3.0 09/17/2017    THYROID cyst:  The patient's thyroid nodule was first discovered in early 2016 soon after she had a respiratory infection in 11/2014 She had felt a swelling on the left side of her neck and was seen by her PCP for evaluation in 3/16 She did not have any swelling prior to that.  She does not feel that her neck swelling has increased in size and it may have moved more towards the center of the neck  The thyroid ultrasound showed the following Dominant simple appearing cyst occupies much of the left lobe and measures approximately 5.2 x 5.4 x 4.6 cm.  This cyst does not have any complex features or mural nodularity. On review of her previous records she had a predominantly cystic  lesion in the upper pole of her thyroid measuring 1.7 cm in 2003 This was biopsied in 2002 and was benign Also she had a subcentimeter cyst in the lower left pole of the left side in 2003  RECENT history: She had a recurrence of the cyst in 2017 with local pressure symptoms Aspiration of this cyst was successfully done in June 2017 by Dr. Loanne Drilling and about 100 mL of fluid removed which was negative for cytology  She has not felt any swelling in her neck recently   Allergies as of 05/25/2019      Reactions   Amoxicillin Nausea And Vomiting   Unknown   Pneumococcal 13-val Conj Vacc Other (See  Comments)   Codeine Nausea And Vomiting, Rash, Nausea Only   Tetanus-diphth-acell Pertussis Palpitations      Medication List       Accurate as of May 25, 2019  3:31 PM. If you have any questions, ask your nurse or doctor.        cholecalciferol 1000 units tablet Commonly known as: VITAMIN D Take 7,000 Units by mouth daily.   hydrocortisone-pramoxine 2.5-1 % rectal cream Commonly known as: Analpram HC Place 1 application rectally 3 (three) times daily.   levothyroxine 112 MCG tablet Commonly known as: SYNTHROID Take 1 tablet (112 mcg total) by mouth daily.   liothyronine 5 MCG tablet Commonly known as: CYTOMEL Take 2 tablets (10 mcg total) by mouth daily.   Magnesium 300 MG Caps Take 1 capsule by mouth daily. Reported on 02/14/2016   meloxicam 15 MG tablet Commonly known as: MOBIC meloxicam 15 mg tablet  TAKE 1 TABLET BY MOUTH EVERY DAY   ranitidine 150 MG tablet Commonly known as: Zantac Take 1 tablet (150 mg total) by mouth 2 (two) times daily.   spironolactone 25 MG tablet Commonly known as: ALDACTONE Take 25 mg by mouth daily.       Allergies:  Allergies  Allergen Reactions  . Amoxicillin Nausea And Vomiting    Unknown  . Pneumococcal 13-Val Conj Vacc Other (See Comments)  . Codeine Nausea And Vomiting, Rash and Nausea Only  . Tetanus-Diphth-Acell Pertussis Palpitations    Past Medical History:  Diagnosis Date  . Allergy   . Anxiety   . Arthritis   . Cataract   . Cystocele   . Fibroids   . Gastric reflux   . Generalized headaches   . H. pylori infection    8 years ago  . Heart murmur    Mild mitral regurgitation- pt denies this 07-02-16 at [facility]  . Hiatal hernia   . Hypertension   . Obstructive sleep apnea    uses CPAP  . Pain in limb   . Sleep apnea    uses CPAP  . Thyroid disease   . Tubular adenoma of colon   . Varicose veins     There is no history of radiation to the neck in childhood  Past Surgical History:  Procedure  Laterality Date  . BREATH TEK H PYLORI N/A 05/14/2016   Procedure: BREATH TEK H PYLORI;  Surgeon: Jerene Bears, MD;  Location: [facility];  Service: Gastroenterology;  Laterality: N/A;  . COLONOSCOPY     Dr Lajoyce Corners  . ESOPHAGOGASTRODUODENOSCOPY     Dr Lajoyce Corners  . TONSILECTOMY, ADENOIDECTOMY, [place]    . UPPER GASTROINTESTINAL ENDOSCOPY      Family History  Problem Relation Age of Onset  . Throat cancer Father  worked at [employer]  . Hypothyroidism Sister   . Kidney disease Mother   . Colon cancer Neg Hx   . Esophageal cancer Neg Hx   . Rectal cancer Neg Hx   . Stomach cancer Neg Hx   . Colon polyps Neg Hx     Social History:  reports that she has never smoked. She has never used smokeless tobacco. She reports that she does not drink alcohol or use drugs.   Review of Systems:      She has had sleep apnea diagnosed in 2013 using CPAP, using this regularly         Examination:   There were no vitals taken for this visit.       Thyroid is not enlarged on either side  Biceps reflexes are normal   Assessment/Plan:  HYPOTHYROIDISM:   She is on liothyronine and levothyroxine With taking 10 mcg of liothyronine and 112 of levothyroxine she is subjectively doing fairly well now Also likely feels better with losing 17 pounds since her last visit She is compliant with her supplements Likely is benefiting from using 5 mcg tablets instead of cutting the 25 tablets in half as before She will continue the same dose   History of thyroid cyst on the left side:  This had completely resolved with aspiration in June 2017 She is not recently not noticing any swelling coming back  We will continue to follow her clinically  Follow-up in 6 months  Elayne Snare 05/25/2019   Duration of phone call =7 minutes

## 2019-06-03 DIAGNOSIS — G4733 Obstructive sleep apnea (adult) (pediatric): Secondary | ICD-10-CM | POA: Diagnosis not present

## 2019-07-08 DIAGNOSIS — M545 Low back pain: Secondary | ICD-10-CM | POA: Diagnosis not present

## 2019-07-08 DIAGNOSIS — M25561 Pain in right knee: Secondary | ICD-10-CM | POA: Diagnosis not present

## 2019-07-22 DIAGNOSIS — G4730 Sleep apnea, unspecified: Secondary | ICD-10-CM | POA: Diagnosis not present

## 2019-07-22 DIAGNOSIS — R7301 Impaired fasting glucose: Secondary | ICD-10-CM | POA: Diagnosis not present

## 2019-07-22 DIAGNOSIS — I517 Cardiomegaly: Secondary | ICD-10-CM | POA: Diagnosis not present

## 2019-07-22 DIAGNOSIS — I10 Essential (primary) hypertension: Secondary | ICD-10-CM | POA: Diagnosis not present

## 2019-07-22 DIAGNOSIS — K219 Gastro-esophageal reflux disease without esophagitis: Secondary | ICD-10-CM | POA: Diagnosis not present

## 2019-07-22 DIAGNOSIS — Z6841 Body Mass Index (BMI) 40.0 and over, adult: Secondary | ICD-10-CM | POA: Diagnosis not present

## 2019-07-22 DIAGNOSIS — E039 Hypothyroidism, unspecified: Secondary | ICD-10-CM | POA: Diagnosis not present

## 2019-07-22 DIAGNOSIS — Z86010 Personal history of colonic polyps: Secondary | ICD-10-CM | POA: Diagnosis not present

## 2019-07-22 DIAGNOSIS — E7800 Pure hypercholesterolemia, unspecified: Secondary | ICD-10-CM | POA: Diagnosis not present

## 2019-07-28 DIAGNOSIS — K219 Gastro-esophageal reflux disease without esophagitis: Secondary | ICD-10-CM | POA: Diagnosis not present

## 2019-07-28 DIAGNOSIS — E7800 Pure hypercholesterolemia, unspecified: Secondary | ICD-10-CM | POA: Diagnosis not present

## 2019-07-28 DIAGNOSIS — G4730 Sleep apnea, unspecified: Secondary | ICD-10-CM | POA: Diagnosis not present

## 2019-07-28 DIAGNOSIS — I517 Cardiomegaly: Secondary | ICD-10-CM | POA: Diagnosis not present

## 2019-07-28 DIAGNOSIS — I10 Essential (primary) hypertension: Secondary | ICD-10-CM | POA: Diagnosis not present

## 2019-07-28 DIAGNOSIS — R7301 Impaired fasting glucose: Secondary | ICD-10-CM | POA: Diagnosis not present

## 2019-07-28 DIAGNOSIS — E039 Hypothyroidism, unspecified: Secondary | ICD-10-CM | POA: Diagnosis not present

## 2019-08-10 DIAGNOSIS — M170 Bilateral primary osteoarthritis of knee: Secondary | ICD-10-CM | POA: Diagnosis not present

## 2019-08-11 DIAGNOSIS — G4733 Obstructive sleep apnea (adult) (pediatric): Secondary | ICD-10-CM | POA: Diagnosis not present

## 2019-08-17 DIAGNOSIS — M545 Low back pain: Secondary | ICD-10-CM | POA: Diagnosis not present

## 2019-08-17 DIAGNOSIS — Z6841 Body Mass Index (BMI) 40.0 and over, adult: Secondary | ICD-10-CM | POA: Diagnosis not present

## 2019-08-17 DIAGNOSIS — F419 Anxiety disorder, unspecified: Secondary | ICD-10-CM | POA: Diagnosis not present

## 2019-08-19 ENCOUNTER — Other Ambulatory Visit: Payer: Self-pay | Admitting: Endocrinology

## 2019-08-31 ENCOUNTER — Other Ambulatory Visit: Payer: Self-pay

## 2019-08-31 ENCOUNTER — Encounter: Payer: Self-pay | Admitting: Cardiology

## 2019-08-31 ENCOUNTER — Ambulatory Visit (INDEPENDENT_AMBULATORY_CARE_PROVIDER_SITE_OTHER): Payer: PPO | Admitting: Cardiology

## 2019-08-31 VITALS — BP 127/66 | HR 73 | Temp 97.5°F | Ht 63.0 in | Wt 239.2 lb

## 2019-08-31 DIAGNOSIS — E66813 Obesity, class 3: Secondary | ICD-10-CM

## 2019-08-31 DIAGNOSIS — I10 Essential (primary) hypertension: Secondary | ICD-10-CM | POA: Diagnosis not present

## 2019-08-31 DIAGNOSIS — R0600 Dyspnea, unspecified: Secondary | ICD-10-CM

## 2019-08-31 DIAGNOSIS — E7800 Pure hypercholesterolemia, unspecified: Secondary | ICD-10-CM

## 2019-08-31 DIAGNOSIS — Z6841 Body Mass Index (BMI) 40.0 and over, adult: Secondary | ICD-10-CM | POA: Diagnosis not present

## 2019-08-31 DIAGNOSIS — R739 Hyperglycemia, unspecified: Secondary | ICD-10-CM | POA: Diagnosis not present

## 2019-08-31 DIAGNOSIS — R0609 Other forms of dyspnea: Secondary | ICD-10-CM

## 2019-08-31 MED ORDER — SIMVASTATIN 40 MG PO TABS: 20.0000 mg | ORAL_TABLET | Freq: Every day | ORAL | 2 refills | Status: DC

## 2019-08-31 NOTE — Progress Notes (Signed)
Primary Physician/Referring:  Hulan Fess, MD  Patient ID: Sherry Bell, female    DOB: 12/26/1945, 73 y.o.   MRN: [id number]  Chief Complaint  Patient presents with  . LVH  . Follow-up   HPI:    Sherry Bell  is a 73 y.o. Caucasian female patient with history of HTN, very mild HLD with elevated LDL particle number, obesity, and pre-diabetes. Patient also has sleep apnea and has been using CPAP. She is here on a annual office visit and follow-up of hypertension.  Except for degenerative joint disease and bilateral knee pain, chronic mild dyspnea, no specific complaints today.  Past Medical History:  Diagnosis Date  . Allergy   . Anxiety   . Arthritis   . Cataract   . Cystocele   . Fibroids   . Gastric reflux   . Generalized headaches   . H. pylori infection    8 years ago  . Heart murmur    Mild mitral regurgitation- pt denies this 07-02-16 at her [facility]  . Hiatal hernia   . Hypertension   . Obstructive sleep apnea    uses CPAP  . Pain in limb   . Sleep apnea    uses CPAP  . Thyroid disease   . Tubular adenoma of colon   . Varicose veins    Past Surgical History:  Procedure Laterality Date  . BREATH TEK H PYLORI N/A 05/14/2016   Procedure: BREATH TEK H PYLORI;  Surgeon: Jerene Bears, MD;  Location: [facility];  Service: Gastroenterology;  Laterality: N/A;  . COLONOSCOPY     Dr Lajoyce Corners  . ESOPHAGOGASTRODUODENOSCOPY     Dr Lajoyce Corners  . TONSILECTOMY, ADENOIDECTOMY, [place]    . UPPER GASTROINTESTINAL ENDOSCOPY     Social History   Socioeconomic History  . Marital status: Single    Spouse name: Not on file  . Number of children: 1  . Years of education: Not on file  . Highest education level: Not on file  Occupational History  . Occupation: hairdresser  Social Needs  . Financial resource strain: Not on file  . Food insecurity    Worry: Not on file    Inability: Not on file  . Transportation needs    Medical: Not on file   Non-medical: Not on file  Tobacco Use  . Smoking status: Never Smoker  . Smokeless tobacco: Never Used  Substance and Sexual Activity  . Alcohol use: No  . Drug use: No  . Sexual activity: Never  Lifestyle  . Physical activity    Days per week: Not on file    Minutes per session: Not on file  . Stress: Not on file  Relationships  . Social Herbalist on phone: Not on file    Gets together: Not on file    Attends religious service: Not on file    Active member of club or organization: Not on file    Attends meetings of clubs or organizations: Not on file    Relationship status: Not on file  . Intimate partner violence    Fear of current or ex partner: Not on file    Emotionally abused: Not on file    Physically abused: Not on file    Forced sexual activity: Not on file  Other Topics Concern  . Not on file  Social History Narrative  . Not on file   ROS  Review of Systems  Constitution: Positive for  malaise/fatigue. Negative for chills, decreased appetite and weight gain.  Cardiovascular: Positive for dyspnea on exertion. Negative for leg swelling and syncope.  Respiratory: Positive for snoring.   Endocrine: Negative for cold intolerance.  Hematologic/Lymphatic: Does not bruise/bleed easily.  Musculoskeletal: Positive for arthritis, back pain and joint pain. Negative for joint swelling.  Gastrointestinal: Negative for abdominal pain, anorexia, change in bowel habit, hematochezia and melena.  Neurological: Negative for headaches and light-headedness.  Psychiatric/Behavioral: Negative for depression and substance abuse.  All other systems reviewed and are negative.  Objective   Vitals with BMI 08/31/2019 10/06/2018 07/27/2018  Height '5\' 3"'  '5\' 3"'  -  Weight 239 lbs 3 oz 243 lbs -  BMI 86.38 17.71 -  Systolic 165 790 383  Diastolic 66 80 72  Pulse 73 88 72    Blood pressure (!) 142/66, pulse 73, temperature (!) 97.5 F (36.4 C), height '5\' 3"'  (1.6 m), weight 239 lb  3.2 oz (108.5 kg), SpO2 95 %. Body mass index is 42.37 kg/m.   Physical Exam  Constitutional: She appears well-developed. No distress.  Morbidly obese  HENT:  Head: Atraumatic.  Eyes: Conjunctivae are normal.  Neck: Neck supple. No thyromegaly present.  Short neck and difficult to evaluate JVP  Cardiovascular: Normal rate, regular rhythm and normal heart sounds. Exam reveals no gallop.  No murmur heard. Pulses:      Carotid pulses are 2+ on the right side and 2+ on the left side.      Dorsalis pedis pulses are 2+ on the right side and 2+ on the left side.       Posterior tibial pulses are 2+ on the right side and 2+ on the left side.  Femoral and popliteal pulse difficult to feel due to patient's body habitus.   Bilateral varicose veins noted. No edema. No JVD.   Pulmonary/Chest: Effort normal and breath sounds normal.  Abdominal: Soft. Bowel sounds are normal.  Obese. Pannus present  Musculoskeletal: Normal range of motion.        General: No edema.  Neurological: She is alert.  Skin: Skin is warm and dry.  Psychiatric: She has a normal mood and affect.   Radiology: No results found.  Laboratory examination:   Labs 07/28/2019: Total cholesterol 216, triglycerides 103, HDL 54, LDL 142.  Non-HDL cholesterol 163.  Serum glucose 110 mg, frequency 5.8%, BUN 22, creatinine 0.87, eGFR greater than 60 him up.  Potassium 5.2.  CMP otherwise normal.  CBC normal  No results for input(s): NA, K, CL, CO2, GLUCOSE, BUN, CREATININE, CALCIUM, GFRNONAA, GFRAA in the last 8760 hours. CMP Latest Ref Rng & Units 07/27/2018 06/22/2017 04/17/2016  Glucose 70 - 99 mg/dL 91 151(H) 162(H)  BUN 8 - 23 mg/dL '13 11 16  ' Creatinine 0.44 - 1.00 mg/dL 0.76 0.90 0.91  Sodium 135 - 145 mmol/L 140 134(L) 136  Potassium 3.5 - 5.1 mmol/L 4.4 4.0 4.5  Chloride 98 - 111 mmol/L 103 100(L) 103  CO2 22 - 32 mmol/L '27 25 25  ' Calcium 8.9 - 10.3 mg/dL 8.8(L) 8.8(L) 9.1  Total Protein 6.5 - 8.1 g/dL 7.9 7.3 7.6  Total  Bilirubin 0.3 - 1.2 mg/dL 0.9 1.5(H) 0.8  Alkaline Phos 38 - 126 U/L 45 43 49  AST 15 - 41 U/L '24 21 20  ' ALT 0 - 44 U/L '21 20 20   ' CBC Latest Ref Rng & Units 07/27/2018 06/22/2017 04/17/2016  WBC 4.0 - 10.5 K/uL 7.1 23.3(H) 13.5(H)  Hemoglobin 12.0 - 15.0  g/dL 13.0 12.1 13.2  Hematocrit 36.0 - 46.0 % 39.5 36.6 38.6  Platelets 150 - 400 K/uL 193 182 199   Lipid Panel  No results found for: CHOL, TRIG, HDL, CHOLHDL, VLDL, LDLCALC, LDLDIRECT HEMOGLOBIN A1C No results found for: HGBA1C, MPG TSH Recent Labs    09/30/18 0845 05/19/19 1027  TSH 2.81 2.32   Medications and allergies   Allergies  Allergen Reactions  . Amoxicillin Nausea And Vomiting    Unknown  . Pneumococcal 13-Val Conj Vacc Other (See Comments)  . Codeine Nausea And Vomiting, Rash and Nausea Only  . Tetanus-Diphth-Acell Pertussis Palpitations     Prior to Admission medications   Medication Sig Start Date End Date Taking? Authorizing Provider  cholecalciferol (VITAMIN D) 1000 UNITS tablet Take 7,000 Units by mouth daily.     [provider]  hydrocortisone-pramoxine (ANALPRAM HC) 2.5-1 % rectal cream Place 1 application rectally 3 (three) times daily. 02/04/18   Pyrtle, Lajuan Lines, MD  liothyronine (CYTOMEL) 5 MCG tablet Take 2 tablets (10 mcg total) by mouth daily. 05/25/19   Elayne Snare, MD  Magnesium 300 MG CAPS Take 1 capsule by mouth daily. Reported on 02/14/2016    [provider]  meloxicam (MOBIC) 15 MG tablet meloxicam 15 mg tablet  TAKE 1 TABLET BY MOUTH EVERY DAY    [provider]  ranitidine (ZANTAC) 150 MG tablet Take 1 tablet (150 mg total) by mouth 2 (two) times daily. 02/04/18   Pyrtle, Lajuan Lines, MD  spironolactone (ALDACTONE) 25 MG tablet Take 25 mg by mouth daily.    [provider]  SYNTHROID 112 MCG tablet TAKE 1 TABLET BY MOUTH EVERY DAY 08/19/19   Elayne Snare, MD     Current Outpatient Medications  Medication Instructions  . cholecalciferol (VITAMIN D) 7,000 Units,  Oral, Daily  . hydrocortisone-pramoxine (ANALPRAM HC) 2.5-1 % rectal cream 1 application, Rectal, 3 times daily  . liothyronine (CYTOMEL) 10 mcg, Oral, Daily  . Magnesium 300 MG CAPS 1 capsule, Oral, Daily, Reported on 02/14/2016  . meloxicam (MOBIC) 15 MG tablet meloxicam 15 mg tablet  TAKE 1 TABLET BY MOUTH EVERY DAY  . ranitidine (ZANTAC) 150 mg, Oral, 2 times daily  . spironolactone (ALDACTONE) 25 mg, Oral, Daily  . SYNTHROID 112 MCG tablet TAKE 1 TABLET BY MOUTH EVERY DAY    Cardiac Studies:   SLEEP STUDY 03/24/12 (POSITIVE) follows Dr. Maxwell Caul and on CPAP and compliant.  Treadmill exercise stress test 03/25/2017: Indication: SoB and HTN Resting EKG demonstrates NSR. The patient exercised according to Bruce Protocol, Total time recorded 5:29 min achieving max heart rate of 146 which was 96% of THR for age and 7.05 METS of work. Stress terminated due to dyspnea, fatigue and THR (>85% MPHR)/MPHR met. Normal BP response. There was no ST-T changes of ischemia with exercise stress test. There were no significant arrhythmias. Normal BP response. Rec: No e/o ischemia by GXT. Exercise tolerence is markedly reduced due to aerobic intolerence. Continue Preventive therapy.  Echocardiogram 04/09/2017: Left ventricle cavity is normal in size. Moderate concentric hypertrophy of the left ventricle. Normal global wall motion. Doppler evidence of grade II (pseudonormal) diastolic dysfunction, elevated LAP. Calculated EF 56%. Left atrial cavity is mildly dilated at 4.1 cm. Mild (Grade I) mitral regurgitation. Mild tricuspid regurgitation. No evidence of pulmonary hypertension. Compared to the study done on 6/17/23rd in, grade 2 diastolic dysfunction, MR and TR new.   Assessment     ICD-10-CM   1. Essential hypertension  I10  EKG 12-Lead  2. Dyspnea on exertion  R06.00 EKG 12-Lead  3. Class 3 severe obesity due to excess calories without serious comorbidity with body mass index (BMI) of 40.0 to 44.9  in adult (HCC)  E66.01    Z68.41   4. Hypercholesteremia  E78.00     EKG 08/31/2019: Normal sinus rhythm at rate of 72 bpm, normal axis.  No evidence of ischemia, normal EKG. No significant change from   EKG 12/17/2017  Recommendations:   Patient is here on annual visit and follow-up of hypertension and hyperlipidemia, her labs from PCP were reviewed.  She does have hyperglycemia.  Previously she has been reluctant on starting statins, she is willing to start statins, simvastatin 40 mg prescription was sent.  With regard to hypertension, blood pressure is very well controlled.  She is on spironolactone and she is tolerating this well.  I have discussed with her regarding high potassium diet and to avoid eating excessive amounts of fruits.  I'll repeat CMP along with lipid profile testing in 6 weeks.  Weight loss was again discussed with the patient.  Dyspnea is remained stable, there is no clinical evidence of congestive heart failure.  I'll see her back on an annual basis at patient's request.  Adrian Prows, MD, [facility] 08/31/2019, 10:37 AM [facility]. [place] Pager: [phone] Office: [phone] If no answer Cell [phone]

## 2019-09-02 DIAGNOSIS — E559 Vitamin D deficiency, unspecified: Secondary | ICD-10-CM | POA: Diagnosis not present

## 2019-09-07 DIAGNOSIS — M545 Low back pain: Secondary | ICD-10-CM | POA: Diagnosis not present

## 2019-09-07 DIAGNOSIS — M25562 Pain in left knee: Secondary | ICD-10-CM | POA: Diagnosis not present

## 2019-09-07 DIAGNOSIS — M25561 Pain in right knee: Secondary | ICD-10-CM | POA: Diagnosis not present

## 2019-09-09 DIAGNOSIS — G4733 Obstructive sleep apnea (adult) (pediatric): Secondary | ICD-10-CM | POA: Diagnosis not present

## 2019-09-17 DIAGNOSIS — M25561 Pain in right knee: Secondary | ICD-10-CM | POA: Diagnosis not present

## 2019-09-17 DIAGNOSIS — M25562 Pain in left knee: Secondary | ICD-10-CM | POA: Diagnosis not present

## 2019-09-21 DIAGNOSIS — M25562 Pain in left knee: Secondary | ICD-10-CM | POA: Diagnosis not present

## 2019-09-21 DIAGNOSIS — M25561 Pain in right knee: Secondary | ICD-10-CM | POA: Diagnosis not present

## 2019-09-21 DIAGNOSIS — M545 Low back pain: Secondary | ICD-10-CM | POA: Diagnosis not present

## 2019-09-28 DIAGNOSIS — M25561 Pain in right knee: Secondary | ICD-10-CM | POA: Diagnosis not present

## 2019-09-28 DIAGNOSIS — M545 Low back pain: Secondary | ICD-10-CM | POA: Diagnosis not present

## 2019-09-28 DIAGNOSIS — M25562 Pain in left knee: Secondary | ICD-10-CM | POA: Diagnosis not present

## 2019-10-06 DIAGNOSIS — H524 Presbyopia: Secondary | ICD-10-CM | POA: Diagnosis not present

## 2019-10-06 DIAGNOSIS — H10413 Chronic giant papillary conjunctivitis, bilateral: Secondary | ICD-10-CM | POA: Diagnosis not present

## 2019-10-06 DIAGNOSIS — H25813 Combined forms of age-related cataract, bilateral: Secondary | ICD-10-CM | POA: Diagnosis not present

## 2019-10-12 DIAGNOSIS — M545 Low back pain: Secondary | ICD-10-CM | POA: Diagnosis not present

## 2019-10-12 DIAGNOSIS — M25562 Pain in left knee: Secondary | ICD-10-CM | POA: Diagnosis not present

## 2019-10-12 DIAGNOSIS — M25561 Pain in right knee: Secondary | ICD-10-CM | POA: Diagnosis not present

## 2019-11-15 ENCOUNTER — Other Ambulatory Visit: Payer: Self-pay | Admitting: Endocrinology

## 2019-11-16 ENCOUNTER — Other Ambulatory Visit: Payer: PPO

## 2019-11-23 ENCOUNTER — Ambulatory Visit: Payer: PPO | Admitting: Endocrinology

## 2019-11-27 ENCOUNTER — Other Ambulatory Visit: Payer: Self-pay | Admitting: Cardiology

## 2019-11-27 DIAGNOSIS — E7800 Pure hypercholesterolemia, unspecified: Secondary | ICD-10-CM

## 2019-12-07 ENCOUNTER — Other Ambulatory Visit: Payer: Self-pay

## 2019-12-07 ENCOUNTER — Other Ambulatory Visit (INDEPENDENT_AMBULATORY_CARE_PROVIDER_SITE_OTHER): Payer: Medicare Other

## 2019-12-07 DIAGNOSIS — E038 Other specified hypothyroidism: Secondary | ICD-10-CM | POA: Diagnosis not present

## 2019-12-07 LAB — TSH: TSH: 7.67 u[IU]/mL — ABNORMAL HIGH (ref 0.35–4.50)

## 2019-12-07 LAB — T3, FREE: T3, Free: 2.7 pg/mL (ref 2.3–4.2)

## 2019-12-07 LAB — T4, FREE: Free T4: 0.81 ng/dL (ref 0.60–1.60)

## 2019-12-08 ENCOUNTER — Other Ambulatory Visit: Payer: Self-pay | Admitting: Cardiology

## 2019-12-08 DIAGNOSIS — I10 Essential (primary) hypertension: Secondary | ICD-10-CM

## 2019-12-09 ENCOUNTER — Other Ambulatory Visit: Payer: Self-pay | Admitting: Internal Medicine

## 2019-12-11 ENCOUNTER — Other Ambulatory Visit: Payer: Self-pay | Admitting: Internal Medicine

## 2019-12-14 ENCOUNTER — Ambulatory Visit: Payer: PPO | Admitting: Endocrinology

## 2019-12-21 ENCOUNTER — Ambulatory Visit: Payer: PPO | Admitting: Endocrinology

## 2019-12-22 ENCOUNTER — Other Ambulatory Visit: Payer: Self-pay

## 2019-12-23 ENCOUNTER — Ambulatory Visit (INDEPENDENT_AMBULATORY_CARE_PROVIDER_SITE_OTHER): Payer: Medicare Other | Admitting: Endocrinology

## 2019-12-23 ENCOUNTER — Encounter: Payer: Self-pay | Admitting: Endocrinology

## 2019-12-23 VITALS — BP 146/70 | HR 88 | Ht 63.0 in | Wt 238.4 lb

## 2019-12-23 DIAGNOSIS — E038 Other specified hypothyroidism: Secondary | ICD-10-CM

## 2019-12-23 NOTE — Progress Notes (Signed)
Patient ID: MAEDELLE JUBRAN, female   DOB: [birth year], 74 y.o.   MRN: [id number]           Reason for Appointment: Follow-up of hypothyroidism and of thyroid cyst    History of Present Illness:    HYPOTHYROIDISM:  This was diagnosed probably in the 1990s when the patient had gone to her physician because of weight gain. She does not think she had any unusual fatigue at that time She was found to be hypothyroid She does not think she felt any different with taking the thyroid supplements but her weight stabilized.  Her previous endocrinologist put her on combination of Synthroid and Cytomel 25 g, 1/2 tablet, this was prescribed twice a day  Subsequently she had been taking the Cytomel only in the morning since she would forget the evening dose  Recent history:  Prior to her visit here she has been on 25 mcg of levothyroxine, using half tablet twice daily  Although her thyroid levels were normal her liothyronine was switched to 10 mcg instead of 12.5 for better compliance Also levothyroxine was increased from 100 up to 112 mcg  Her thyroid supplements were continued unchanged on her last visit in 04/2019 She does not think she feels any new fatigue Her weight is about the same No cold intolerance, hair loss or dry skin  She is very regular with taking her supplement before her breakfast in the morning She did not think she has missed any doses   Wt Readings from Last 3 Encounters:  12/23/19 238 lb 6.4 oz (108.1 kg)  08/31/19 239 lb 3.2 oz (108.5 kg)  10/06/18 243 lb (110.2 kg)    Labs show a relatively high TSH of 7.7 without significant change in T4 or T3    Lab Results  Component Value Date   FREET4 0.81 12/07/2019   FREET4 0.91 05/19/2019   FREET4 0.73 09/30/2018   TSH 7.67 (H) 12/07/2019   TSH 2.32 05/19/2019   TSH 2.81 09/30/2018   Lab Results  Component Value Date   T3FREE 2.7 12/07/2019   T3FREE 2.8 05/19/2019   T3FREE 3.3 03/24/2018    THYROID  cyst:  The patient's thyroid nodule was first discovered in early 2016 soon after she had a respiratory infection in 11/2014 She had felt a swelling on the left side of her neck and was seen by her PCP for evaluation in 3/16 She did not have any swelling prior to that.  She does not feel that her neck swelling has increased in size and it may have moved more towards the center of the neck  The thyroid ultrasound showed the following Dominant simple appearing cyst occupies much of the left lobe and measures approximately 5.2 x 5.4 x 4.6 cm.  This cyst does not have any complex features or mural nodularity. On review of her previous records she had a predominantly cystic lesion in the upper pole of her thyroid measuring 1.7 cm in 2003 This was biopsied in 2002 and was benign Also she had a subcentimeter cyst in the lower left pole of the left side in 2003  RECENT history: She had a recurrence of the cyst in 2017 with local pressure symptoms Aspiration of this cyst was successfully done in June 2017 by Dr. Loanne Drilling and about 100 mL of fluid removed which was negative for cytology  She has not felt any swelling in her neck subsequently   Allergies as of 12/23/2019      Reactions  Amoxicillin Nausea And Vomiting   Unknown   Pneumococcal 13-val Conj Vacc Other (See Comments)   Codeine Nausea And Vomiting, Rash, Nausea Only   Tetanus-diphth-acell Pertussis Palpitations      Medication List       Accurate as of December 23, 2019 11:59 PM. If you have any questions, ask your nurse or doctor.        cholecalciferol 1000 units tablet Commonly known as: VITAMIN D Take 7,000 Units by mouth daily.   hydrocortisone-pramoxine 2.5-1 % rectal cream Commonly known as: Analpram HC Place 1 application rectally 3 (three) times daily. What changed:   when to take this  reasons to take this   liothyronine 5 MCG tablet Commonly known as: CYTOMEL TAKE 2 TABLETS (10 MCG TOTAL) BY MOUTH DAILY.    Magnesium 300 MG Caps Take 1 capsule by mouth daily. Reported on 02/14/2016   meloxicam 15 MG tablet Commonly known as: MOBIC meloxicam 15 mg tablet  TAKE 1 TABLET BY MOUTH EVERY DAY   ranitidine 150 MG tablet Commonly known as: Zantac Take 1 tablet (150 mg total) by mouth 2 (two) times daily. What changed:   when to take this  reasons to take this   simvastatin 40 MG tablet Commonly known as: ZOCOR TAKE 1/2 TABLET BY MOUTH AT BEDTIME   spironolactone 25 MG tablet Commonly known as: ALDACTONE TAKE 1 TABLET BY MOUTH EVERY DAY IN THE MORNING   Synthroid 112 MCG tablet Generic drug: levothyroxine TAKE 1 TABLET BY MOUTH EVERY DAY       Allergies:  Allergies  Allergen Reactions  . Amoxicillin Nausea And Vomiting    Unknown  . Pneumococcal 13-Val Conj Vacc Other (See Comments)  . Codeine Nausea And Vomiting, Rash and Nausea Only  . Tetanus-Diphth-Acell Pertussis Palpitations    Past Medical History:  Diagnosis Date  . Allergy   . Anxiety   . Arthritis   . Cataract   . Cystocele   . Fibroids   . Gastric reflux   . Generalized headaches   . H. pylori infection    8 years ago  . Heart murmur    Mild mitral regurgitation- pt denies this 07-02-16 at [facility]  . Hiatal hernia   . Hypertension   . Obstructive sleep apnea    uses CPAP  . Pain in limb   . Sleep apnea    uses CPAP  . Thyroid disease   . Tubular adenoma of colon   . Varicose veins     There is no history of radiation to the neck in childhood  Past Surgical History:  Procedure Laterality Date  . BREATH TEK H PYLORI N/A 05/14/2016   Procedure: BREATH TEK H PYLORI;  Surgeon: Jerene Bears, MD;  Location: [facility];  Service: Gastroenterology;  Laterality: N/A;  . COLONOSCOPY     Dr Lajoyce Corners  . ESOPHAGOGASTRODUODENOSCOPY     Dr Lajoyce Corners  . TONSILECTOMY, ADENOIDECTOMY, [place]    . UPPER GASTROINTESTINAL ENDOSCOPY      Family History  Problem Relation Age of Onset  . Throat  cancer Father        worked at [employer]  . Hypothyroidism Sister   . Kidney disease Mother   . Colon cancer Neg Hx   . Esophageal cancer Neg Hx   . Rectal cancer Neg Hx   . Stomach cancer Neg Hx   . Colon polyps Neg Hx     Social History:  reports that she  has never smoked. She has never used smokeless tobacco. She reports that she does not drink alcohol or use drugs.   Review of Systems:      She has had sleep apnea diagnosed in 2013 using CPAP, using this regularly  She is concerned about her blood pressure being higher today       BP Readings from Last 3 Encounters:  12/23/19 (!) 146/70  08/31/19 127/66  10/06/18 128/80     Examination:   BP (!) 146/70 (BP Location: Left Arm, Patient Position: Sitting, Cuff Size: Large)   Pulse 88   Ht 5\' 3"  (1.6 m)   Wt 238 lb 6.4 oz (108.1 kg)   SpO2 97%   BMI 42.23 kg/m        Repeat blood pressure 134/84  Thyroid is not palpable  Biceps reflexes are normal   Assessment/Plan:  HYPOTHYROIDISM:   She is on liothyronine and levothyroxine With taking 10 mcg of liothyronine and 112 of levothyroxine she previously had normal thyroid levels  Recently she has not missed any doses However TSH is up at 7.7 She is asymptomatic  She will now increase her dose of levothyroxine to 125 instead of 112   History of thyroid cyst on the left lobe:  This has completely resolved with aspiration in June 2017 and has no recurrence  Mild increase in blood pressure: She will follow-up with her PCP  Follow-up in 3 months  Preslie Depasquale Dwyane Dee 12/24/2019

## 2019-12-24 MED ORDER — SYNTHROID 125 MCG PO TABS: 125.0000 ug | ORAL_TABLET | Freq: Every day | ORAL | 1 refills | Status: DC

## 2020-03-21 ENCOUNTER — Other Ambulatory Visit (INDEPENDENT_AMBULATORY_CARE_PROVIDER_SITE_OTHER): Payer: Medicare Other

## 2020-03-21 ENCOUNTER — Other Ambulatory Visit: Payer: Self-pay

## 2020-03-21 DIAGNOSIS — E038 Other specified hypothyroidism: Secondary | ICD-10-CM

## 2020-03-21 LAB — T4, FREE: Free T4: 0.92 ng/dL (ref 0.60–1.60)

## 2020-03-21 LAB — TSH: TSH: 1.26 u[IU]/mL (ref 0.35–4.50)

## 2020-03-21 LAB — T3, FREE: T3, Free: 3.9 pg/mL (ref 2.3–4.2)

## 2020-03-29 ENCOUNTER — Ambulatory Visit (INDEPENDENT_AMBULATORY_CARE_PROVIDER_SITE_OTHER): Payer: Medicare Other | Admitting: Endocrinology

## 2020-03-29 ENCOUNTER — Encounter: Payer: Self-pay | Admitting: Endocrinology

## 2020-03-29 ENCOUNTER — Other Ambulatory Visit: Payer: Self-pay

## 2020-03-29 VITALS — BP 140/74 | HR 85 | Ht 63.0 in | Wt 241.0 lb

## 2020-03-29 DIAGNOSIS — R400 Somnolence: Secondary | ICD-10-CM | POA: Diagnosis not present

## 2020-03-29 DIAGNOSIS — E038 Other specified hypothyroidism: Secondary | ICD-10-CM | POA: Diagnosis not present

## 2020-03-29 NOTE — Progress Notes (Signed)
Patient ID: Sherry Bell, female   DOB: 1946/06/03, 74 y.o.   MRN: [id number]           Reason for Appointment: Follow-up of hypothyroidism and of thyroid cyst    History of Present Illness:    HYPOTHYROIDISM:  This was diagnosed probably in the 1990s when the patient had gone to her physician because of weight gain. She does not think she had any unusual fatigue at that time She was found to be hypothyroid She does not think she felt any different with taking the thyroid supplements but her weight stabilized.  Her previous endocrinologist put her on combination of Synthroid and Cytomel 25 g, 1/2 tablet, this was prescribed twice a day  Subsequently she had been taking the Cytomel only in the morning since she would forget the evening dose Prior to her visit here she has been on 25 mcg of levothyroxine, using half tablet twice daily  Recent history:  On her initial consultation her liothyronine was switched to 10 mcg instead of 12.5 for better compliance Also levothyroxine was increased from 100 up to 112 mcg  She thinks that her thyroid medication makes her sleepy but it may be more after lunch Has about the same weight with slight weight gain She has had some hair loss which is not new No cold intolerance or dry skin  Not clear if she felt any different with increasing her levothyroxine to 125 mcg since 11/2019  She is very regular with taking both her levothyroxine and liothyronine supplement before her breakfast in the morning Has been refilling medication regularly   Wt Readings from Last 3 Encounters:  03/29/20 241 lb (109.3 kg)  12/23/19 238 lb 6.4 oz (108.1 kg)  08/31/19 239 lb 3.2 oz (108.5 kg)    Previously had a TSH of 7.7 and this is now back down to 1.3 Free T3 level is improved    Lab Results  Component Value Date   FREET4 0.92 03/21/2020   FREET4 0.81 12/07/2019   FREET4 0.91 05/19/2019   TSH 1.26 03/21/2020   TSH 7.67 (H) 12/07/2019   TSH 2.32  05/19/2019   Lab Results  Component Value Date   T3FREE 3.9 03/21/2020   T3FREE 2.7 12/07/2019   T3FREE 2.8 05/19/2019    THYROID cyst:  The patient's thyroid nodule was first discovered in early 2016 soon after she had a respiratory infection in 11/2014 She had felt a swelling on the left side of her neck and was seen by her PCP for evaluation in 3/16 She did not have any swelling prior to that.  She does not feel that her neck swelling has increased in size and it may have moved more towards the center of the neck  The thyroid ultrasound showed the following Dominant simple appearing cyst occupies much of the left lobe and measures approximately 5.2 x 5.4 x 4.6 cm.  This cyst does not have any complex features or mural nodularity. On review of her previous records she had a predominantly cystic lesion in the upper pole of her thyroid measuring 1.7 cm in 2003 This was biopsied in 2002 and was benign Also she had a subcentimeter cyst in the lower left pole of the left side in 2003  RECENT history: She had a recurrence of the cyst in 2017 with local pressure symptoms Aspiration of this cyst was successfully done in June 2017 by Dr. Loanne Drilling and about 100 mL of fluid removed which was negative for cytology  No recurrence of the cyst since then   Allergies as of 03/29/2020      Reactions   Amoxicillin Nausea And Vomiting   Unknown   Pneumococcal 13-val Conj Vacc Other (See Comments)   Codeine Nausea And Vomiting, Rash, Nausea Only   Tetanus-diphth-acell Pertussis Palpitations      Medication List       Accurate as of Mar 29, 2020 10:08 AM. If you have any questions, ask your nurse or doctor.        STOP taking these medications   ranitidine 150 MG tablet Commonly known as: Zantac Stopped by: Elayne Snare, MD   simvastatin 40 MG tablet Commonly known as: ZOCOR Stopped by: Elayne Snare, MD     TAKE these medications   cholecalciferol 1000 units tablet Commonly known as:  VITAMIN D Take 7,000 Units by mouth daily.   hydrocortisone-pramoxine 2.5-1 % rectal cream Commonly known as: Analpram HC Place 1 application rectally 3 (three) times daily. What changed:   when to take this  reasons to take this   liothyronine 5 MCG tablet Commonly known as: CYTOMEL TAKE 2 TABLETS (10 MCG TOTAL) BY MOUTH DAILY.   Magnesium 300 MG Caps Take 1 capsule by mouth daily. Reported on 02/14/2016   meloxicam 15 MG tablet Commonly known as: MOBIC Take 15 mg by mouth daily as needed.   spironolactone 25 MG tablet Commonly known as: ALDACTONE TAKE 1 TABLET BY MOUTH EVERY DAY IN THE MORNING   Synthroid 125 MCG tablet Generic drug: levothyroxine Take 1 tablet (125 mcg total) by mouth daily before breakfast.       Allergies:  Allergies  Allergen Reactions  . Amoxicillin Nausea And Vomiting    Unknown  . Pneumococcal 13-Val Conj Vacc Other (See Comments)  . Codeine Nausea And Vomiting, Rash and Nausea Only  . Tetanus-Diphth-Acell Pertussis Palpitations    Past Medical History:  Diagnosis Date  . Allergy   . Anxiety   . Arthritis   . Cataract   . Cystocele   . Fibroids   . Gastric reflux   . Generalized headaches   . H. pylori infection    8 years ago  . Heart murmur    Mild mitral regurgitation- pt denies this 07-02-16 at her [facility]  . Hiatal hernia   . Hypertension   . Obstructive sleep apnea    uses CPAP  . Pain in limb   . Sleep apnea    uses CPAP  . Thyroid disease   . Tubular adenoma of colon   . Varicose veins     There is no history of radiation to the neck in childhood  Past Surgical History:  Procedure Laterality Date  . BREATH TEK H PYLORI N/A 05/14/2016   Procedure: BREATH TEK H PYLORI;  Surgeon: Jerene Bears, MD;  Location: [facility];  Service: Gastroenterology;  Laterality: N/A;  . COLONOSCOPY     Dr Lajoyce Corners  . ESOPHAGOGASTRODUODENOSCOPY     Dr Lajoyce Corners  . TONSILECTOMY, ADENOIDECTOMY, [place]    . UPPER  GASTROINTESTINAL ENDOSCOPY      Family History  Problem Relation Age of Onset  . Throat cancer Father        worked at [employer]  . Hypothyroidism Sister   . Kidney disease Mother   . Colon cancer Neg Hx   . Esophageal cancer Neg Hx   . Rectal cancer Neg Hx   . Stomach cancer Neg Hx   . Colon polyps  Neg Hx     Social History:  reports that she has never smoked. She has never used smokeless tobacco. She reports that she does not drink alcohol or use drugs.   Review of Systems:     She has had sleep apnea diagnosed in 2013 using CPAP She still tends to have some sleepiness in the afternoon       BP Readings from Last 3 Encounters:  03/29/20 140/74  12/23/19 (!) 146/70  08/31/19 127/66   She is asking about getting a weight loss medication    Examination:   BP 140/74 (BP Location: Left Arm, Patient Position: Sitting, Cuff Size: Large)   Pulse 85   Ht 5\' 3"  (1.6 m)   Wt 241 lb (109.3 kg)   SpO2 97%   BMI 42.69 kg/m         Right lobe not palpable of the thyroid  Mild alopecia on the vertex No pedal edema   Assessment/Plan:  HYPOTHYROIDISM:   She is on liothyronine and levothyroxine With taking 125 mcg levothyroxine her TSH is back to normal She is also on liothyronine and T3 level improved at 3.9 She will continue the same dose regimen  She is asking about checking for antibodies and discussed that this will not change her treatment and she has already had established autoimmune disease for several years without residual thyroid function likely at this time.  Discussed nature of Hashimoto's thyroiditis  Although she is complaining of some sleepiness discussed that this is not related to her thyroid medication or hypothyroidism now  She will continue to work with her pulmonologist for her sleep apnea Also needs to lose weight especially with her A1c of 5.8  She will also need to schedule follow-up with her PCP regarding general exam and follow-up of her  A1c and glucose  Hair loss: She has had mild chronic hair loss and this may be autoimmune  Morbid obesity: She will need to be referred to bariatric weight loss program, discussed that just taking a pill for weight loss is not enough  Follow-up in 6 months  Kieran Arreguin Dwyane Dee 03/29/2020

## 2020-05-04 ENCOUNTER — Other Ambulatory Visit: Payer: Self-pay | Admitting: Endocrinology

## 2020-05-12 LAB — TSH: TSH: 0.62 (ref <=5.90)

## 2020-06-08 ENCOUNTER — Other Ambulatory Visit: Payer: Self-pay | Admitting: Endocrinology

## 2020-08-22 ENCOUNTER — Encounter: Payer: Self-pay | Admitting: Cardiology

## 2020-08-22 ENCOUNTER — Other Ambulatory Visit: Payer: Self-pay

## 2020-08-22 ENCOUNTER — Ambulatory Visit: Payer: Medicare Other | Admitting: Cardiology

## 2020-08-22 VITALS — BP 157/77 | HR 74 | Resp 16 | Ht 63.0 in | Wt 243.0 lb

## 2020-08-22 DIAGNOSIS — I10 Essential (primary) hypertension: Secondary | ICD-10-CM

## 2020-08-22 DIAGNOSIS — G588 Other specified mononeuropathies: Secondary | ICD-10-CM

## 2020-08-22 DIAGNOSIS — E7800 Pure hypercholesterolemia, unspecified: Secondary | ICD-10-CM

## 2020-08-22 MED ORDER — AMLODIPINE BESYLATE 5 MG PO TABS: 5.0000 mg | ORAL_TABLET | Freq: Every day | ORAL | 2 refills | Status: DC

## 2020-08-22 MED ORDER — GABAPENTIN 100 MG PO CAPS: 100.0000 mg | ORAL_CAPSULE | Freq: Two times a day (BID) | ORAL | 2 refills | Status: DC

## 2020-08-22 NOTE — Progress Notes (Signed)
Primary Physician/Referring:  Hulan Fess, MD  Patient ID: Sherry Bell, female    DOB: [birth year], 74 y.o.   MRN: [id number]  Chief Complaint  Patient presents with  . Hypertension  . Hyperlipidemia  . Follow-up    1 year   HPI:    Sherry Bell  is a 74 y.o. Caucasian female patient with history of HTN, very mild HLD with elevated LDL particle number, obesity, and pre-diabetes. Patient also has sleep apnea and has been using CPAP. She is here on a annual office visit and follow-up of hypertension.  The patient presents for annual follow up. She complains of constant, aching pains in her feet and lower legs. This has been ongoing for some time. She also notes she had COVID in March, fortunately a mild infection.   Past Medical History:  Diagnosis Date  . Allergy   . Anxiety   . Arthritis   . Cataract   . Cystocele   . Fibroids   . Gastric reflux   . Generalized headaches   . H. pylori infection    8 years ago  . Heart murmur    Mild mitral regurgitation- pt denies this 07-02-16 at [facility]  . Hiatal hernia   . Hypertension   . Obstructive sleep apnea    uses CPAP  . Pain in limb   . Sleep apnea    uses CPAP  . Thyroid disease   . Tubular adenoma of colon   . Varicose veins    Past Surgical History:  Procedure Laterality Date  . BREATH TEK H PYLORI N/A 05/14/2016   Procedure: BREATH TEK H PYLORI;  Surgeon: Jerene Bears, MD;  Location: [facility];  Service: Gastroenterology;  Laterality: N/A;  . COLONOSCOPY     Dr Lajoyce Corners  . ESOPHAGOGASTRODUODENOSCOPY     Dr Lajoyce Corners  . TONSILECTOMY, ADENOIDECTOMY, [place]    . UPPER GASTROINTESTINAL ENDOSCOPY     Social History   Tobacco Use  . Smoking status: Never Smoker  . Smokeless tobacco: Never Used  Substance Use Topics  . Alcohol use: No   Marital Status: Single  ROS  Review of Systems  Cardiovascular: Negative for chest pain, dyspnea on exertion and leg swelling.  Musculoskeletal:  Positive for joint pain.  Gastrointestinal: Negative for melena.   Objective   Vitals with BMI 08/22/2020 03/29/2020 12/23/2019  Height '5\' 3"'  '5\' 3"'  '5\' 3"'   Weight 243 lbs 241 lbs 238 lbs 6 oz  BMI 43.06 42.6 83.41  Systolic 962 229 798  Diastolic 77 74 70  Pulse 74 85 88    Blood pressure (!) 157/77, pulse 74, resp. rate 16, height '5\' 3"'  (1.6 m), weight 243 lb (110.2 kg), SpO2 97 %. Body mass index is 43.05 kg/m.   Physical Exam Constitutional:      General: She is not in acute distress.    Appearance: She is well-developed. She is obese.     Comments: Morbidly obese  Neck:     Thyroid: No thyromegaly.     Comments: Short neck and difficult to evaluate JVP Cardiovascular:     Rate and Rhythm: Normal rate and regular rhythm.     Pulses:          Carotid pulses are 2+ on the right side and 2+ on the left side.      Dorsalis pedis pulses are 2+ on the right side and 2+ on the left side.  Posterior tibial pulses are 2+ on the right side and 2+ on the left side.     Heart sounds: Normal heart sounds. No murmur heard.  No gallop.      Comments: Femoral and popliteal pulse difficult to feel due to patient's body habitus.   Bilateral varicose veins noted. No edema. No JVD.  Pulmonary:     Effort: Pulmonary effort is normal.     Breath sounds: Normal breath sounds.  Abdominal:     General: Bowel sounds are normal.     Palpations: Abdomen is soft.     Comments: Obese. Pannus present  Skin:    General: Skin is warm.    Radiology: No results found.  Laboratory examination:   No results for input(s): NA, K, CL, CO2, GLUCOSE, BUN, CREATININE, CALCIUM, GFRNONAA, GFRAA in the last 8760 hours. CMP Latest Ref Rng & Units 07/27/2018 06/22/2017 04/17/2016  Glucose 70 - 99 mg/dL 91 151(H) 162(H)  BUN 8 - 23 mg/dL '13 11 16  ' Creatinine 0.44 - 1.00 mg/dL 0.76 0.90 0.91  Sodium 135 - 145 mmol/L 140 134(L) 136  Potassium 3.5 - 5.1 mmol/L 4.4 4.0 4.5  Chloride 98 - 111 mmol/L 103 100(L)  103  CO2 22 - 32 mmol/L '27 25 25  ' Calcium 8.9 - 10.3 mg/dL 8.8(L) 8.8(L) 9.1  Total Protein 6.5 - 8.1 g/dL 7.9 7.3 7.6  Total Bilirubin 0.3 - 1.2 mg/dL 0.9 1.5(H) 0.8  Alkaline Phos 38 - 126 U/L 45 43 49  AST 15 - 41 U/L '24 21 20  ' ALT 0 - 44 U/L '21 20 20   ' CBC Latest Ref Rng & Units 07/27/2018 06/22/2017 04/17/2016  WBC 4.0 - 10.5 K/uL 7.1 23.3(H) 13.5(H)  Hemoglobin 12.0 - 15.0 g/dL 13.0 12.1 13.2  Hematocrit 36 - 46 % 39.5 36.6 38.6  Platelets 150 - 400 K/uL 193 182 199   Lipid Panel  No results found for: CHOL, TRIG, HDL, CHOLHDL, VLDL, LDLCALC, LDLDIRECT   HEMOGLOBIN A1C No results found for: HGBA1C, MPG   TSH Recent Labs    12/07/19 0944 03/21/20 1035  TSH 7.67* 1.26   External Labs:  Labs 07/28/2019: Total cholesterol 216, triglycerides 103, HDL 54, LDL 142.  Non-HDL cholesterol 163.  Serum glucose 110 mg, frequency 5.8%, BUN 22, creatinine 0.87, eGFR greater than 60 him up.  Potassium 5.2.  CMP otherwise normal.  CBC normal  TSH 1.260 03/21/2020   Medications and allergies   Allergies  Allergen Reactions  . Amoxicillin Nausea And Vomiting    Unknown  . Pneumococcal 13-Val Conj Vacc Other (See Comments)  . Codeine Nausea And Vomiting, Rash and Nausea Only  . Tetanus-Diphth-Acell Pertussis Palpitations    Current Outpatient Medications on File Prior to Visit  Medication Sig Dispense Refill  . cholecalciferol (VITAMIN D) 1000 UNITS tablet Take 7,000 Units by mouth daily.     . hydrocortisone-pramoxine (ANALPRAM HC) 2.5-1 % rectal cream Place 1 application rectally 3 (three) times daily. (Patient taking differently: Place 1 application rectally as needed. ) 30 g 0  . liothyronine (CYTOMEL) 5 MCG tablet TAKE 2 TABLETS (10 MCG TOTAL) BY MOUTH DAILY. 180 tablet 1  . Magnesium 300 MG CAPS Take 1 capsule by mouth daily. Reported on 02/14/2016    . meloxicam (MOBIC) 15 MG tablet Take 15 mg by mouth daily as needed.     Marland Kitchen spironolactone (ALDACTONE) 25 MG tablet TAKE 1 TABLET  BY MOUTH EVERY DAY IN THE MORNING 90 tablet 2  .  SYNTHROID 125 MCG tablet TAKE 1 TABLET (125 MCG TOTAL) BY MOUTH DAILY BEFORE BREAKFAST. 90 tablet 1   No current facility-administered medications on file prior to visit.    Cardiac Studies:   SLEEP STUDY 03/24/12 (POSITIVE) follows Dr. Maxwell Caul and on CPAP and compliant.  Treadmill exercise stress test 03/25/2017: Indication: SoB and HTN Resting EKG demonstrates NSR. The patient exercised according to Bruce Protocol, Total time recorded 5:29 min achieving max heart rate of 146 which was 96% of THR for age and 7.05 METS of work. Stress terminated due to dyspnea, fatigue and THR (>85% MPHR)/MPHR met. Normal BP response. There was no ST-T changes of ischemia with exercise stress test. There were no significant arrhythmias. Normal BP response. Rec: No e/o ischemia by GXT. Exercise tolerence is markedly reduced due to aerobic intolerence. Continue Preventive therapy.  Echocardiogram 04/09/2017: Left ventricle cavity is normal in size. Moderate concentric hypertrophy of the left ventricle. Normal global wall motion. Doppler evidence of grade II (pseudonormal) diastolic dysfunction, elevated LAP. Calculated EF 56%. Left atrial cavity is mildly dilated at 4.1 cm. Mild (Grade I) mitral regurgitation. Mild tricuspid regurgitation. No evidence of pulmonary hypertension. Compared to the study done on 6/17/23rd in, grade 2 diastolic dysfunction, MR and TR new.  EKG:  EKG 08/22/2020: Normal sinus rhythm at rate of 72 bpm, normal axis.  Poor R wave progression, pulmonary disease pattern.  Low-voltage complexes in precordial leads.  EKG 08/31/2019: Normal sinus rhythm at rate of 72 bpm, normal axis.  No evidence of ischemia, normal EKG. No significant change from EKG 12/17/2017  Assessment     ICD-10-CM   1. Essential hypertension  I10 EKG 12-Lead    amLODipine (NORVASC) 5 MG tablet  2. Hypercholesteremia  E78.00   3. Other mononeuropathy  G58.8  gabapentin (NEURONTIN) 100 MG capsule    Meds ordered this encounter  Medications  . amLODipine (NORVASC) 5 MG tablet    Sig: Take 1 tablet (5 mg total) by mouth daily.    Dispense:  30 tablet    Refill:  2  . gabapentin (NEURONTIN) 100 MG capsule    Sig: Take 1 capsule (100 mg total) by mouth 2 (two) times daily.    Dispense:  60 capsule    Refill:  2   Medications Discontinued During This Encounter  Medication Reason  . 0.9 %  sodium chloride infusion Error   Recommendations:   Sherry Bell is a 74 y.o. Caucasian female patient with history of HTN, very mild HLD with elevated LDL particle number, obesity, and pre-diabetes. Patient also has sleep apnea and has been using CPAP.   The patient presents for annual visit and follow up of hypertension. Her blood pressure is uncontrolled. She has noticed this when she checks it at home as well. She is presently on spironolactone. I will add amlodipine 5 mg daily. She is encouraged to continue monitoring her blood pressure at home. With regard to hyperlipidemia, the patient had elevated LDL with her last labs. At last visit she was prescribed Simvastatin, but tells me she never started the medication due to feeling hesitant about trying a statin.   Additionally, she complains of chronic achy pains in bilateral feet and lower leg, suggestive of neuropathy. I have prescribed gabapentin 100 mg twice daily to see if this helps her pain. We will see her back for follow up in 6 weeks for hypertension. I have also encouraged her to follow up with her primary care physician for an annual  visit.  Blair Heys, PA Student 08/22/20 12:48 PM   Patient seen and examined in conjunction with Blair Heys, PA second year student at [employer].  Time spent is in direct patient face to face encounter not including the teaching and training involved. I will try Neurontin for her chronic leg pain which I suspect is probably nonspecific peripheral neuropathy,  if it does improve, she can follow-up with her PCP for further management.   Adrian Prows, MD, [facility] 08/22/2020, 12:48 PM Office: [phone]

## 2020-08-24 ENCOUNTER — Telehealth: Payer: Self-pay

## 2020-09-01 ENCOUNTER — Other Ambulatory Visit: Payer: Self-pay | Admitting: Cardiology

## 2020-09-01 DIAGNOSIS — I10 Essential (primary) hypertension: Secondary | ICD-10-CM

## 2020-09-26 ENCOUNTER — Other Ambulatory Visit: Payer: Self-pay

## 2020-09-26 ENCOUNTER — Other Ambulatory Visit (INDEPENDENT_AMBULATORY_CARE_PROVIDER_SITE_OTHER): Payer: Medicare Other

## 2020-09-26 DIAGNOSIS — E038 Other specified hypothyroidism: Secondary | ICD-10-CM

## 2020-09-26 LAB — T3, FREE: T3, Free: 3.3 pg/mL (ref 2.3–4.2)

## 2020-09-26 LAB — TSH: TSH: 0.52 u[IU]/mL (ref 0.35–4.50)

## 2020-09-26 LAB — T4, FREE: Free T4: 0.96 ng/dL (ref 0.60–1.60)

## 2020-09-29 ENCOUNTER — Other Ambulatory Visit: Payer: Self-pay | Admitting: Student

## 2020-09-29 DIAGNOSIS — I10 Essential (primary) hypertension: Secondary | ICD-10-CM

## 2020-10-03 ENCOUNTER — Ambulatory Visit: Payer: Medicare Other | Admitting: Endocrinology

## 2020-10-04 NOTE — Progress Notes (Signed)
Primary Physician/Referring:  Hulan Fess, MD  Patient ID: Sherry Bell, female    DOB: [birth year], 74 y.o.   MRN: [id number]  No chief complaint on file.  HPI:    Sherry Bell  is a 74 y.o. Caucasian female patient with history of HTN, very mild HLD with elevated LDL particle number, obesity, and pre-diabetes. Patient also has sleep apnea and has been using CPAP. She is here on a annual office visit and follow-up of hypertension.   Patient presents for 6-week follow-up of hypertension.  At last visit added amlodipine 5 mg daily and also start her on gabapentin 100 mg twice daily for bilateral feet and lower leg pain suggestive of neuropathy.  She continues to have bilateral feet and lower leg pain, however she has only been taking gabapentin once daily.  She has tolerated amlodipine well and reports blood pressure readings 128/70 and 122/70 at other doctor's appointments over the last 2 weeks.  She does not monitor her blood pressure regularly at home.  She reports she saw her PCP Dr. Rex Kras earlier this morning and he ordered lab work to be done.  Of note she is currently not taking previously prescribed simvastatin.  Past Medical History:  Diagnosis Date  . Allergy   . Anxiety   . Arthritis   . Cataract   . Cystocele   . Fibroids   . Gastric reflux   . Generalized headaches   . H. pylori infection    8 years ago  . Heart murmur    Mild mitral regurgitation- pt denies this 07-02-16 at her [facility]  . Hiatal hernia   . Hypertension   . Obstructive sleep apnea    uses CPAP  . Pain in limb   . Sleep apnea    uses CPAP  . Thyroid disease   . Tubular adenoma of colon   . Varicose veins    Past Surgical History:  Procedure Laterality Date  . BREATH TEK H PYLORI N/A 05/14/2016   Procedure: BREATH TEK H PYLORI;  Surgeon: Jerene Bears, MD;  Location: [facility];  Service: Gastroenterology;  Laterality: N/A;  . COLONOSCOPY     Dr Lajoyce Corners  . ESOPHAGOGASTRODUODENOSCOPY     Dr Lajoyce Corners  .  TONSILECTOMY, ADENOIDECTOMY, [place]    . UPPER GASTROINTESTINAL ENDOSCOPY     Social History   Tobacco Use  . Smoking status: Never Smoker  . Smokeless tobacco: Never Used  Substance Use Topics  . Alcohol use: No   Marital Status: Single  ROS  Review of Systems  Constitutional: Negative for malaise/fatigue and weight gain.  Cardiovascular: Negative for chest pain, claudication, dyspnea on exertion, leg swelling, near-syncope, orthopnea, palpitations, paroxysmal nocturnal dyspnea and syncope.  Respiratory: Negative for shortness of breath.   Hematologic/Lymphatic: Does not bruise/bleed easily.  Musculoskeletal: Positive for joint pain.  Gastrointestinal: Negative for melena.  Neurological: Negative for dizziness and weakness.   Objective   Vitals with BMI 08/22/2020 03/29/2020 12/23/2019  Height _0  _1  _2   Weight 243 lbs 241 lbs 238 lbs 6 oz  BMI 43.06 50.0 93.81  Systolic 829 937 169  Diastolic 77 74 70  Pulse 74 85 88    There were no vitals taken for this visit. There is no height or weight on file to calculate BMI.   Physical Exam Constitutional:      General: She is not in acute distress.    Appearance: She is well-developed. She is  obese.     Comments: Morbidly obese  Neck:     Thyroid: No thyromegaly.     Comments: Short neck and difficult to evaluate JVP Cardiovascular:     Rate and Rhythm: Normal rate and regular rhythm.     Pulses:          Carotid pulses are 2+ on the right side and 2+ on the left side.      Dorsalis pedis pulses are 2+ on the right side and 2+ on the left side.       Posterior tibial pulses are 2+ on the right side and 2+ on the left side.     Heart sounds: Normal heart sounds. No murmur heard.  No gallop.      Comments: Femoral and popliteal pulse difficult to feel due to patient's body habitus.   Bilateral varicose veins noted. No edema. No JVD.  Pulmonary:     Effort: Pulmonary effort is normal.      Breath sounds: Normal breath sounds.  Abdominal:     General: Bowel sounds are normal.     Palpations: Abdomen is soft.     Comments: Obese. Pannus present  Musculoskeletal:        General: Tenderness (bilateral ankles) present.  Skin:    General: Skin is warm.     Laboratory examination:   No results for input(s): NA, K, CL, CO2, GLUCOSE, BUN, CREATININE, CALCIUM, GFRNONAA, GFRAA in the last 8760 hours. CMP Latest Ref Rng & Units 07/27/2018 06/22/2017 04/17/2016  Glucose 70 - 99 mg/dL 91 151(H) 162(H)  BUN 8 - 23 mg/dL _0 Creatinine 0.44 - 1.00 mg/dL 0.76 0.90 0.91  Sodium 135 - 145 mmol/L 140 134(L) 136  Potassium 3.5 - 5.1 mmol/L 4.4 4.0 4.5  Chloride 98 - 111 mmol/L 103 100(L) 103  CO2 22 - 32 mmol/L _1 Calcium 8.9 - 10.3 mg/dL 8.8(L) 8.8(L) 9.1  Total Protein 6.5 - 8.1 g/dL 7.9 7.3 7.6  Total Bilirubin 0.3 - 1.2 mg/dL 0.9 1.5(H) 0.8  Alkaline Phos 38 - 126 U/L 45 43 49  AST 15 - 41 U/L _2 ALT 0 - 44 U/L _3 CBC Latest Ref Rng & Units 07/27/2018 06/22/2017 04/17/2016  WBC 4.0 - 10.5 K/uL 7.1 23.3(H) 13.5(H)  Hemoglobin 12.0 - 15.0 g/dL 13.0 12.1 13.2  Hematocrit 36 - 46 % 39.5 36.6 38.6  Platelets 150 - 400 K/uL 193 182 199   Lipid Panel  No results found for: CHOL, TRIG, HDL, CHOLHDL, VLDL, LDLCALC, LDLDIRECT   HEMOGLOBIN A1C No results found for: HGBA1C, MPG   TSH Recent Labs    12/07/19 0944 03/21/20 1035 09/26/20 1000  TSH 7.67* 1.26 0.52   External Labs:  Labs 07/28/2019: Total cholesterol 216, triglycerides 103, HDL 54, LDL 142.  Non-HDL cholesterol 163.  Serum glucose 110 mg, frequency 5.8%, BUN 22, creatinine 0.87, eGFR greater than 60 him up.  Potassium 5.2.  CMP otherwise normal.  CBC normal  TSH 1.260 03/21/2020   Medications and allergies   Allergies  Allergen Reactions  . Amoxicillin Nausea And Vomiting    Unknown  . Pneumococcal 13-Val Conj Vacc Other (See Comments)  . Codeine Nausea And Vomiting, Rash and Nausea Only   . Tetanus-Diphth-Acell Pertussis Palpitations    Current Outpatient Medications on File Prior to Visit  Medication Sig Dispense Refill  . amLODipine (NORVASC) 5 MG tablet Take 1 tablet (5 mg total) by  mouth daily. 30 tablet 2  . cholecalciferol (VITAMIN D) 1000 UNITS tablet Take 7,000 Units by mouth daily.     Marland Kitchen gabapentin (NEURONTIN) 100 MG capsule Take 1 capsule (100 mg total) by mouth 2 (two) times daily. 60 capsule 2  . hydrocortisone-pramoxine (ANALPRAM HC) 2.5-1 % rectal cream Place 1 application rectally 3 (three) times daily. (Patient taking differently: Place 1 application rectally as needed. ) 30 g 0  . liothyronine (CYTOMEL) 5 MCG tablet TAKE 2 TABLETS (10 MCG TOTAL) BY MOUTH DAILY. 180 tablet 1  . Magnesium 300 MG CAPS Take 1 capsule by mouth daily. Reported on 02/14/2016    . meloxicam (MOBIC) 15 MG tablet Take 15 mg by mouth daily as needed.     Marland Kitchen spironolactone (ALDACTONE) 25 MG tablet TAKE 1 TABLET BY MOUTH EVERY DAY IN THE MORNING 30 tablet 1  . SYNTHROID 125 MCG tablet TAKE 1 TABLET (125 MCG TOTAL) BY MOUTH DAILY BEFORE BREAKFAST. 90 tablet 1   No current facility-administered medications on file prior to visit.    Radiology:  No results found.  Cardiac Studies:   SLEEP STUDY 03/24/12 (POSITIVE) follows Dr. Maxwell Caul and on CPAP and compliant.  Treadmill exercise stress test 03/25/2017: Indication: SoB and HTN Resting EKG demonstrates NSR. The patient exercised according to Bruce Protocol, Total time recorded 5:29 min achieving max heart rate of 146 which was 96% of THR for age and 7.05 METS of work. Stress terminated due to dyspnea, fatigue and THR (>85% MPHR)/MPHR met. Normal BP response. There was no ST-T changes of ischemia with exercise stress test. There were no significant arrhythmias. Normal BP response. Rec: No e/o ischemia by GXT. Exercise tolerence is markedly reduced due to aerobic intolerence. Continue Preventive therapy.  Echocardiogram 04/09/2017: Left  ventricle cavity is normal in size. Moderate concentric hypertrophy of the left ventricle. Normal global wall motion. Doppler evidence of grade II (pseudonormal) diastolic dysfunction, elevated LAP. Calculated EF 56%. Left atrial cavity is mildly dilated at 4.1 cm. Mild (Grade I) mitral regurgitation. Mild tricuspid regurgitation. No evidence of pulmonary hypertension. Compared to the study done on 6/17/23rd in, grade 2 diastolic dysfunction, MR and TR new.  EKG   EKG 08/22/2020: Normal sinus rhythm at rate of 72 bpm, normal axis.  Poor R wave progression, pulmonary disease pattern.  Low-voltage complexes in precordial leads.  EKG 08/31/2019: Normal sinus rhythm at rate of 72 bpm, normal axis.  No evidence of ischemia, normal EKG. No significant change from EKG 12/17/2017  Assessment     ICD-10-CM   1. Essential hypertension  I10     No orders of the defined types were placed in this encounter.  There are no discontinued medications. Recommendations:   Sherry Bell is a 74 y.o. Caucasian female patient with history of HTN, very mild HLD with elevated LDL particle number, obesity, and pre-diabetes. Patient also has sleep apnea and has been using CPAP.   Patient presents for 6-week follow-up of hypertension.  Since adding amlodipine 5 mg daily patient's blood pressure has been well controlled.  It was initially elevated in the office today, however upon recheck it improved.  We will continue amlodipine and spironolactone.  Instructed patient to monitor blood pressure at home on a regular basis and to keep a log to bring with her at next visit.  Patient continues to have bilateral pain in her lower extremities suggestive of neuropathy.  She has only been taking gabapentin once daily, encouraged her to take as prescribed 100  mg twice daily.  If pain continues suggested she follow-up with her PCP Dr. Rex Kras as patient states she did not discuss this pain with him earlier today.  In regard to  hyperlipidemia and previously prescribed simvastatin, will obtain lab work from PCP for review.  However will defer lipid management to primary care.  Follow up for hypertension and hyperlipidemia.    Alethia Berthold, PA-C 10/05/2020, 12:20 PM Office: [phone]

## 2020-10-05 ENCOUNTER — Encounter: Payer: Self-pay | Admitting: Student

## 2020-10-05 ENCOUNTER — Other Ambulatory Visit: Payer: Self-pay

## 2020-10-05 ENCOUNTER — Ambulatory Visit: Payer: Medicare Other | Admitting: Student

## 2020-10-05 ENCOUNTER — Other Ambulatory Visit: Payer: Self-pay | Admitting: Endocrinology

## 2020-10-05 VITALS — BP 124/78 | HR 80 | Resp 16 | Ht 63.0 in | Wt 243.0 lb

## 2020-10-05 DIAGNOSIS — I10 Essential (primary) hypertension: Secondary | ICD-10-CM

## 2020-10-05 LAB — LIPID PANEL
Cholesterol: 170 (ref 0–200)
HDL: 50 (ref 35–70)
LDL Cholesterol: 101
Triglycerides: 109 (ref 40–160)

## 2020-10-05 LAB — BASIC METABOLIC PANEL
BUN: 18 (ref 4–21)
Creatinine: 0.8 (ref 0.5–1.1)
Glucose: 128

## 2020-10-05 LAB — HEMOGLOBIN A1C: Hemoglobin A1C: 6.1

## 2020-10-10 ENCOUNTER — Telehealth: Payer: Self-pay | Admitting: Student

## 2020-10-10 ENCOUNTER — Encounter: Payer: Self-pay | Admitting: Endocrinology

## 2020-10-10 ENCOUNTER — Ambulatory Visit (INDEPENDENT_AMBULATORY_CARE_PROVIDER_SITE_OTHER): Payer: Medicare Other | Admitting: Endocrinology

## 2020-10-10 ENCOUNTER — Other Ambulatory Visit: Payer: Self-pay

## 2020-10-10 VITALS — BP 130/84 | HR 72 | Ht 63.0 in | Wt 240.4 lb

## 2020-10-10 DIAGNOSIS — E038 Other specified hypothyroidism: Secondary | ICD-10-CM | POA: Diagnosis not present

## 2020-10-10 NOTE — Progress Notes (Signed)
Patient ID: Sherry Bell, female   DOB: Jul 25, 1946, 74 y.o.   MRN: [id number]           Reason for Appointment: Follow-up of hypothyroidism and of thyroid cyst    History of Present Illness:    HYPOTHYROIDISM:  This was diagnosed probably in the 1990s when the patient had gone to her physician because of weight gain. She does not think she had any unusual fatigue at that time She was found to be hypothyroid She does not think she felt any different with taking the thyroid supplements but her weight stabilized.  Her previous endocrinologist put her on combination of Synthroid and Cytomel 25 g, 1/2 tablet, this was prescribed twice a day  Subsequently she had been taking the Cytomel only in the morning since she would forget the evening dose Prior to her visit here she has been on 25 mcg of levothyroxine, using half tablet twice daily  Recent history:  On her initial consultation her liothyronine was switched to 10 mcg instead of 12.5 for better compliance Also levothyroxine had been progressively increased until 1/21  Overall she feels fairly good  She has had some hair loss which is not as prominent No cold intolerance or dry skin  She is now taking 125 mcg of levothyroxine along with 10 mcg of liothyronine  She is regular with taking both her levothyroxine and liothyronine supplement before her breakfast in the morning No significant weight change   Wt Readings from Last 3 Encounters:  10/10/20 240 lb 6.4 oz (109 kg)  10/05/20 243 lb (110.2 kg)  08/22/20 243 lb (110.2 kg)    TSH is consistently normal, now 0.5 with stable levels of T3 and T4  Lab Results  Component Value Date   FREET4 0.96 09/26/2020   FREET4 0.92 03/21/2020   FREET4 0.81 12/07/2019   TSH 0.52 09/26/2020   TSH 1.26 03/21/2020   TSH 7.67 (H) 12/07/2019   Lab Results  Component Value Date   T3FREE 3.3 09/26/2020   T3FREE 3.9 03/21/2020   T3FREE 2.7 12/07/2019    THYROID cyst:  The  patient's thyroid nodule was first discovered in early 2016 soon after she had a respiratory infection in 11/2014 She had felt a swelling on the left side of her neck and was seen by her PCP for evaluation in 3/16 She did not have any swelling prior to that.  She does not feel that her neck swelling has increased in size and it may have moved more towards the center of the neck  The thyroid ultrasound showed the following Dominant simple appearing cyst occupies much of the left lobe and measures approximately 5.2 x 5.4 x 4.6 cm.  This cyst does not have any complex features or mural nodularity. On review of her previous records she had a predominantly cystic lesion in the upper pole of her thyroid measuring 1.7 cm in 2003 This was biopsied in 2002 and was benign Also she had a subcentimeter cyst in the lower left pole of the left side in 2003  She had a recurrence of the cyst in 2017 with local pressure symptoms Aspiration of this cyst was successfully done in June 2017 by Dr. Loanne Drilling and about 100 mL of fluid removed which was negative for cytology  No recurrence of the cyst since then   Allergies as of 10/10/2020      Reactions   Amoxicillin Nausea And Vomiting   Unknown   Pneumococcal 13-val Conj Vacc Other (  See Comments)   Codeine Nausea And Vomiting, Rash, Nausea Only   Tetanus-diphth-acell Pertussis Palpitations      Medication List       Accurate as of October 10, 2020 10:46 AM. If you have any questions, ask your nurse or doctor.        amLODipine 5 MG tablet Commonly known as: NORVASC Take 1 tablet (5 mg total) by mouth daily.   cholecalciferol 1000 units tablet Commonly known as: VITAMIN D Take 7,000 Units by mouth daily.   gabapentin 100 MG capsule Commonly known as: Neurontin Take 1 capsule (100 mg total) by mouth 2 (two) times daily.   liothyronine 5 MCG tablet Commonly known as: CYTOMEL TAKE 2 TABLETS (10 MCG TOTAL) BY MOUTH DAILY.   Magnesium 300 MG  Caps Take 1 capsule by mouth daily. Reported on 02/14/2016   meloxicam 15 MG tablet Commonly known as: MOBIC Take 15 mg by mouth daily as needed.   spironolactone 25 MG tablet Commonly known as: ALDACTONE TAKE 1 TABLET BY MOUTH EVERY DAY IN THE MORNING   Synthroid 125 MCG tablet Generic drug: levothyroxine TAKE 1 TABLET (125 MCG TOTAL) BY MOUTH DAILY BEFORE BREAKFAST.       Allergies:  Allergies  Allergen Reactions  . Amoxicillin Nausea And Vomiting    Unknown  . Pneumococcal 13-Val Conj Vacc Other (See Comments)  . Codeine Nausea And Vomiting, Rash and Nausea Only  . Tetanus-Diphth-Acell Pertussis Palpitations    Past Medical History:  Diagnosis Date  . Allergy   . Anxiety   . Arthritis   . Cataract   . Cystocele   . Fibroids   . Gastric reflux   . Generalized headaches   . H. pylori infection    8 years ago  . Heart murmur    Mild mitral regurgitation- pt denies this 07-02-16 at [facility]  . Hiatal hernia   . Hypertension   . Obstructive sleep apnea    uses CPAP  . Pain in limb   . Sleep apnea    uses CPAP  . Thyroid disease   . Tubular adenoma of colon   . Varicose veins     There is no history of radiation to the neck in childhood  Past Surgical History:  Procedure Laterality Date  . BREATH TEK H PYLORI N/A 05/14/2016   Procedure: BREATH TEK H PYLORI;  Surgeon: Jerene Bears, MD;  Location: [facility];  Service: Gastroenterology;  Laterality: N/A;  . COLONOSCOPY     Dr Lajoyce Corners  . ESOPHAGOGASTRODUODENOSCOPY     Dr Lajoyce Corners  . TONSILECTOMY, ADENOIDECTOMY, [place]    . UPPER GASTROINTESTINAL ENDOSCOPY      Family History  Problem Relation Age of Onset  . Throat cancer Father        worked at [employer]  . Hypothyroidism Sister   . Kidney disease Mother   . Heart failure Brother   . Hypertension Brother   . Heart failure Brother   . Hypertension Brother   . Colon cancer Neg Hx   . Esophageal cancer Neg Hx   . Rectal cancer Neg  Hx   . Stomach cancer Neg Hx   . Colon polyps Neg Hx     Social History:  reports that she has never smoked. She has never used smokeless tobacco. She reports that she does not drink alcohol and does not use drugs.   Review of Systems:     She has had sleep apnea  diagnosed in 2013 using CPAP She still tends to have some sleepiness in the afternoon  HYPERTENSION: Blood pressure readings as follows       BP Readings from Last 3 Encounters:  10/10/20 130/84  10/05/20 124/78  08/22/20 (!) 157/77   PREDIABETES:  Although she recently saw her PCP she has not discussed her lab results Glucose was 128, possibly fasting but A1c was still in prediabetic range at 6.1    Examination:   BP 130/84   Pulse 72   Ht 5\' 3"  (1.6 m)   Wt 240 lb 6.4 oz (109 kg)   SpO2 97%   BMI 42.58 kg/m          Assessment/Plan:  HYPOTHYROIDISM:   She is on liothyronine and levothyroxine Has stable thyroid levels on 125 mcg levothyroxine and 10 mcg of liothyronine  Subjectively doing well and she will continue the same regimen  Morbid obesity with prediabetes and A1c 6.1:  Discussed that if her fasting glucose was 128 she does have early diabetes and would need to consider consultation with dietitian Also recommended that she start water aerobics, likely she should be able to join [employer] sne[employer] program through her insurance  She will call her PCP for further management  Follow-up in 8 months  Elayne Snare 10/10/2020

## 2020-10-10 NOTE — Telephone Encounter (Signed)
I personally reviewed external labs 10/05/2020: HDL 50, LDL 101, total cholesterol 170, triglycerides 109 A1c 6.1% BUN 18, creatinine 0.84  Lipids have improved and overall well controlled compared to previous lipid profile testing in September 2020.  LDL is borderline elevated.  Will defer further management to primary care.

## 2020-10-12 ENCOUNTER — Telehealth: Payer: Self-pay

## 2020-10-12 NOTE — Telephone Encounter (Signed)
error 

## 2020-10-13 ENCOUNTER — Telehealth: Payer: Self-pay | Admitting: *Deleted

## 2020-10-13 NOTE — Telephone Encounter (Signed)
Called the patient and scheduled a new patient appt for 11/29 at 11:15 am with Dr Berline Lopes. Patient given the address and phone number for the clinic. Patient also given the policy for mask and visitors

## 2020-10-21 ENCOUNTER — Encounter: Payer: Self-pay | Admitting: Gynecologic Oncology

## 2020-10-21 NOTE — Progress Notes (Signed)
GYNECOLOGIC ONCOLOGY NEW PATIENT CONSULTATION   Patient Name: Sherry Bell  Patient Age: 74 y.o. Date of Service: 10/24/2020 Referring Provider: Dr. Tiana Loft   Primary Care Provider: Hulan Fess, MD Consulting Provider: Jeral Pinch, MD   Assessment/Plan:  Postmenopausal patient with a slowly enlarging fibroid uterus.  I reviewed with the patient her most recent pelvic ultrasound in comparison to her last imaging, which was in 2015.  Over about 6 and half years, she has had a growth of between 2 and 3 cm in 2 dimensions of both her uterus as well as the dominant fibroid.  Overall, this is slow-growing, but raises some concern in the setting of menopause.  Typically, during menopause, with the withdrawal of ovarian hormone production (notably estrogen), fibroids tend to decrease in size.  While my suspicion that this is a cancerous process is low, we discussed that the only way to definitively rule out precancer or cancer is to proceed with surgery.  I think an endometrial biopsy would be of very limited utility given that she is not having any postmenopausal bleeding, and an endometrial biopsy would sample only the lining of her uterus.  She and I also discussed other options (she asked about uterine artery embolization), but given the goal of determining whether her enlarging fibroids are due to cancer, I do not recommend a procedure such as embolization.  She also has significant pelvic organ prolapse, mostly anterior.  She has attempted treatment per our discussion before.  Does not sound like she is very symptomatic either from her fibroid uterus or her prolapse, but I think it is worth having her see a specialist to discuss surgical options for her prolapse.  If she were interested, it would be ideal to have a joint procedure.  She is amenable to me placing a referral, which will be done today.  If she opts for nonsurgical management, then my recommendation is for close imaging  surveillance, in the form of pelvic ultrasounds every 4-6 months.  If we establish over several ultrasounds that there is little or no growth, then we could space the frequency of this imaging out.  It also may be worth getting an MRI for better tissue characterization if she opts against surgery.  All the patient's questions were answered today.  We have made her an appointment for a phone visit in January.  I have asked her to call me sooner than that if she decides that she is ready to move forward with surgery, so that we can get this scheduled when convenient for her from a work standpoint.  A copy of this note was sent to the patient's referring provider.   60 minutes of total time was spent for this patient encounter, including preparation, face-to-face counseling with the patient and coordination of care, and documentation of the encounter.   Jeral Pinch, MD  Division of Gynecologic Oncology  Department of Obstetrics and Gynecology  [facility]  ___________________________________________  Chief Complaint: Chief Complaint  Patient presents with  . Enlarging fibroid    History of Present Illness:  Sherry Bell is a 74 y.o. y.o. female who is seen in consultation at the request of Dr. Murrell Redden for an evaluation of enlarging fibroid uterus.  Patient has a known history of a fibroid uterus, dating back to 2005.  Prior imaging is noted below.  She was previously referred to my partner in 2015 given some growth of her fibroids and menopause and at that time  recommendation was made for close imaging surveillance.  She was recently seen by her GYN for vulvar pruritus.  She was treated for a yeast infection with her symptoms now resolved, and underwent pelvic imaging for the first time since 2015.  Her most recent ultrasound earlier this month showed that her uterus had enlarged some since 2015 that seems to be related to several centimeter enlargement of her  dominant fibroid.  Patient denies any vaginal bleeding or discharge.  She reports regular bowel function.  She takes magnesium at night which helps keep her bowels regular.  She reports some urgency.  Upon questioning, she has been treated previously for pelvic organ prolapse (most recently saw Dr. Maryland Pink in 2016) and has performed Kegel exercises in the past as well as used a pessary.  She ultimately discontinued the pessary as she felt unable to reach down to remove and clean it.  She denies any changes in weight recently.  She reports a good appetite.  She has occasional nausea related to her thyroid medication but otherwise denies any nausea, emesis, early satiety.  She has some joint pain related to arthritis in her knees and back.  She gets short of breath with walking longer distances.  She is able to do a small number of stairs but is somewhat restricted mostly due to her knee but sometimes her breathing.  She has sleep apnea and uses a CPAP.  She is not received her Covid vaccines and had Covid in March.  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  Prior Imaging Pelvic ultrasound in 09/2020 at [facility]: Uterus measures 14.4 x 11.6 x 9.5 cm. Endometrial lining not able to be adequately seen. Multiple fibroids noted including a 3 x 2 x 3.6 right lateral fundal fibroid, a 2.8 x 2.3 x 2.8 cm right lateral fundal fibroid, an 8 x 8 x 7.5 cm midline fibroid, and a 4.3 x 3.3 x 3.5 cm posterior right fibroid.  Pelvic ultrasound in 04/2014 at [facility]: Uterus measures 11.8 x 10.6 x 7.5 cm, endometrium is poorly visualized. In the lower uterine segment portion, there is a small amount of fluid. 3 largest fibroids measured are a posterior left fibroid measuring 6.3 x 4.7 x 5.4 cm, and anterior fibroid measuring 4 x 3 x 3.8 cm, and a posterior fibroid measuring 4 x 3 x 2.9 cm.  CT urogram 02/2014: Uterus measures 11.1 x 8.9 cm with scattered punctate calcifications noted within. There  are no definitive uterine fibroids. No definitive fluid seen within the canal. No discrete adnexal lesions. No free fluid in the pelvis.  Pelvic ultrasound at [place] in 04/2004: Uterus measures 10.2 x 8.7 x 7.1 cm. Multiple fibroids noted, the largest measuring 4.2 x 4.4 x 4.5 cm.  PAST MEDICAL HISTORY:  Past Medical History:  Diagnosis Date  . Allergy   . Anxiety   . Arthritis   . Cataract   . Cystocele   . Fibroids   . Gastric reflux   . Generalized headaches   . H. pylori infection    8 years ago  . Heart murmur    Mild mitral regurgitation- pt denies this 07-02-16 at [facility]  . Hiatal hernia   . Hypertension   . Obstructive sleep apnea    uses CPAP  . Pain in limb   . Sleep apnea    uses CPAP  . Thyroid disease   . Tubular adenoma of colon   . Varicose veins  PAST SURGICAL HISTORY:  Past Surgical History:  Procedure Laterality Date  . BREATH TEK H PYLORI N/A 05/14/2016   Procedure: BREATH TEK H PYLORI;  Surgeon: Jerene Bears, MD;  Location: [facility];  Service: Gastroenterology;  Laterality: N/A;  . CERVICAL BIOPSY  W/ LOOP ELECTRODE EXCISION  08/2002  . CERVICAL POLYPECTOMY  2002  . COLONOSCOPY     Dr Lajoyce Corners  . ESOPHAGOGASTRODUODENOSCOPY     Dr Lajoyce Corners  . TONSILECTOMY, ADENOIDECTOMY, [place]    . UPPER GASTROINTESTINAL ENDOSCOPY      OB/GYN HISTORY:  OB History  Gravida Para Term Preterm AB Living  1 1          SAB TAB Ectopic Multiple Live Births               # Outcome Date GA Lbr Len/2nd Weight Sex Delivery Anes PTL Lv  1 Para             No LMP recorded. Patient is postmenopausal.  Age at menarche: 74 Age at menopause: 16 Hx of HRT: Eyes Hx of STDs: No Last pap: 08/2020 - negative Hx of abnormal pap smears: Denies  SCREENING STUDIES:  Last mammogram: 08/2020  Last colonoscopy: 2019 Last bone mineral density: 2012  MEDICATIONS: Outpatient Encounter Medications as of 10/24/2020  Medication Sig  . amLODipine  (NORVASC) 5 MG tablet Take 1 tablet (5 mg total) by mouth daily.  . cholecalciferol (VITAMIN D) 1000 UNITS tablet Take 7,000 Units by mouth daily.   Marland Kitchen gabapentin (NEURONTIN) 100 MG capsule Take 1 capsule (100 mg total) by mouth 2 (two) times daily.  Marland Kitchen liothyronine (CYTOMEL) 5 MCG tablet TAKE 2 TABLETS (10 MCG TOTAL) BY MOUTH DAILY.  . Magnesium 300 MG CAPS Take 1 capsule by mouth daily. Reported on 02/14/2016  . meloxicam (MOBIC) 15 MG tablet Take 15 mg by mouth daily as needed.   Marland Kitchen spironolactone (ALDACTONE) 25 MG tablet TAKE 1 TABLET BY MOUTH EVERY DAY IN THE MORNING  . SYNTHROID 125 MCG tablet TAKE 1 TABLET (125 MCG TOTAL) BY MOUTH DAILY BEFORE BREAKFAST.   No facility-administered encounter medications on file as of 10/24/2020.    ALLERGIES:  Allergies  Allergen Reactions  . Amoxicillin Nausea And Vomiting    Unknown  . Pneumococcal 13-Val Conj Vacc Other (See Comments)  . Codeine Nausea And Vomiting, Rash and Nausea Only  . Tetanus-Diphth-Acell Pertussis Palpitations     FAMILY HISTORY:  Family History  Problem Relation Age of Onset  . Throat cancer Father        worked at [employer]  . Hypothyroidism Sister   . Kidney disease Mother   . Heart failure Brother   . Hypertension Brother   . Heart failure Brother   . Hypertension Brother   . Colon cancer Neg Hx   . Esophageal cancer Neg Hx   . Rectal cancer Neg Hx   . Stomach cancer Neg Hx   . Colon polyps Neg Hx   . Uterine cancer Neg Hx   . Ovarian cancer Neg Hx      SOCIAL HISTORY:    Social Connections:   . Frequency of Communication with Friends and Family: Not on file  . Frequency of Social Gatherings with Friends and Family: Not on file  . Attends Religious Services: Not on file  . Active Member of Clubs or Organizations: Not on file  . Attends Archivist Meetings: Not on file  . Marital Status: Not on  file    REVIEW OF SYSTEMS:  Reports joint pain Denies appetite changes, fevers, chills,  fatigue, unexplained weight changes. Denies hearing loss, neck lumps or masses, mouth sores, ringing in ears or voice changes. Denies cough or wheezing.  Denies shortness of breath. Denies chest pain or palpitations. Denies leg swelling. Denies abdominal distention, pain, blood in stools, constipation, diarrhea, nausea, vomiting, or early satiety. Denies pain with intercourse, dysuria, frequency, hematuria or incontinence. Denies hot flashes, pelvic pain, vaginal bleeding or vaginal discharge.   Denies muscle pain/cramps. Denies itching, rash, or wounds. Denies dizziness, headaches, numbness or seizures. Denies swollen lymph nodes or glands, denies easy bruising or bleeding. Denies anxiety, depression, confusion, or decreased concentration.  Physical Exam:  Vital Signs for this encounter:  Blood pressure (!) 141/66, pulse 70, temperature (!) 97.1 F (36.2 C), temperature source Tympanic, resp. rate 20, height 5\' 3"  (1.6 m), weight 241 lb 6.4 oz (109.5 kg), SpO2 100 %. Body mass index is 42.76 kg/m. General: Alert, oriented, no acute distress.  HEENT: Normocephalic, atraumatic. Sclera anicteric.  Chest: Clear to auscultation bilaterally. No wheezes, rhonchi, or rales. Cardiovascular: Regular rate and rhythm, no murmurs, rubs, or gallops.  Abdomen: Obese. Normoactive bowel sounds. Soft, nondistended, nontender to palpation. No masses or hepatosplenomegaly appreciated. No palpable fluid wave.  Extremities: Grossly normal range of motion. Warm, well perfused. No edema bilaterally.  Significant bilateral varicose veins in her lower leg noted. Skin: No rashes or lesions.  Lymphatics: No inguinal adenopathy.  GU:  Normal external female genitalia. No lesions. No discharge or bleeding.  At rest, her anterior vagina is noted to protrude just past the hymen.  With Valsalva, she has stage III pelvic organ prolapse.             Bladder/urethra:  No lesions or masses.             Vagina: Mildly  atrophic vaginal mucosa.  No lesions             Cervix: Normal appearing, no lesions.             Uterus: Approximately 12-14 cm, mobile, no parametrial involvement or nodularity.  There is mobility from both sidewalls.             Adnexa: No masses appreciated.   LABORATORY AND RADIOLOGIC DATA:  Outside medical records were reviewed to synthesize the above history, along with the history and physical obtained during the visit.   Recent lab work notable for hemoglobin A1c of 6.1, CBC unremarkable BMP also unremarkable.  Creatinine 0.84.

## 2020-10-24 ENCOUNTER — Other Ambulatory Visit: Payer: Self-pay

## 2020-10-24 ENCOUNTER — Encounter: Payer: Self-pay | Admitting: Gynecologic Oncology

## 2020-10-24 ENCOUNTER — Inpatient Hospital Stay: Payer: Medicare Other | Attending: Gynecologic Oncology | Admitting: Gynecologic Oncology

## 2020-10-24 ENCOUNTER — Telehealth: Payer: Self-pay | Admitting: *Deleted

## 2020-10-24 VITALS — BP 141/66 | HR 70 | Temp 97.1°F | Resp 20 | Ht 63.0 in | Wt 241.4 lb

## 2020-10-24 DIAGNOSIS — D259 Leiomyoma of uterus, unspecified: Secondary | ICD-10-CM | POA: Insufficient documentation

## 2020-10-24 DIAGNOSIS — D219 Benign neoplasm of connective and other soft tissue, unspecified: Secondary | ICD-10-CM

## 2020-10-24 NOTE — Telephone Encounter (Signed)
Fax referral and records to [facility]

## 2020-10-24 NOTE — Patient Instructions (Signed)
It was a pleasure meeting you today.  Your recent ultrasound showed that there has been some growth in the size of your uterus and the largest fibroid.  The growth of the largest fibroid has probably caused the overall change in the size of your uterus.  In the last 6-1/2 years, the fibroid and uterus have grown about 2-1/2 cm or approximately 1 inch.  Overall, this is slow growth and my concern that there is a cancer is low.  However, fibroids are driven by hormones, and it is uncommon for [place] to see a fibroid or fibroids grow during menopause.  The only way to definitively rule out a precancer or cancerous process is to proceed with surgery, which would mean a hysterectomy (removing your uterus, cervix).  This could be done with or without removal of your fallopian tubes and ovaries, although given your age, I would recommend their removal.  You have pelvic organ prolapse (muscles of your pelvic floor are weakened which has let your pelvic organs, specifically her bladder, prolapse or fall into the vagina).  This is why you feel a bulge, especially when you are coughing or pushing.  I would like you to see a specialist who treats prolapse before we do surgery if you decide that you would like to move forward with getting surgery scheduled.  If you decide with the specialist to have surgery for your prolapse, it would make sense to do these both at the same time.  If you make a decision before the new year about surgery, please give my office a call at [phone].  Otherwise, we are scheduled for a phone visit in early January.

## 2020-10-28 ENCOUNTER — Other Ambulatory Visit: Payer: Self-pay | Admitting: Student

## 2020-10-28 DIAGNOSIS — I10 Essential (primary) hypertension: Secondary | ICD-10-CM

## 2020-10-31 ENCOUNTER — Telehealth: Payer: Self-pay | Admitting: *Deleted

## 2020-10-31 NOTE — Telephone Encounter (Signed)
Patient is scheduled to see Dr Jason Fila on 1/5 at 12:45 am

## 2020-11-08 ENCOUNTER — Other Ambulatory Visit: Payer: Self-pay | Admitting: Cardiology

## 2020-11-08 DIAGNOSIS — I10 Essential (primary) hypertension: Secondary | ICD-10-CM

## 2020-11-27 ENCOUNTER — Other Ambulatory Visit: Payer: Self-pay | Admitting: Student

## 2020-11-27 DIAGNOSIS — I10 Essential (primary) hypertension: Secondary | ICD-10-CM

## 2020-11-29 ENCOUNTER — Telehealth: Payer: Self-pay | Admitting: Gynecologic Oncology

## 2020-11-29 ENCOUNTER — Ambulatory Visit: Payer: Medicare Other | Admitting: Gynecologic Oncology

## 2020-11-29 DIAGNOSIS — D219 Benign neoplasm of connective and other soft tissue, unspecified: Secondary | ICD-10-CM

## 2020-11-29 NOTE — Telephone Encounter (Signed)
I called the patient to follow-up on decision about surgery.  She has rescheduled her visit to see the urologist until mid February.  She continues to be asymptomatic and at this time would like to hold off on consideration of surgery.  She will let me know in mid to late February, after her visit with [facility], which she is wanting to do.  If we are continuing doing with surveillance, we will plan to get an ultrasound scheduled for March or April.  Eugene Garnet MD Gynecologic Oncology

## 2020-12-27 DIAGNOSIS — N8111 Cystocele, midline: Secondary | ICD-10-CM | POA: Diagnosis not present

## 2020-12-27 DIAGNOSIS — R350 Frequency of micturition: Secondary | ICD-10-CM | POA: Diagnosis not present

## 2020-12-27 DIAGNOSIS — R351 Nocturia: Secondary | ICD-10-CM | POA: Diagnosis not present

## 2020-12-29 ENCOUNTER — Other Ambulatory Visit: Payer: Self-pay | Admitting: Student

## 2020-12-29 DIAGNOSIS — I10 Essential (primary) hypertension: Secondary | ICD-10-CM

## 2021-01-30 ENCOUNTER — Other Ambulatory Visit: Payer: Self-pay | Admitting: Student

## 2021-01-30 DIAGNOSIS — I10 Essential (primary) hypertension: Secondary | ICD-10-CM

## 2021-02-06 ENCOUNTER — Telehealth: Payer: Self-pay | Admitting: Oncology

## 2021-02-06 NOTE — Telephone Encounter (Signed)
Called Sherry Bell to see if she has made a decision about surgery.  She said she has seen urology but has not made a decision yet.  Reviewed plan for an ultrasound in March or April if she decides against surgery.  She verbalized agreement and will call when she has made a decision.

## 2021-02-17 NOTE — Telephone Encounter (Signed)
error 

## 2021-02-23 ENCOUNTER — Other Ambulatory Visit: Payer: Self-pay | Admitting: Student

## 2021-02-23 DIAGNOSIS — I10 Essential (primary) hypertension: Secondary | ICD-10-CM

## 2021-03-07 ENCOUNTER — Telehealth: Payer: Self-pay | Admitting: Oncology

## 2021-03-07 ENCOUNTER — Other Ambulatory Visit: Payer: Self-pay | Admitting: Gynecologic Oncology

## 2021-03-07 DIAGNOSIS — D289 Benign neoplasm of female genital organ, unspecified: Secondary | ICD-10-CM

## 2021-03-07 DIAGNOSIS — D219 Benign neoplasm of connective and other soft tissue, unspecified: Secondary | ICD-10-CM

## 2021-03-07 NOTE — Telephone Encounter (Signed)
Called Sherry Bell back and advised her that May is ok to have the ultrasound.  Notified her that it has been scheduled for 04/12/21 at 1:30 (arrival at 1:15, needs a full bladder) at [facility].  She verbalized understanding and agreement.

## 2021-03-07 NOTE — Telephone Encounter (Signed)
JAARS regarding her surgery decision.  She said she still has not decided and wants to "wait and see."  Reviewed the plan for an ultrasound in March or April and asked if we can go ahead and schedule it.  She is wondering if she can have it scheduled for May.  Advised her that I will check and call her back.

## 2021-03-18 ENCOUNTER — Other Ambulatory Visit: Payer: Self-pay | Admitting: Student

## 2021-03-18 DIAGNOSIS — I10 Essential (primary) hypertension: Secondary | ICD-10-CM

## 2021-03-31 DIAGNOSIS — S20151A Superficial foreign body of breast, right breast, initial encounter: Secondary | ICD-10-CM | POA: Diagnosis not present

## 2021-03-31 DIAGNOSIS — W57XXXA Bitten or stung by nonvenomous insect and other nonvenomous arthropods, initial encounter: Secondary | ICD-10-CM | POA: Diagnosis not present

## 2021-03-31 DIAGNOSIS — S20161A Insect bite (nonvenomous) of breast, right breast, initial encounter: Secondary | ICD-10-CM | POA: Diagnosis not present

## 2021-04-03 ENCOUNTER — Other Ambulatory Visit: Payer: Self-pay | Admitting: Endocrinology

## 2021-04-04 NOTE — Progress Notes (Signed)
Primary Physician/Referring:  Hulan Fess, MD  Patient ID: Sherry Bell, female    DOB: 13-May-1946, 75 y.o.   MRN: [id number]  Chief Complaint  Patient presents with  . Hypertension  . Hyperlipidemia    6 months   . Follow-up   HPI:    Sherry Bell  is a 75 y.o. Caucasian female patient with history of HTN, very mild HLD with elevated LDL particle number, obesity, and pre-diabetes. Patient also has sleep apnea and has been using CPAP.   Patient presents for 68-monthfollow-up of hypertension and hyperlipidemia.  Patient reports home blood pressure readings remain elevated averaging 145/80-85 mmHg.  She has been taking amlodipine but reports it is making her hair fall out.  Patient's primary concern today is fatigue over the last several months, she notes that she has been unable to tolerate CPAP and is working with Dr. OElenore Rotato improve CPAP tolerability.  Patient has not been sleeping well as a result of difficulty with CPAP.  She denies chest pain, palpitations, syncope, near syncope, dizziness.  She is relatively inactive, but does cook and clean around her house without issue.  Of note she is currently not taking previously prescribed simvastatin.  Today patient requests that we discontinue amlodipine and she be switched to diltiazem.  Past Medical History:  Diagnosis Date  . Allergy   . Anxiety   . Arthritis   . Cataract   . Cystocele   . Fibroids   . Gastric reflux   . Generalized headaches   . H. pylori infection    8 years ago  . Heart murmur    Mild mitral regurgitation- pt denies this 07-02-16 at her [facility]  . Hiatal hernia   . Hypertension   . Obstructive sleep apnea    uses CPAP  . Pain in limb   . Sleep apnea    uses CPAP  . Thyroid disease   . Tubular adenoma of colon   . Varicose veins    Past Surgical History:  Procedure Laterality Date  . BREATH TEK H PYLORI N/A 05/14/2016   Procedure: BREATH TEK H PYLORI;  Surgeon: JJerene Bears MD;  Location[facility];  Service: Gastroenterology;  Laterality: N/A;  . COLONOSCOPY     Dr OLajoyce Corners . ESOPHAGOGASTRODUODENOSCOPY     Dr OLajoyce Corners . TONSILECTOMY, ADENOIDECTOMY, [facility]   . UPPER GASTROINTESTINAL ENDOSCOPY     Family History  Problem Relation Age of Onset  . Throat cancer Father        worked at [employer] . Hypothyroidism Sister   . Kidney disease Mother   . Heart failure Brother   . Hypertension Brother   . Heart failure Brother   . Hypertension Brother   . Colon cancer Neg Hx   . Esophageal cancer Neg Hx   . Rectal cancer Neg Hx   . Stomach cancer Neg Hx   . Colon polyps Neg Hx   . Uterine cancer Neg Hx   . Ovarian cancer Neg Hx    Social History   Tobacco Use  . Smoking status: Never Smoker  . Smokeless tobacco: Never Used  Substance Use Topics  . Alcohol use: No   Marital Status: Single  ROS  Review of Systems  Constitutional: Positive for malaise/fatigue. Negative for weight gain.  Cardiovascular: Negative for chest pain, claudication, dyspnea on exertion, leg swelling, near-syncope, orthopnea, palpitations, paroxysmal nocturnal dyspnea and syncope.  Respiratory: Negative for  shortness of breath.   Hematologic/Lymphatic: Does not bruise/bleed easily.  Musculoskeletal: Positive for joint pain.  Gastrointestinal: Negative for melena.  Neurological: Negative for dizziness and weakness.   Objective   Vitals with BMI 04/05/2021 10/24/2020 10/10/2020  Height _0  _1  _2   Weight 242 lbs 241 lbs 6 oz 240 lbs 6 oz  BMI 42.88 08.14 48.1  Systolic 856 314 970  Diastolic 74 66 84  Pulse 89 70 72    Physical Exam Constitutional:      General: She is not in acute distress.    Appearance: She is well-developed. She is obese.     Comments: Morbidly obese  Neck:     Thyroid: No thyromegaly.     Vascular: No carotid bruit.     Comments: Short neck and difficult to evaluate JVP Cardiovascular:     Rate and Rhythm: Normal rate and regular  rhythm.     Pulses:          Carotid pulses are 2+ on the right side and 2+ on the left side.      Dorsalis pedis pulses are 2+ on the right side and 2+ on the left side.       Posterior tibial pulses are 2+ on the right side and 2+ on the left side.     Heart sounds: Normal heart sounds. No murmur heard. No gallop.      Comments: Femoral and popliteal pulse difficult to feel due to patient's body habitus.   Bilateral varicose veins noted. No edema. No JVD.  Pulmonary:     Effort: Pulmonary effort is normal.     Breath sounds: Normal breath sounds.  Abdominal:     Comments: Obese. Pannus present  Musculoskeletal:        General: Tenderness (bilateral ankles) present.     Right lower leg: No edema.     Left lower leg: No edema.  Skin:    General: Skin is warm and dry.     Laboratory examination:   No results for input(s): NA, K, CL, CO2, GLUCOSE, BUN, CREATININE, CALCIUM, GFRNONAA, GFRAA in the last 8760 hours. CMP Latest Ref Rng & Units 07/27/2018 06/22/2017 04/17/2016  Glucose 70 - 99 mg/dL 91 151(H) 162(H)  BUN 8 - 23 mg/dL _3 Creatinine 0.44 - 1.00 mg/dL 0.76 0.90 0.91  Sodium 135 - 145 mmol/L 140 134(L) 136  Potassium 3.5 - 5.1 mmol/L 4.4 4.0 4.5  Chloride 98 - 111 mmol/L 103 100(L) 103  CO2 22 - 32 mmol/L _4 Calcium 8.9 - 10.3 mg/dL 8.8(L) 8.8(L) 9.1  Total Protein 6.5 - 8.1 g/dL 7.9 7.3 7.6  Total Bilirubin 0.3 - 1.2 mg/dL 0.9 1.5(H) 0.8  Alkaline Phos 38 - 126 U/L 45 43 49  AST 15 - 41 U/L _5 ALT 0 - 44 U/L _6 CBC Latest Ref Rng & Units 07/27/2018 06/22/2017 04/17/2016  WBC 4.0 - 10.5 K/uL 7.1 23.3(H) 13.5(H)  Hemoglobin 12.0 - 15.0 g/dL 13.0 12.1 13.2  Hematocrit 36.0 - 46.0 % 39.5 36.6 38.6  Platelets 150 - 400 K/uL 193 182 199   Lipid Panel  No results found for: CHOL, TRIG, HDL, CHOLHDL, VLDL, LDLCALC, LDLDIRECT   HEMOGLOBIN A1C No results found for: HGBA1C, MPG   TSH Recent Labs    09/26/20 1000  TSH 0.52   External  Labs: 10/05/2020: HDL 50, LDL 101, total cholesterol 170, triglycerides 109 A1c 6.1%  BUN 18, creatinine 0.84  07/28/2019:  Total cholesterol 216, triglycerides 103, HDL 54, LDL 142.  Non-HDL cholesterol 163.  Serum glucose 110 mg, frequency 5.8%, BUN 22, creatinine 0.87, eGFR greater than 60 him up.  Potassium 5.2.  CMP otherwise normal.  CBC normal  TSH 1.260 03/21/2020   Medications and allergies   Allergies  Allergen Reactions  . Amlodipine     Hair loss.   . Amoxicillin Nausea And Vomiting    Unknown  . Pneumococcal 13-Val Conj Vacc Other (See Comments)  . Prevnar 13 [Pneumococcal 13-Val Conj Vacc] Other (See Comments)  . Tetanus-Diphth-Acell Pertussis Other (See Comments)  . Codeine Nausea And Vomiting, Rash and Nausea Only  . Tetanus-Diphth-Acell Pertussis Palpitations    Current Outpatient Medications on File Prior to Visit  Medication Sig Dispense Refill  . cholecalciferol (VITAMIN D) 1000 UNITS tablet Take 7,000 Units by mouth daily.     Marland Kitchen liothyronine (CYTOMEL) 5 MCG tablet TAKE 2 TABLETS (10 MCG TOTAL) BY MOUTH DAILY. 180 tablet 1  . Magnesium 300 MG CAPS Take 1 capsule by mouth daily. Reported on 02/14/2016    . meloxicam (MOBIC) 15 MG tablet Take 15 mg by mouth daily as needed.     Marland Kitchen SYNTHROID 125 MCG tablet TAKE 1 TABLET (125 MCG TOTAL) BY MOUTH DAILY BEFORE BREAKFAST. 90 tablet 1   No current facility-administered medications on file prior to visit.    Radiology:  No results found.  Cardiac Studies:   SLEEP STUDY 03/24/12 (POSITIVE) follows Dr. Maxwell Caul and on CPAP and compliant.  Treadmill exercise stress test 03/25/2017: Indication: SoB and HTN Resting EKG demonstrates NSR. The patient exercised according to Bruce Protocol, Total time recorded 5:29 min achieving max heart rate of 146 which was 96% of THR for age and 7.05 METS of work. Stress terminated due to dyspnea, fatigue and THR (>85% MPHR)/MPHR met. Normal BP response. There was no ST-T changes of  ischemia with exercise stress test. There were no significant arrhythmias. Normal BP response. Rec: No e/o ischemia by GXT. Exercise tolerence is markedly reduced due to aerobic intolerence. Continue Preventive therapy.  Echocardiogram 04/09/2017: Left ventricle cavity is normal in size. Moderate concentric hypertrophy of the left ventricle. Normal global wall motion. Doppler evidence of grade II (pseudonormal) diastolic dysfunction, elevated LAP. Calculated EF 56%. Left atrial cavity is mildly dilated at 4.1 cm. Mild (Grade I) mitral regurgitation. Mild tricuspid regurgitation. No evidence of pulmonary hypertension. Compared to the study done on 6/17/23rd in, grade 2 diastolic dysfunction, MR and TR new.  EKG   EKG 04/05/2021: Sinus rhythm at a rate of 75 bpm.  Normal axis.  Nonspecific T wave abnormality.  Low voltage complexes, consider pulmonary disease pattern.  Compared to EKG 08/22/2020, no PRWP.  EKG 08/31/2019: Normal sinus rhythm at rate of 72 bpm, normal axis.  No evidence of ischemia, normal EKG. No significant change from EKG 12/17/2017  Assessment     ICD-10-CM   1. Essential hypertension  I10 EKG [id number]    Basic metabolic panel  2. Hypercholesteremia  E78.00   3. Essential (primary) hypertension  I10 spironolactone (ALDACTONE) 25 MG tablet    Meds ordered this encounter  Medications  . spironolactone (ALDACTONE) 25 MG tablet    Sig: TAKE 1 TABLET BY MOUTH EVERY DAY IN THE MORNING    Dispense:  90 tablet    Refill:  3  . losartan (COZAAR) 25 MG tablet    Sig: Take 1 tablet (25 mg total) by mouth  daily.    Dispense:  30 tablet    Refill:  3   Medications Discontinued During This Encounter  Medication Reason  . gabapentin (NEURONTIN) 100 MG capsule Error  . amLODipine (NORVASC) 5 MG tablet Side effect (s)  . spironolactone (ALDACTONE) 25 MG tablet Reorder   Recommendations:   KERSTYN CORYELL is a 75 y.o. Caucasian female patient with history of HTN, very mild HLD  with elevated LDL particle number, obesity, and pre-diabetes. Patient also has sleep apnea and has been using CPAP.   Patient presents for 67-monthfollow-up of hypertension and hyperlipidemia.  I personally reviewed external labs, lipid control has improved overall compared to prior testing in September 2020, however LDL remains borderline elevated.  Discussed at length with patient regarding benefit of statin therapy, however patient continues to prefer to focus on diet and lifestyle modifications rather than initiation of statin therapy.  For further management of hyperlipidemia to primary care.  In regard to hypertension, blood pressure remains uncontrolled.  Inpatient insists that amlodipine is causing her hair loss.  We will discontinue amlodipine and switch patient to losartan with repeat BMP in 1 week.  Regard to patient's complaint of fatigue, suspect this is related to poor sleep advised her to follow-up with Dr. OElenore Rotaregarding management of CPAP and sleep apnea.  Patient will continue to monitor blood pressure on a regular basis at home and bring with her written log to her next visit.  Follow-up in 8 weeks, sooner if needed, for hypertension.   CAlethia Berthold PA-C 04/05/2021, 12:44 PM Office: [phone]

## 2021-04-05 ENCOUNTER — Other Ambulatory Visit: Payer: Self-pay

## 2021-04-05 ENCOUNTER — Encounter: Payer: Self-pay | Admitting: Student

## 2021-04-05 ENCOUNTER — Ambulatory Visit: Payer: Medicare Other | Admitting: Student

## 2021-04-05 VITALS — BP 145/74 | HR 89 | Temp 98.6°F | Ht 63.0 in | Wt 242.0 lb

## 2021-04-05 DIAGNOSIS — E7800 Pure hypercholesterolemia, unspecified: Secondary | ICD-10-CM

## 2021-04-05 DIAGNOSIS — I10 Essential (primary) hypertension: Secondary | ICD-10-CM | POA: Diagnosis not present

## 2021-04-05 MED ORDER — LOSARTAN POTASSIUM 25 MG PO TABS: 25.0000 mg | ORAL_TABLET | Freq: Every day | ORAL | 3 refills | Status: DC

## 2021-04-05 MED ORDER — SPIRONOLACTONE 25 MG PO TABS: ORAL_TABLET | ORAL | 3 refills | Status: DC

## 2021-04-12 ENCOUNTER — Ambulatory Visit (HOSPITAL_COMMUNITY): Payer: Medicare Other

## 2021-04-14 ENCOUNTER — Telehealth: Payer: Self-pay

## 2021-04-14 MED ORDER — AMLODIPINE BESYLATE 5 MG PO TABS: 5.0000 mg | ORAL_TABLET | Freq: Every day | ORAL | 3 refills | Status: DC

## 2021-04-14 NOTE — Telephone Encounter (Signed)
Patient called and said that since taking losartan she has been having fluttering she wants to stop the losartan  and restart her amlodipine,  I spoke with Blenda Bridegroom and he said it was ok for the patient to stop losartan and restart amlodipine and see if fluttering gets better over the weekend.

## 2021-04-17 NOTE — Telephone Encounter (Signed)
That is fine with me.  Thank you! 

## 2021-04-18 ENCOUNTER — Ambulatory Visit (HOSPITAL_COMMUNITY)
Admission: RE | Admit: 2021-04-18 | Discharge: 2021-04-18 | Disposition: A | Discharge status: Home / Self Care | Payer: Medicare Other | Source: Ambulatory Visit | Attending: Gynecologic Oncology | Admitting: Gynecologic Oncology

## 2021-04-18 ENCOUNTER — Other Ambulatory Visit: Payer: Self-pay

## 2021-04-18 DIAGNOSIS — D259 Leiomyoma of uterus, unspecified: Secondary | ICD-10-CM | POA: Diagnosis not present

## 2021-04-18 DIAGNOSIS — D289 Benign neoplasm of female genital organ, unspecified: Secondary | ICD-10-CM | POA: Insufficient documentation

## 2021-04-18 DIAGNOSIS — N888 Other specified noninflammatory disorders of cervix uteri: Secondary | ICD-10-CM | POA: Diagnosis not present

## 2021-04-18 DIAGNOSIS — D219 Benign neoplasm of connective and other soft tissue, unspecified: Secondary | ICD-10-CM | POA: Diagnosis not present

## 2021-04-18 DIAGNOSIS — Z780 Asymptomatic menopausal state: Secondary | ICD-10-CM | POA: Diagnosis not present

## 2021-04-18 DIAGNOSIS — R9389 Abnormal findings on diagnostic imaging of other specified body structures: Secondary | ICD-10-CM | POA: Diagnosis not present

## 2021-04-19 ENCOUNTER — Telehealth: Payer: Self-pay | Admitting: Gynecologic Oncology

## 2021-04-19 NOTE — Telephone Encounter (Signed)
I called and spoke with the patient about her recent ultrasound. I don't appreciate any significant growth in the overall size of her uterus or the dominant fibroid since ultrasound in November. She continues to be relatively asymptomatic. She continues to deny having any PMB. There is a comment that a portion of her endometrium appears thickened. On my review of the images, I measure the endometrial lining as less than calipers.  We discussed treatment options. The patient still is wishing to avoid surgery. We discussed [place], which could cause some shrinkage of her uterus but could come with some side effects such as pain in the setting of causing degeneration. In someone who is not having a lot of symptoms, I worry about a [place] making her symptomatic.  Given questionable thickening of lining (not previously seen on outside imaging due to distortion by fibroids), we discussed 3 options. One would be close interval imaging. We could also attempt an EMB in clinic although I am suspicious this will be challenging given distortion of cavity with fibroids. Lastly, we could get an MRI.  The patient would like to think about these options and will call back with her decision. All questions answered.  Jeral Pinch MD Gynecologic Oncology

## 2021-05-12 ENCOUNTER — Other Ambulatory Visit: Payer: Self-pay

## 2021-05-12 ENCOUNTER — Other Ambulatory Visit (INDEPENDENT_AMBULATORY_CARE_PROVIDER_SITE_OTHER): Payer: Medicare Other

## 2021-05-12 DIAGNOSIS — E038 Other specified hypothyroidism: Secondary | ICD-10-CM | POA: Diagnosis not present

## 2021-05-12 LAB — T3, FREE: T3, Free: 2.9 pg/mL (ref 2.3–4.2)

## 2021-05-12 LAB — T4, FREE: Free T4: 1.04 ng/dL (ref 0.60–1.60)

## 2021-05-12 LAB — TSH: TSH: 0.62 u[IU]/mL (ref 0.35–4.50)

## 2021-05-16 ENCOUNTER — Other Ambulatory Visit: Payer: Self-pay

## 2021-05-16 ENCOUNTER — Ambulatory Visit (INDEPENDENT_AMBULATORY_CARE_PROVIDER_SITE_OTHER): Payer: Medicare Other | Admitting: Endocrinology

## 2021-05-16 ENCOUNTER — Encounter: Payer: Self-pay | Admitting: Endocrinology

## 2021-05-16 VITALS — BP 142/68 | HR 68 | Ht 63.0 in | Wt 238.2 lb

## 2021-05-16 DIAGNOSIS — E038 Other specified hypothyroidism: Secondary | ICD-10-CM | POA: Diagnosis not present

## 2021-05-16 DIAGNOSIS — R110 Nausea: Secondary | ICD-10-CM

## 2021-05-16 MED ORDER — EUTHYROX 125 MCG PO TABS: 125.0000 ug | ORAL_TABLET | Freq: Every day | ORAL | 1 refills | Status: DC

## 2021-05-16 NOTE — Patient Instructions (Signed)
Liothyronine 1 in am and 1 before dinner  Take thyroid pill 15  min before eating

## 2021-05-16 NOTE — Progress Notes (Signed)
Patient ID: Sherry Bell, female   DOB: 01/17/1946, 75 y.o.   MRN: [id number]           Reason for Appointment: Follow-up of hypothyroidism     History of Present Illness:    HYPOTHYROIDISM:  This was diagnosed probably in the 1990s when the patient had gone to her physician because of weight gain. She does not think she had any unusual fatigue at that time She was found to be hypothyroid She does not think she felt any different with taking the thyroid supplements but her weight stabilized.  Her previous endocrinologist put her on combination of Synthroid and Cytomel 25 g, 1/2 tablet, this was prescribed twice a day  Subsequently she had been taking the Cytomel only in the morning since she would forget the evening dose Prior to her visit here she has been on 25 mcg of levothyroxine, using half tablet twice daily  Recent history:  On her initial consultation her liothyronine was switched to 10 mcg instead of 12.5 for better compliance Also levothyroxine had been progressively increased until 1/21  She came in early today because of complaints of fatigue This has been going on for some time However her main complaint today in the office is nausea in the mornings  She says she gets nauseated for about an hour and this starts after taking her thyroid pills. Usually taking her tablets an hour before breakfast  She is currently taking 125 mcg of brand-name SYNTHROID along with 10 mcg of liothyronine  Her weight is slightly lower compared to her last visit   Wt Readings from Last 3 Encounters:  05/16/21 238 lb 3.2 oz (108 kg)  04/05/21 242 lb (109.8 kg)  10/24/20 241 lb 6.4 oz (109.5 kg)    TSH is consistently normal, again less than 1.0 with slightly variable levels of T3 and T4  Lab Results  Component Value Date   FREET4 1.04 05/12/2021   FREET4 0.96 09/26/2020   FREET4 0.92 03/21/2020   TSH 0.62 05/12/2021   TSH 0.52 09/26/2020   TSH 1.26 03/21/2020   Lab  Results  Component Value Date   T3FREE 2.9 05/12/2021   T3FREE 3.3 09/26/2020   T3FREE 3.9 03/21/2020    THYROID cyst:  The patient's thyroid nodule was first discovered in early 2016 soon after she had a respiratory infection in 11/2014 She had felt a swelling on the left side of her neck and was seen by her PCP for evaluation in 3/16 She did not have any swelling prior to that.  She does not feel that her neck swelling has increased in size and it may have moved more towards the center of the neck  The thyroid ultrasound showed the following Dominant simple appearing cyst occupies much of the left lobe and measures approximately 5.2 x 5.4 x 4.6 cm.  This cyst does not have any complex features or mural nodularity. On review of her previous records she had a predominantly cystic lesion in the upper pole of her thyroid measuring 1.7 cm in 2003 This was biopsied in 2002 and was benign Also she had a subcentimeter cyst in the lower left pole of the left side in 2003  She had a recurrence of the cyst in 2017 with local pressure symptoms Aspiration of this cyst was successfully done in June 2017 by Dr. Loanne Drilling and about 100 mL of fluid removed which was negative for cytology  No recurrence of the cyst since then   Allergies  as of 05/16/2021       Reactions   Amlodipine    Hair loss.    Amoxicillin Nausea And Vomiting   Unknown   Pneumococcal 13-val Conj Vacc Other (See Comments)   Prevnar 13 [pneumococcal 13-val Conj Vacc] Other (See Comments)   Tetanus-diphth-acell Pertussis Other (See Comments)   Codeine Nausea And Vomiting, Rash, Nausea Only   Tetanus-diphth-acell Pertussis Palpitations        Medication List        Accurate as of May 16, 2021 11:08 AM. If you have any questions, ask your nurse or doctor.          amLODipine 5 MG tablet Commonly known as: NORVASC Take 1 tablet (5 mg total) by mouth daily.   cholecalciferol 1000 units tablet Commonly known as:  VITAMIN D Take 7,000 Units by mouth daily.   liothyronine 5 MCG tablet Commonly known as: CYTOMEL TAKE 2 TABLETS (10 MCG TOTAL) BY MOUTH DAILY.   Magnesium 300 MG Caps Take 1 capsule by mouth daily. Reported on 02/14/2016   meloxicam 15 MG tablet Commonly known as: MOBIC Take 15 mg by mouth daily as needed.   spironolactone 25 MG tablet Commonly known as: ALDACTONE TAKE 1 TABLET BY MOUTH EVERY DAY IN THE MORNING   Synthroid 125 MCG tablet Generic drug: levothyroxine TAKE 1 TABLET (125 MCG TOTAL) BY MOUTH DAILY BEFORE BREAKFAST.        Allergies:  Allergies  Allergen Reactions   Amlodipine     Hair loss.    Amoxicillin Nausea And Vomiting    Unknown   Pneumococcal 13-Val Conj Vacc Other (See Comments)   Prevnar 13 [Pneumococcal 13-Val Conj Vacc] Other (See Comments)   Tetanus-Diphth-Acell Pertussis Other (See Comments)   Codeine Nausea And Vomiting, Rash and Nausea Only   Tetanus-Diphth-Acell Pertussis Palpitations    Past Medical History:  Diagnosis Date   Allergy    Anxiety    Arthritis    Cataract    Cystocele    Fibroids    Gastric reflux    Generalized headaches    H. pylori infection    8 years ago   Heart murmur    Mild mitral regurgitation- pt denies this 07-02-16 at [facility]   Hiatal hernia    Hypertension    Obstructive sleep apnea    uses CPAP   Pain in limb    Sleep apnea    uses CPAP   Thyroid disease    Tubular adenoma of colon    Varicose veins     There is no history of radiation to the neck in childhood  Past Surgical History:  Procedure Laterality Date   BREATH TEK H PYLORI N/A 05/14/2016   Procedure: BREATH TEK Kandis Ban;  Surgeon: Jerene Bears, MD;  Location: [facility];  Service: Gastroenterology;  Laterality: N/A;   COLONOSCOPY     Dr Lajoyce Corners   ESOPHAGOGASTRODUODENOSCOPY     Dr Thomes Dinning, ADENOIDECTOMY, BILATERAL MYRINGOTOMY AND TUBES     UPPER GASTROINTESTINAL ENDOSCOPY      Family History  Problem Relation Age of  Onset   Throat cancer Father        worked at [employer]   Hypothyroidism Sister    Kidney disease Mother    Heart failure Brother    Hypertension Brother    Heart failure Brother    Hypertension Brother    Colon cancer Neg Hx    Esophageal cancer Neg Hx  Rectal cancer Neg Hx    Stomach cancer Neg Hx    Colon polyps Neg Hx    Uterine cancer Neg Hx    Ovarian cancer Neg Hx     Social History:  reports that she has never smoked. She has never used smokeless tobacco. She reports that she does not drink alcohol and does not use drugs.   Review of Systems:     She has had sleep apnea diagnosed in 2013 using CPAP She still tends to have some sleepiness in the afternoon  HYPERTENSION: Taking amlodipine from cardiologist Blood pressure readings as follows       BP Readings from Last 3 Encounters:  05/16/21 (!) 142/68  04/05/21 (!) 145/74  10/24/20 (!) 141/66   She is asking about getting a new PCP    Examination:   BP (!) 142/68 (BP Location: Left Arm, Patient Position: Sitting)   Pulse 68   Ht 5\' 3"  (1.6 m)   Wt 238 lb 3.2 oz (108 kg)   SpO2 94%   BMI 42.20 kg/m          Assessment/Plan:  HYPOTHYROIDISM, longstanding:   She is on liothyronine 10 mcg and Synthroid 125 mcg  Not clear what her fatigue is from any major related to her sleep apnea She also has not been able to see her PCP recently  Unclear why she is having nausea now with the same regimen of Synthroid and liothyronine that she has been taking for the last few years  For now she can try to take liothyronine 5 mcg twice a day instead of all in the morning Also she will be given a trial of Euthyrox instead of Synthroid  She will also need to discuss her fatigue with PCP as well as review her management of sleep apnea  Given names of PCPs to establish with  Elayne Snare 05/16/2021

## 2021-05-31 ENCOUNTER — Ambulatory Visit: Payer: Medicare Other | Admitting: Student

## 2021-05-31 ENCOUNTER — Ambulatory Visit: Payer: Medicare Other | Admitting: Cardiology

## 2021-06-05 ENCOUNTER — Other Ambulatory Visit: Payer: Medicare Other

## 2021-06-12 ENCOUNTER — Ambulatory Visit: Payer: Medicare Other | Admitting: Endocrinology

## 2021-06-19 ENCOUNTER — Encounter: Payer: Self-pay | Admitting: Internal Medicine

## 2021-06-26 ENCOUNTER — Ambulatory Visit: Payer: Medicare Other | Admitting: Cardiology

## 2021-06-26 ENCOUNTER — Other Ambulatory Visit: Payer: Self-pay

## 2021-06-26 ENCOUNTER — Encounter: Payer: Self-pay | Admitting: Cardiology

## 2021-06-26 ENCOUNTER — Inpatient Hospital Stay: Payer: Medicare Other

## 2021-06-26 VITALS — BP 140/69 | HR 76 | Temp 98.5°F | Resp 17 | Ht 63.0 in | Wt 241.0 lb

## 2021-06-26 DIAGNOSIS — R0609 Other forms of dyspnea: Secondary | ICD-10-CM

## 2021-06-26 DIAGNOSIS — R002 Palpitations: Secondary | ICD-10-CM | POA: Insufficient documentation

## 2021-06-26 DIAGNOSIS — Z6841 Body Mass Index (BMI) 40.0 and over, adult: Secondary | ICD-10-CM | POA: Diagnosis not present

## 2021-06-26 DIAGNOSIS — I10 Essential (primary) hypertension: Secondary | ICD-10-CM | POA: Diagnosis not present

## 2021-06-26 DIAGNOSIS — R0600 Dyspnea, unspecified: Secondary | ICD-10-CM

## 2021-06-26 DIAGNOSIS — Z9989 Dependence on other enabling machines and devices: Secondary | ICD-10-CM | POA: Diagnosis not present

## 2021-06-26 DIAGNOSIS — G4733 Obstructive sleep apnea (adult) (pediatric): Secondary | ICD-10-CM

## 2021-06-26 NOTE — Progress Notes (Signed)
Primary Physician/Referring:  Hulan Fess, MD  Patient ID: Sherry Bell, female    DOB: 23-Jul-1946, 75 y.o.   MRN: [id number]  Chief Complaint  Patient presents with   Hypertension    8 weeks   Palpitations   HPI:    Sherry Bell  is a 75 y.o. Caucasian female patient with history of HTN, very mild HLD with elevated LDL particle number, obesity, and pre-diabetes, obstructive sleep apnea and has been using CPAP.  Patient was seen by [place] about 6 weeks ago, made an appointment to see [place] in view of palpitations and dyspnea on exertion.  Patient has been having 20 to 30 minutes of palpitations with or without exertion activity and has been concerned whether this is atrial fibrillation.  Episodes are random.  No clear-cut relationship to exercise, diet or sleep.     Past Medical History:  Diagnosis Date   Allergy    Anxiety    Arthritis    Cataract    Cystocele    Fibroids    Gastric reflux    Generalized headaches    H. pylori infection    8 years ago   Heart murmur    Mild mitral regurgitation- pt denies this 07-02-16 at her [facility]   Hiatal hernia    Hypertension    Obstructive sleep apnea    uses CPAP   Pain in limb    Sleep apnea    uses CPAP   Thyroid disease    Tubular adenoma of colon    Varicose veins    Past Surgical History:  Procedure Laterality Date   BREATH TEK H PYLORI N/A 05/14/2016   Procedure: BREATH TEK Kandis Ban;  Surgeon: Jerene Bears, MD;  Location: [facility];  Service: Gastroenterology;  Laterality: N/A;   COLONOSCOPY     Dr Lajoyce Corners   ESOPHAGOGASTRODUODENOSCOPY     Dr Lajoyce Corners   TONSILECTOMY, ADENOIDECTOMY, BILATERAL MYRINGOTOMY AND TUBES     UPPER GASTROINTESTINAL ENDOSCOPY     Family History  Problem Relation Age of Onset   Kidney disease Mother    Throat cancer Father        worked at [employer]   Hypothyroidism Sister    Atrial fibrillation Sister 63   Heart failure Brother    Hypertension Brother    Heart failure Brother    Hypertension  Brother    Colon cancer Neg Hx    Esophageal cancer Neg Hx    Rectal cancer Neg Hx    Stomach cancer Neg Hx    Colon polyps Neg Hx    Uterine cancer Neg Hx    Ovarian cancer Neg Hx    Social History   Tobacco Use   Smoking status: Never   Smokeless tobacco: Never  Substance Use Topics   Alcohol use: No   Marital Status: Single  ROS  Review of Systems  Constitutional: Positive for malaise/fatigue.  Cardiovascular:  Positive for dyspnea on exertion and palpitations. Negative for chest pain and leg swelling.  Musculoskeletal:  Positive for arthritis.  Gastrointestinal:  Negative for melena.  Objective   Vitals with BMI 06/26/2021 06/26/2021 06/26/2021  Height - - 5' 3"  Weight - - 241 lbs  BMI - - 01.7  Systolic 510 258 527  Diastolic 69 66 89  Pulse 76 75 82    Physical Exam Constitutional:      General: She is not in acute distress.    Appearance: She is well-developed. She is obese.  Comments: Morbidly obese  Neck:     Thyroid: No thyromegaly.     Vascular: No carotid bruit.     Comments: Short neck and difficult to evaluate JVP Cardiovascular:     Rate and Rhythm: Normal rate and regular rhythm.     Pulses:          Carotid pulses are 2+ on the right side and 2+ on the left side.      Dorsalis pedis pulses are 2+ on the right side and 2+ on the left side.       Posterior tibial pulses are 2+ on the right side and 2+ on the left side.     Heart sounds: Normal heart sounds. No murmur heard.   No gallop.     Comments: Femoral and popliteal pulse difficult to feel due to patient's body habitus.   Bilateral varicose veins noted. No edema. No JVD.  Pulmonary:     Effort: Pulmonary effort is normal.     Breath sounds: Normal breath sounds.  Abdominal:     General: Bowel sounds are normal.     Palpations: Abdomen is soft.     Comments: Obese. Pannus present  Musculoskeletal:     Right lower leg: No edema.     Left lower leg: No edema.  Skin:    General: Skin is  warm and dry.    Laboratory examination:   TSH Recent Labs    09/26/20 1000 05/12/21 0852  TSH 0.52 0.62   External Labs: 10/05/2020: HDL 50, LDL 101, total cholesterol 170, triglycerides 109 A1c 6.1% BUN 18, creatinine 0.84  07/28/2019:  Total cholesterol 216, triglycerides 103, HDL 54, LDL 142.  Non-HDL cholesterol 163.  Serum glucose 110 mg, frequency 5.8%, BUN 22, creatinine 0.87, eGFR greater than 60 him up.  Potassium 5.2.  CMP otherwise normal.  CBC normal  TSH 1.260 03/21/2020   Medications and allergies   Allergies  Allergen Reactions   Amlodipine     Hair loss.    Amoxicillin Nausea And Vomiting    Unknown   Pneumococcal 13-Val Conj Vacc Other (See Comments)   Prevnar 13 [Pneumococcal 13-Val Conj Vacc] Other (See Comments)   Tetanus-Diphth-Acell Pertussis Other (See Comments)   Codeine Nausea And Vomiting, Rash and Nausea Only   Tetanus-Diphth-Acell Pertussis Palpitations    Current Outpatient Medications on File Prior to Visit  Medication Sig Dispense Refill   amLODipine (NORVASC) 5 MG tablet Take 1 tablet (5 mg total) by mouth daily. 90 tablet 3   cholecalciferol (VITAMIN D) 1000 UNITS tablet Take 7,000 Units by mouth daily.      EUTHYROX 125 MCG tablet Take 1 tablet (125 mcg total) by mouth daily before breakfast. 30 tablet 1   liothyronine (CYTOMEL) 5 MCG tablet TAKE 2 TABLETS (10 MCG TOTAL) BY MOUTH DAILY. 180 tablet 1   Magnesium 300 MG CAPS Take 1 capsule by mouth daily. Reported on 02/14/2016     meloxicam (MOBIC) 15 MG tablet Take 15 mg by mouth daily as needed.      spironolactone (ALDACTONE) 25 MG tablet TAKE 1 TABLET BY MOUTH EVERY DAY IN THE MORNING 90 tablet 3   No current facility-administered medications on file prior to visit.    Radiology:  No results found.  Cardiac Studies:   SLEEP STUDY 03/24/12 (POSITIVE) follows Dr. Maxwell Caul and on CPAP and compliant.  Treadmill exercise stress test 03/25/2017: Indication: SoB and HTN Resting EKG  demonstrates NSR. The patient exercised according to [facility]  Protocol, Total time recorded 5:29 min achieving max heart rate of 146 which was 96% of THR for age and 7.05 METS of work. Stress terminated due to dyspnea, fatigue and THR (>85% MPHR)/MPHR met. Normal BP response. There was no ST-T changes of ischemia with exercise stress test. There were no significant arrhythmias. Normal BP response. Rec: No e/o ischemia by GXT. Exercise tolerence is markedly reduced due to aerobic intolerence. Continue Preventive therapy.  Echocardiogram 04/09/2017: Left ventricle cavity is normal in size. Moderate concentric hypertrophy of the left ventricle. Normal global wall motion. Doppler evidence of grade II (pseudonormal) diastolic dysfunction, elevated LAP. Calculated EF 56%. Left atrial cavity is mildly dilated at 4.1 cm. Mild (Grade I) mitral regurgitation. Mild tricuspid regurgitation. No evidence of pulmonary hypertension. Compared to the study done on 6/17/23rd in, grade 2 diastolic dysfunction, MR and TR new.  EKG   EKG 04/05/2021: Sinus rhythm at a rate of 75 bpm.  Normal axis.  Nonspecific T wave abnormality.  Low voltage complexes, consider pulmonary disease pattern.  Compared to EKG 08/22/2020, no PRWP.  EKG 08/31/2019: Normal sinus rhythm at rate of 72 bpm, normal axis.  No evidence of ischemia, normal EKG. No significant change from EKG 12/17/2017  Assessment     ICD-10-CM   1. Palpitations  R00.2 LONG TERM MONITOR (3-14 DAYS)    2. Primary hypertension  I10     3. Dyspnea on exertion  R06.00 PCV ECHOCARDIOGRAM COMPLETE    4. OSA on CPAP  G47.33    Z99.89     5. Class 3 severe obesity due to excess calories without serious comorbidity with body mass index (BMI) of 40.0 to 44.9 in adult (HCC)  E66.01    Z68.41      No orders of the defined types were placed in this encounter.  There are no discontinued medications.  Recommendations:   ALDINA PORTA is a 75 y.o. Caucasian female  patient with history of HTN, very mild HLD with elevated LDL particle number, obesity, and pre-diabetes, obstructive sleep apnea and has been using CPAP.  Patient was seen by [place] about 6 weeks ago, made an appointment to see [place] in view of palpitations and dyspnea on exertion.  Patient has been having 20 to 30 minutes of palpitations with or without exertion activity and has been concerned whether this is atrial fibrillation.  Episodes are random.  No clear-cut relationship to exercise, diet or sleep.  Symptoms may represent either atrial fibrillation or any other form of arrhythmia, will perform 2-week extended EKG monitoring not live.  Blood pressure was elevated upon presentation, she was started on losartan however could not tolerate it as she felt poorly and has gone back to being on amlodipine.  I will continue the same, she is motivated in weight loss and wants to join some form of diet program.  I have discussed with her regarding [facility].  With regard to dyspnea on exertion, suspect is related to obesity and hypoventilation.  Her last treadmill stress test was about 5 years ago.  Can consider repeating stress test however would like to repeat an echocardiogram specifically to look for diastolic function and left atrial size.  I will see her back in 4 to 6 weeks for follow-up.  Weight loss discussed extensively.      Adrian Prows, PA-C 06/26/2021, 9:09 PM Office: [phone]

## 2021-07-01 ENCOUNTER — Other Ambulatory Visit: Payer: Self-pay | Admitting: Student

## 2021-07-12 DIAGNOSIS — R002 Palpitations: Secondary | ICD-10-CM | POA: Diagnosis not present

## 2021-07-16 DIAGNOSIS — R002 Palpitations: Secondary | ICD-10-CM | POA: Diagnosis not present

## 2021-07-22 DIAGNOSIS — R509 Fever, unspecified: Secondary | ICD-10-CM | POA: Diagnosis not present

## 2021-07-22 DIAGNOSIS — R5383 Other fatigue: Secondary | ICD-10-CM | POA: Diagnosis not present

## 2021-07-22 DIAGNOSIS — U071 COVID-19: Secondary | ICD-10-CM | POA: Diagnosis not present

## 2021-07-22 DIAGNOSIS — R197 Diarrhea, unspecified: Secondary | ICD-10-CM | POA: Diagnosis not present

## 2021-07-26 ENCOUNTER — Other Ambulatory Visit: Payer: Medicare Other

## 2021-07-27 ENCOUNTER — Other Ambulatory Visit: Payer: Self-pay | Admitting: Student

## 2021-08-02 ENCOUNTER — Other Ambulatory Visit: Payer: Self-pay

## 2021-08-02 ENCOUNTER — Ambulatory Visit: Payer: Medicare Other | Admitting: Cardiology

## 2021-08-02 ENCOUNTER — Ambulatory Visit: Payer: Medicare Other

## 2021-08-02 DIAGNOSIS — R0609 Other forms of dyspnea: Secondary | ICD-10-CM | POA: Diagnosis not present

## 2021-08-02 DIAGNOSIS — R0600 Dyspnea, unspecified: Secondary | ICD-10-CM

## 2021-08-08 ENCOUNTER — Encounter: Payer: Self-pay | Admitting: Cardiology

## 2021-08-08 ENCOUNTER — Ambulatory Visit: Payer: Medicare Other | Admitting: Cardiology

## 2021-08-08 ENCOUNTER — Other Ambulatory Visit: Payer: Self-pay

## 2021-08-08 ENCOUNTER — Other Ambulatory Visit: Payer: Medicare Other

## 2021-08-08 VITALS — BP 142/71 | HR 83 | Temp 98.2°F | Resp 17 | Ht 63.0 in | Wt 236.6 lb

## 2021-08-08 DIAGNOSIS — R0609 Other forms of dyspnea: Secondary | ICD-10-CM | POA: Diagnosis not present

## 2021-08-08 DIAGNOSIS — R002 Palpitations: Secondary | ICD-10-CM

## 2021-08-08 DIAGNOSIS — G4733 Obstructive sleep apnea (adult) (pediatric): Secondary | ICD-10-CM

## 2021-08-08 DIAGNOSIS — Z9989 Dependence on other enabling machines and devices: Secondary | ICD-10-CM | POA: Diagnosis not present

## 2021-08-08 DIAGNOSIS — I10 Essential (primary) hypertension: Secondary | ICD-10-CM

## 2021-08-08 DIAGNOSIS — R0600 Dyspnea, unspecified: Secondary | ICD-10-CM

## 2021-08-08 MED ORDER — AMLODIPINE BESYLATE 10 MG PO TABS
10.0000 mg | ORAL_TABLET | Freq: Every evening | ORAL | 3 refills | Status: DC
Start: 2021-08-08 — End: 2022-03-08

## 2021-08-08 NOTE — Progress Notes (Signed)
 Primary Physician/Referring:  Jones, Thomas L, MD  Patient ID: Sherry Bell, female    DOB: 07/18/1946, 75 y.o.   MRN: [id number]  Chief Complaint  Patient presents with   Shortness of Breath   Palpitations   Hypertension    4 weeks   HPI:    Sherry Bell  is a 75 y.o. Caucasian female patient with history of HTN, very mild HLD with elevated LDL particle number, obesity, and pre-diabetes, obstructive sleep apnea and has been using CPAP.  Patient was seen by us about 6 weeks ago, made an appointment to see us in view of palpitations and dyspnea on exertion.  She underwent echocardiogram and also extended EKG monitoring and presents for follow-up.  Except for occasional palpitations no other specific symptoms today and dyspnea on exertion has remained stable.  No leg edema, no PND or orthopnea.  Denies chest pain.  Past Medical History:  Diagnosis Date   Allergy    Anxiety    Arthritis    Cataract    Cystocele    Fibroids    Gastric reflux    Generalized headaches    H. pylori infection    8 years ago   Heart murmur    Mild mitral regurgitation- pt denies this 07-02-16 at her [facility]   Hiatal hernia    Hypertension    Obstructive sleep apnea    uses CPAP   Pain in limb    Sleep apnea    uses CPAP   Thyroid disease    Tubular adenoma of colon    Varicose veins    Past Surgical History:  Procedure Laterality Date   BREATH TEK H PYLORI N/A 05/14/2016   Procedure: BREATH TEK H PYLORI;  Surgeon: Jay M Pyrtle, MD;  Location: [facility];  Service: Gastroenterology;  Laterality: N/A;   COLONOSCOPY     Dr Orr   ESOPHAGOGASTRODUODENOSCOPY     Dr Orr   TONSILECTOMY, ADENOIDECTOMY, BILATERAL MYRINGOTOMY AND TUBES     UPPER GASTROINTESTINAL ENDOSCOPY     Family History  Problem Relation Age of Onset   Kidney disease Mother    Throat cancer Father        worked at [employer]   Hypothyroidism Sister    Atrial fibrillation Sister 71   Heart failure Brother    Hypertension  Brother    Heart failure Brother    Hypertension Brother    Colon cancer Neg Hx    Esophageal cancer Neg Hx    Rectal cancer Neg Hx    Stomach cancer Neg Hx    Colon polyps Neg Hx    Uterine cancer Neg Hx    Ovarian cancer Neg Hx    Social History   Tobacco Use   Smoking status: Never   Smokeless tobacco: Never  Substance Use Topics   Alcohol use: No   Marital Status: Single  ROS  Review of Systems  Constitutional: Positive for malaise/fatigue.  Cardiovascular:  Positive for dyspnea on exertion and palpitations. Negative for chest pain and leg swelling.  Respiratory:  Positive for snoring (on CPAP and compliant).   Musculoskeletal:  Positive for arthritis.  Gastrointestinal:  Negative for melena.  Objective   Vitals with BMI 08/08/2021 08/08/2021 06/26/2021  Height - 5' 3" -  Weight - 236 lbs 10 oz -  BMI - 41.92 -  Systolic 142 152 140  Diastolic 71 83 69  Pulse 83 90 76    Physical Exam Constitutional:        General: She is not in acute distress.    Appearance: She is well-developed. She is obese.     Comments: Morbidly obese  Neck:     Thyroid: No thyromegaly.     Vascular: No carotid bruit or JVD.  Cardiovascular:     Rate and Rhythm: Normal rate and regular rhythm.     Pulses: Normal pulses and intact distal pulses.     Heart sounds: Normal heart sounds. No murmur heard.   No gallop.  Pulmonary:     Effort: Pulmonary effort is normal.     Breath sounds: Normal breath sounds.  Abdominal:     General: Bowel sounds are normal.     Palpations: Abdomen is soft.     Comments: Obese. Pannus present  Musculoskeletal:     Right lower leg: No edema.     Left lower leg: No edema.  Skin:    General: Skin is warm and dry.   Laboratory examination:   TSH Recent Labs    09/26/20 1000 05/12/21 0852  TSH 0.52 0.62   External Labs: 10/05/2020: HDL 50, LDL 101, total cholesterol 170, triglycerides 109 A1c 6.1% BUN 18, creatinine 0.84  07/28/2019:  Total  cholesterol 216, triglycerides 103, HDL 54, LDL 142.  Non-HDL cholesterol 163.  Serum glucose 110 mg, frequency 5.8%, BUN 22, creatinine 0.87, eGFR greater than 60 him up.  Potassium 5.2.  CMP otherwise normal.  CBC normal  TSH 1.260 03/21/2020   Medications and allergies   Allergies  Allergen Reactions   Amlodipine     Hair loss.    Amoxicillin Nausea And Vomiting    Unknown   Pneumococcal 13-Val Conj Vacc Other (See Comments)   Prevnar 13 [Pneumococcal 13-Val Conj Vacc] Other (See Comments)   Tetanus-Diphth-Acell Pertussis Other (See Comments)   Codeine Nausea And Vomiting, Rash and Nausea Only   Tetanus-Diphth-Acell Pertussis Palpitations    Current Outpatient Medications on File Prior to Visit  Medication Sig Dispense Refill   cholecalciferol (VITAMIN D) 1000 UNITS tablet Take 7,000 Units by mouth daily.      EUTHYROX 125 MCG tablet Take 1 tablet (125 mcg total) by mouth daily before breakfast. 30 tablet 1   liothyronine (CYTOMEL) 5 MCG tablet TAKE 2 TABLETS (10 MCG TOTAL) BY MOUTH DAILY. 180 tablet 1   Magnesium 300 MG CAPS Take 1 capsule by mouth daily. Reported on 02/14/2016     meloxicam (MOBIC) 15 MG tablet Take 15 mg by mouth daily as needed.      spironolactone (ALDACTONE) 25 MG tablet TAKE 1 TABLET BY MOUTH EVERY DAY IN THE MORNING 90 tablet 3   No current facility-administered medications on file prior to visit.    Radiology:  No results found.  Cardiac Studies:   SLEEP STUDY 03/24/12 (POSITIVE) follows Dr. Maxwell Caul and on CPAP and compliant.  Treadmill exercise stress test 03/25/2017: Indication: SoB and HTN Resting EKG demonstrates NSR. The patient exercised according to [facility] Protocol, Total time recorded 5:29 min achieving max heart rate of 146 which was 96% of THR for age and 7.05 METS of work. Stress terminated due to dyspnea, fatigue and THR (>85% MPHR)/MPHR met. Normal BP response. There was no ST-T changes of ischemia with exercise stress test. There were no  significant arrhythmias. Normal BP response. Rec: No e/o ischemia by GXT. Exercise tolerence is markedly reduced due to aerobic intolerence. Continue Preventive therapy.  Zio Patch Extended out patient EKG monitoring 14 days starting 06/26/2021: Predominant rhythm is normal sinus rhythm.  There were 26 brief atrial tachycardia episodes, longest 19 beats.  There was 1 NSVT episode of 5 beats per occasional PVCs, PACs and ventricular couplets.  No symptoms reported.   PCV ECHOCARDIOGRAM COMPLETE 08/02/2021  Narrative Echocardiogram 08/02/2021: Normal LV systolic function with visual EF 60-65%. Left ventricle cavity is normal in size. Mild left ventricular hypertrophy. Normal global wall motion. Normal diastolic filling pattern, normal LAP. Mild (Grade I) mitral regurgitation. Mild tricuspid regurgitation. Mild pulmonary hypertension. RVSP measures 36 mmHg. IVC is dilated with a respiratory response of <50%, estimated RAP 15mmHG. Compared to study 04/09/2017: G2DD and elevated LAP is now normal, PHTN is new finding, otherwise no significant change.     EKG   EKG 04/05/2021: Sinus rhythm at a rate of 75 bpm.  Normal axis.  Nonspecific T wave abnormality.  Low voltage complexes, consider pulmonary disease pattern.  Compared to EKG 08/22/2020, no PRWP.  EKG 08/31/2019: Normal sinus rhythm at rate of 72 bpm, normal axis.  No evidence of ischemia, normal EKG. No significant change from EKG 12/17/2017  Assessment     ICD-10-CM   1. Palpitations  R00.2     2. Primary hypertension  I10 amLODipine (NORVASC) 10 MG tablet    3. Dyspnea on exertion  R06.00     4. OSA on CPAP  G47.33    Z99.89      Meds ordered this encounter  Medications   amLODipine (NORVASC) 10 MG tablet    Sig: Take 1 tablet (10 mg total) by mouth every evening.    Dispense:  90 tablet    Refill:  3    Medications Discontinued During This Encounter  Medication Reason   amLODipine (NORVASC) 5 MG tablet Reorder     Recommendations:   Sherry Bell is a 75 y.o. Caucasian female patient with history of HTN, very mild HLD with elevated LDL particle number, obesity, and pre-diabetes, obstructive sleep apnea and has been using CPAP.  Patient was seen by us about 6 weeks ago, made an appointment to see us in view of palpitations and dyspnea on exertion.  She underwent echocardiogram and also extended EKG monitoring and presents for follow-up.  I reviewed the results of the echocardiogram, mild pulm hypertension probably related to WHO group 3 in view of obesity and hypertension and age.  However fortunately the diastolic function has improved compared to 2018 echocardiogram.  Today there is no clinical evidence of heart failure.  I also reviewed extended EKG monitoring which reveals PACs and PVCs and 1 NSVT episode, suspect these are related to sleep apnea although underlying coronary disease for NSVT could be an etiology however she is without chest pain or dyspnea, 5 years ago she has had a negative treadmill stress test.  I simply reassured her, advised her that she is certainly at risk for development of atrial fibrillation and to be compliant with using CPAP and also weight loss was discussed extensively.  Blood pressure still remains elevated, will increase amlodipine to 10 mg daily.  Otherwise stable from cardiac standpoint, I will see her back on a as needed basis.     Jay Ganji, PA-C 08/08/2021, 4:05 PM Office: [phone]   

## 2021-08-15 ENCOUNTER — Ambulatory Visit: Payer: Medicare Other | Admitting: Endocrinology

## 2021-08-15 ENCOUNTER — Encounter: Payer: Self-pay | Admitting: Internal Medicine

## 2021-08-15 ENCOUNTER — Ambulatory Visit (INDEPENDENT_AMBULATORY_CARE_PROVIDER_SITE_OTHER): Payer: Medicare Other | Admitting: Internal Medicine

## 2021-08-15 ENCOUNTER — Other Ambulatory Visit: Payer: Self-pay

## 2021-08-15 VITALS — BP 136/74 | HR 80 | Temp 98.3°F | Resp 16 | Ht 63.0 in | Wt 237.0 lb

## 2021-08-15 DIAGNOSIS — H60541 Acute eczematoid otitis externa, right ear: Secondary | ICD-10-CM

## 2021-08-15 DIAGNOSIS — E785 Hyperlipidemia, unspecified: Secondary | ICD-10-CM | POA: Diagnosis not present

## 2021-08-15 DIAGNOSIS — R7303 Prediabetes: Secondary | ICD-10-CM | POA: Diagnosis not present

## 2021-08-15 DIAGNOSIS — I10 Essential (primary) hypertension: Secondary | ICD-10-CM

## 2021-08-15 DIAGNOSIS — N898 Other specified noninflammatory disorders of vagina: Secondary | ICD-10-CM | POA: Diagnosis not present

## 2021-08-15 DIAGNOSIS — B373 Candidiasis of vulva and vagina: Secondary | ICD-10-CM | POA: Diagnosis not present

## 2021-08-15 DIAGNOSIS — N8111 Cystocele, midline: Secondary | ICD-10-CM | POA: Diagnosis not present

## 2021-08-15 LAB — BASIC METABOLIC PANEL
BUN: 18 mg/dL (ref 6–23)
CO2: 31 mEq/L (ref 19–32)
Calcium: 9.6 mg/dL (ref 8.4–10.5)
Chloride: 101 mEq/L (ref 96–112)
Creatinine, Ser: 0.89 mg/dL (ref 0.40–1.20)
GFR: 64.31 mL/min (ref >60.00)
Glucose, Bld: 121 mg/dL — ABNORMAL HIGH (ref 70–99)
Potassium: 4.3 mEq/L (ref 3.5–5.1)
Sodium: 139 mEq/L (ref 135–145)

## 2021-08-15 LAB — HEMOGLOBIN A1C: Hgb A1c MFr Bld: 6 % (ref 4.6–6.5)

## 2021-08-15 MED ORDER — NEOMYCIN-POLYMYXIN-HC 3.5-10000-1 OT SOLN
3.0000 [drp] | Freq: Three times a day (TID) | OTIC | 1 refills | Status: DC
Start: 2021-08-15 — End: 2022-04-02

## 2021-08-15 NOTE — Progress Notes (Addendum)
Subjective:  Patient ID: Sherry Bell, female    DOB: [birth year]  Age: 75 y.o. MRN: [id number]  CC: New Patient (Initial Visit) and Hypertension  This visit occurred during the SARS-CoV-2 public health emergency.  Safety protocols were in place, including screening questions prior to the visit, additional usage of staff PPE, and extensive cleaning of exam room while observing appropriate contact time as indicated for disinfecting solutions.    HPI Sherry Bell presents for f/up and to establish.  She tells me her blood pressure has been well controlled.  She denies chest pain, shortness of breath, palpitations, or edema.  History Sherry Bell has a past medical history of Allergy, Anxiety, Arthritis, Cataract, Cystocele, Fibroids, Gastric reflux, Generalized headaches, H. pylori infection, Heart murmur, Hiatal hernia, Hypertension, Obstructive sleep apnea, Pain in limb, Sleep apnea, Thyroid disease, Tubular adenoma of colon, and Varicose veins.   She has a past surgical history that includes Tonsilectomy, adenoidectomy, bilateral myringotomy and tubes; Colonoscopy; Esophagogastroduodenoscopy; Breath tek h pylori (N/A, 05/14/2016); and Upper gastrointestinal endoscopy.   Her family history includes Atrial fibrillation (age of onset: 38) in her sister; Heart failure in her brother and brother; Hypertension in her brother and brother; Hypothyroidism in her sister; Kidney disease in her mother; Throat cancer in her father.She reports that she has never smoked. She has never used smokeless tobacco. She reports that she does not drink alcohol and does not use drugs.  Outpatient Medications Prior to Visit  Medication Sig Dispense Refill   amLODipine (NORVASC) 10 MG tablet Take 1 tablet (10 mg total) by mouth every evening. 90 tablet 3   cholecalciferol (VITAMIN D) 1000 UNITS tablet Take 7,000 Units by mouth daily.      EUTHYROX 125 MCG tablet Take 1 tablet (125 mcg total) by mouth daily before  breakfast. 30 tablet 1   liothyronine (CYTOMEL) 5 MCG tablet TAKE 2 TABLETS (10 MCG TOTAL) BY MOUTH DAILY. 180 tablet 1   Magnesium 300 MG CAPS Take 1 capsule by mouth daily. Reported on 02/14/2016     meloxicam (MOBIC) 15 MG tablet Take 15 mg by mouth daily as needed.      spironolactone (ALDACTONE) 25 MG tablet TAKE 1 TABLET BY MOUTH EVERY DAY IN THE MORNING 90 tablet 3   No facility-administered medications prior to visit.    ROS Review of Systems  Constitutional:  Negative for diaphoresis and fatigue.  HENT: Negative.         Right ear canal itches intermittently  Eyes: Negative.   Respiratory: Negative.    Cardiovascular:  Negative for chest pain, palpitations and leg swelling.  Gastrointestinal:  Negative for abdominal pain, diarrhea, nausea and vomiting.  Endocrine: Negative.   Genitourinary: Negative.  Negative for difficulty urinating and dysuria.  Musculoskeletal: Negative.  Negative for arthralgias.  Skin: Negative.  Negative for color change, pallor and rash.  Neurological:  Negative for dizziness, weakness, light-headedness and headaches.  Hematological:  Negative for adenopathy. Does not bruise/bleed easily.  Psychiatric/Behavioral: Negative.     Objective:  BP 136/74 (BP Location: Right Arm, Patient Position: Sitting, Cuff Size: Large)   Pulse 80   Temp 98.3 F (36.8 C) (Oral)   Resp 16   Ht 5\' 3"  (1.6 m)   Wt 237 lb (107.5 kg)   SpO2 96%   BMI 41.98 kg/m   Physical Exam Vitals reviewed.  HENT:     Right Ear: Hearing, tympanic membrane and external ear normal. There is no impacted cerumen. No foreign body.  Left Ear: Hearing, tympanic membrane and external ear normal. There is no impacted cerumen. No foreign body.     Ears:     Comments: In both EAC's there is faint scaling and flaking    Nose: Nose normal.     Mouth/Throat:     Mouth: Mucous membranes are moist.  Eyes:     General: No scleral icterus.    Conjunctiva/sclera: Conjunctivae normal.   Cardiovascular:     Rate and Rhythm: Normal rate and regular rhythm.     Heart sounds: No murmur heard. Pulmonary:     Effort: Pulmonary effort is normal.     Breath sounds: No stridor. No wheezing, rhonchi or rales.  Abdominal:     General: Abdomen is protuberant. Bowel sounds are normal. There is no distension.     Palpations: Abdomen is soft. There is no hepatomegaly, splenomegaly or mass.  Musculoskeletal:        General: Normal range of motion.     Cervical back: Neck supple.     Right lower leg: No edema.     Left lower leg: No edema.  Lymphadenopathy:     Cervical: No cervical adenopathy.  Skin:    General: Skin is warm and dry.     Coloration: Skin is not pale.  Neurological:     General: No focal deficit present.     Mental Status: She is alert.  Psychiatric:        Mood and Affect: Mood normal.        Behavior: Behavior normal.    Lab Results  Component Value Date   WBC 7.1 07/27/2018   HGB 13.0 07/27/2018   HCT 39.5 07/27/2018   PLT 193 07/27/2018   GLUCOSE 121 (H) 08/15/2021   CHOL 170 10/05/2020   TRIG 109 10/05/2020   HDL 50 10/05/2020   LDLCALC 101 10/05/2020   ALT 21 07/27/2018   AST 24 07/27/2018   NA 139 08/15/2021   K 4.3 08/15/2021   CL 101 08/15/2021   CREATININE 0.89 08/15/2021   BUN 18 08/15/2021   CO2 31 08/15/2021   TSH 0.62 05/12/2021   HGBA1C 6.0 08/15/2021     Assessment & Plan:   Sherry Bell was seen today for new patient (initial visit) and hypertension.  Diagnoses and all orders for this visit:  Primary hypertension- Her blood pressure is adequately well controlled.  Electrolytes and renal function are normal. -     Basic metabolic panel; Future -     Basic metabolic panel -     CBC with Differential/Platelet; Future -     CBC with Differential/Platelet  Prediabetes- Her A1c is at 6.0%.  Medical therapy is not indicated. -     Basic metabolic panel; Future -     Hemoglobin A1c; Future -     Hemoglobin A1c -     Basic  metabolic panel  Hyperlipidemia with target LDL less than 130- Her ASCVD risk score is less than 20% so I did not recommend a statin.  Acute eczematoid otitis externa, right ear -     neomycin-polymyxin-hydrocortisone (CORTISPORIN) OTIC solution; Place 3 drops into the right ear 3 (three) times daily.  I am having Sherry Bell start on neomycin-polymyxin-hydrocortisone. I am also having her maintain her Magnesium, cholecalciferol, meloxicam, liothyronine, spironolactone, Euthyrox, and amLODipine.  Meds ordered this encounter  Medications   neomycin-polymyxin-hydrocortisone (CORTISPORIN) OTIC solution    Sig: Place 3 drops into the right ear 3 (three) times daily.  Dispense:  10 mL    Refill:  1      Follow-up: Return in about 4 months (around 12/15/2021).  Sherry Calico, MD

## 2021-08-15 NOTE — Patient Instructions (Signed)

## 2021-08-16 LAB — CBC WITH DIFFERENTIAL/PLATELET
Basophils Absolute: 0.1 10*3/uL (ref 0.0–0.1)
Basophils Relative: 0.9 % (ref 0.0–3.0)
Eosinophils Absolute: 0.3 10*3/uL (ref 0.0–0.7)
Eosinophils Relative: 4.3 % (ref 0.0–5.0)
HCT: 39.7 % (ref 36.0–46.0)
Hemoglobin: 13.1 g/dL (ref 12.0–15.0)
Lymphocytes Relative: 26.9 % (ref 12.0–46.0)
Lymphs Abs: 2 10*3/uL (ref 0.7–4.0)
MCHC: 33 g/dL (ref 30.0–36.0)
MCV: 91.5 fl (ref 78.0–100.0)
Monocytes Absolute: 0.4 10*3/uL (ref 0.1–1.0)
Monocytes Relative: 5.5 % (ref 3.0–12.0)
Neutro Abs: 4.5 10*3/uL (ref 1.4–7.7)
Neutrophils Relative %: 62.4 % (ref 43.0–77.0)
Platelets: 210 10*3/uL (ref 150.0–400.0)
RBC: 4.34 Mil/uL (ref 3.87–5.11)
RDW: 13.8 % (ref 11.5–15.5)
WBC: 7.3 10*3/uL (ref 4.0–10.5)

## 2021-08-16 NOTE — Addendum Note (Signed)
Addended by: Janith Lima on: 08/16/2021 07:45 AM   Modules accepted: Level of Service

## 2021-08-29 DIAGNOSIS — G4733 Obstructive sleep apnea (adult) (pediatric): Secondary | ICD-10-CM | POA: Diagnosis not present

## 2021-09-06 DIAGNOSIS — M7742 Metatarsalgia, left foot: Secondary | ICD-10-CM | POA: Diagnosis not present

## 2021-09-06 DIAGNOSIS — M79672 Pain in left foot: Secondary | ICD-10-CM | POA: Diagnosis not present

## 2021-09-06 DIAGNOSIS — M7741 Metatarsalgia, right foot: Secondary | ICD-10-CM | POA: Diagnosis not present

## 2021-09-06 DIAGNOSIS — M79671 Pain in right foot: Secondary | ICD-10-CM | POA: Diagnosis not present

## 2021-09-19 DIAGNOSIS — M25571 Pain in right ankle and joints of right foot: Secondary | ICD-10-CM | POA: Diagnosis not present

## 2021-09-19 DIAGNOSIS — M25572 Pain in left ankle and joints of left foot: Secondary | ICD-10-CM | POA: Diagnosis not present

## 2021-09-26 ENCOUNTER — Other Ambulatory Visit: Payer: Self-pay | Admitting: Student

## 2021-09-26 ENCOUNTER — Other Ambulatory Visit: Payer: Self-pay | Admitting: Endocrinology

## 2021-10-16 DIAGNOSIS — M6701 Short Achilles tendon (acquired), right ankle: Secondary | ICD-10-CM | POA: Diagnosis not present

## 2021-10-16 DIAGNOSIS — M7661 Achilles tendinitis, right leg: Secondary | ICD-10-CM | POA: Diagnosis not present

## 2021-10-16 DIAGNOSIS — M7742 Metatarsalgia, left foot: Secondary | ICD-10-CM | POA: Diagnosis not present

## 2021-10-16 DIAGNOSIS — M7741 Metatarsalgia, right foot: Secondary | ICD-10-CM | POA: Diagnosis not present

## 2021-10-26 ENCOUNTER — Other Ambulatory Visit: Payer: Self-pay | Admitting: Endocrinology

## 2021-10-30 ENCOUNTER — Other Ambulatory Visit: Payer: Self-pay | Admitting: Endocrinology

## 2021-11-01 DIAGNOSIS — M1711 Unilateral primary osteoarthritis, right knee: Secondary | ICD-10-CM | POA: Diagnosis not present

## 2021-11-01 DIAGNOSIS — M1712 Unilateral primary osteoarthritis, left knee: Secondary | ICD-10-CM | POA: Diagnosis not present

## 2021-11-07 DIAGNOSIS — M1711 Unilateral primary osteoarthritis, right knee: Secondary | ICD-10-CM | POA: Diagnosis not present

## 2021-11-13 ENCOUNTER — Other Ambulatory Visit: Payer: Medicare Other

## 2021-11-14 ENCOUNTER — Other Ambulatory Visit: Payer: Self-pay

## 2021-11-14 ENCOUNTER — Other Ambulatory Visit (INDEPENDENT_AMBULATORY_CARE_PROVIDER_SITE_OTHER): Payer: Medicare Other

## 2021-11-14 DIAGNOSIS — E038 Other specified hypothyroidism: Secondary | ICD-10-CM | POA: Diagnosis not present

## 2021-11-14 LAB — TSH: TSH: 2.97 u[IU]/mL (ref 0.35–5.50)

## 2021-11-14 LAB — T4, FREE: Free T4: 0.92 ng/dL (ref 0.60–1.60)

## 2021-11-14 LAB — T3, FREE: T3, Free: 3.3 pg/mL (ref 2.3–4.2)

## 2021-11-21 ENCOUNTER — Ambulatory Visit (INDEPENDENT_AMBULATORY_CARE_PROVIDER_SITE_OTHER): Payer: Medicare Other | Admitting: Endocrinology

## 2021-11-21 ENCOUNTER — Other Ambulatory Visit: Payer: Self-pay

## 2021-11-21 ENCOUNTER — Encounter: Payer: Self-pay | Admitting: Endocrinology

## 2021-11-21 VITALS — BP 120/62 | HR 82 | Ht 63.0 in | Wt 239.8 lb

## 2021-11-21 DIAGNOSIS — R7303 Prediabetes: Secondary | ICD-10-CM | POA: Diagnosis not present

## 2021-11-21 DIAGNOSIS — E038 Other specified hypothyroidism: Secondary | ICD-10-CM

## 2021-11-21 NOTE — Patient Instructions (Addendum)
Check blood sugars on waking up 2-3 days a week  Also check blood sugars about 2 hours after meals and do this after different meals by rotation  Ideal blood sugar levels on waking up are <100 and about 2 hours after meal is 130-160  Rybelsus improves blood sugar control as well as can help with weight loss and reduces cardiovascular events. Need to take the capsules on empty stomach 30 minutes before breakfast with 4 ounces of water daily.   You may feel more fullness at mealtimes and try to keep portions at meals smaller Some people may have nausea or even vomiting that may occur in the first few days; usually the symptoms go away with time. Please call if nausea  does not improve within 2 weeks   Call in 3-4 weeks for Rx

## 2021-11-21 NOTE — Progress Notes (Signed)
Patient ID: Sherry Bell, female   DOB: 1946/02/29, 75 y.o.   MRN: [id number]           Reason for Appointment: Follow-up of hypothyroidism     History of Present Illness:    HYPOTHYROIDISM:  This was diagnosed probably in the 1990s when the patient had gone to her physician because of weight gain. She does not think she had any unusual fatigue at that time She was found to be hypothyroid She does not think she felt any different with taking the thyroid supplements but her weight stabilized.  Her previous endocrinologist put her on combination of Synthroid and Cytomel 25 g, 1/2 tablet, this was prescribed twice a day  Subsequently she had been taking the Cytomel only in the morning since she would forget the evening dose Prior to her visit here she has been on 25 mcg of levothyroxine, using half tablet twice daily  Recent history:  On her initial consultation her liothyronine was switched to 10 mcg instead of 12.5 for better compliance Also levothyroxine had been progressively increased until 1/21  Although she was complaining of nausea and fatigue on her last visit in June she feels fine now No unusual tiredness Her thyroid levels have not been fluctuating recently She is not clear how her fatigue and nausea were resolved She still continues the brand-name Synthroid and does not think this makes her nauseated  Because of fatigue she was told to try taking her liothyronine 5 mcg twice a day instead of 10 mcg in the morning but she is still taking 10 mcg in the morning  She is taking 125 mcg of brand-name SYNTHROID along with 10 mcg of liothyronine regularly before breakfast   Wt Readings from Last 3 Encounters:  11/21/21 239 lb 12.8 oz (108.8 kg)  08/15/21 237 lb (107.5 kg)  08/08/21 236 lb 9.6 oz (107.3 kg)    TSH is consistently normal, no significant change in free T4 or T3  Lab Results  Component Value Date   FREET4 0.92 11/14/2021   FREET4 1.04 05/12/2021    FREET4 0.96 09/26/2020   TSH 2.97 11/14/2021   TSH 0.62 05/12/2021   TSH 0.52 09/26/2020   Lab Results  Component Value Date   T3FREE 3.3 11/14/2021   T3FREE 2.9 05/12/2021   T3FREE 3.3 09/26/2020    THYROID cyst:  The patient's thyroid nodule was first discovered in early 2016 soon after she had a respiratory infection in 11/2014 She had felt a swelling on the left side of her neck and was seen by her PCP for evaluation in 3/16 She did not have any swelling prior to that.  She does not feel that her neck swelling has increased in size and it may have moved more towards the center of the neck  The thyroid ultrasound showed the following Dominant simple appearing cyst occupies much of the left lobe and measures approximately 5.2 x 5.4 x 4.6 cm.  This cyst does not have any complex features or mural nodularity. On review of her previous records she had a predominantly cystic lesion in the upper pole of her thyroid measuring 1.7 cm in 2003 This was biopsied in 2002 and was benign Also she had a subcentimeter cyst in the lower left pole of the left side in 2003  She had a recurrence of the cyst in 2017 with local pressure symptoms Aspiration of this cyst was successfully done in June 2017 by Dr. Loanne Drilling and about 100 mL of  fluid removed which was negative for cytology  No recurrence of the cyst since then   Allergies as of 11/21/2021       Reactions   Amlodipine    Hair loss.    Amoxicillin Nausea And Vomiting   Unknown   Pneumococcal 13-val Conj Vacc Other (See Comments)   Prevnar 13 [pneumococcal 13-val Conj Vacc] Other (See Comments)   Tetanus-diphth-acell Pertussis Other (See Comments)   Codeine Nausea And Vomiting, Rash, Nausea Only   Tetanus-diphth-acell Pertussis Palpitations        Medication List        Accurate as of November 21, 2021 11:30 AM. If you have any questions, ask your nurse or doctor.          amLODipine 10 MG tablet Commonly known as:  NORVASC Take 1 tablet (10 mg total) by mouth every evening.   cholecalciferol 1000 units tablet Commonly known as: VITAMIN D Take 7,000 Units by mouth daily.   liothyronine 5 MCG tablet Commonly known as: CYTOMEL TAKE 2 TABLETS BY MOUTH DAILY   Magnesium 300 MG Caps Take 1 capsule by mouth daily. Reported on 02/14/2016   meloxicam 15 MG tablet Commonly known as: MOBIC Take 15 mg by mouth daily as needed.   neomycin-polymyxin-hydrocortisone OTIC solution Commonly known as: CORTISPORIN Place 3 drops into the right ear 3 (three) times daily.   spironolactone 25 MG tablet Commonly known as: ALDACTONE TAKE 1 TABLET BY MOUTH EVERY DAY IN THE MORNING   Synthroid 125 MCG tablet Generic drug: levothyroxine TAKE 1 TABLET BY MOUTH DAILY BEFORE BREAKFAST.        Allergies:  Allergies  Allergen Reactions   Amlodipine     Hair loss.    Amoxicillin Nausea And Vomiting    Unknown   Pneumococcal 13-Val Conj Vacc Other (See Comments)   Prevnar 13 [Pneumococcal 13-Val Conj Vacc] Other (See Comments)   Tetanus-Diphth-Acell Pertussis Other (See Comments)   Codeine Nausea And Vomiting, Rash and Nausea Only   Tetanus-Diphth-Acell Pertussis Palpitations    Past Medical History:  Diagnosis Date   Allergy    Anxiety    Arthritis    Cataract    Cystocele    Fibroids    Gastric reflux    Generalized headaches    H. pylori infection    8 years ago   Heart murmur    Mild mitral regurgitation- pt denies this 07-02-16 at [facility]   Hiatal hernia    Hypertension    Obstructive sleep apnea    uses CPAP   Pain in limb    Sleep apnea    uses CPAP   Thyroid disease    Tubular adenoma of colon    Varicose veins     There is no history of radiation to the neck in childhood  Past Surgical History:  Procedure Laterality Date   BREATH TEK H PYLORI N/A 05/14/2016   Procedure: BREATH TEK Kandis Ban;  Surgeon: Jerene Bears, MD;  Location: [facility];  Service: Gastroenterology;   Laterality: N/A;   COLONOSCOPY     Dr Lajoyce Corners   ESOPHAGOGASTRODUODENOSCOPY     Dr Thomes Dinning, ADENOIDECTOMY, BILATERAL MYRINGOTOMY AND TUBES     UPPER GASTROINTESTINAL ENDOSCOPY      Family History  Problem Relation Age of Onset   Kidney disease Mother    Throat cancer Father        worked at [employer]   Hypothyroidism Sister    Atrial fibrillation  Sister 62   Heart failure Brother    Hypertension Brother    Heart failure Brother    Hypertension Brother    Colon cancer Neg Hx    Esophageal cancer Neg Hx    Rectal cancer Neg Hx    Stomach cancer Neg Hx    Colon polyps Neg Hx    Uterine cancer Neg Hx    Ovarian cancer Neg Hx     Social History:  reports that she has never smoked. She has never used smokeless tobacco. She reports that she does not drink alcohol and does not use drugs.   Review of Systems:     She has had sleep apnea diagnosed in 2013 using CPAP She is monitored by Dr. Elenore Rota  HYPERTENSION: Taking amlodipine from cardiologist Blood pressure readings as follows       BP Readings from Last 3 Encounters:  11/21/21 120/62  08/15/21 136/74  08/08/21 (!) 142/71   PREDIABETES: She was evaluated by her PCP and had a A1c in the prediabetic range However her postprandial reading was 121 and not clear if she has had a high fasting glucose She says she cannot walk because of knee pain and is having difficulty controlling cravings for sweets She is asking for weight loss medication and wants to get her blood sugars down She is having readings as high as 175 for her glucose postprandially using her granddaughters meter   Lab Results  Component Value Date   HGBA1C 6.0 08/15/2021   HGBA1C 6.1 10/05/2020   Lab Results  Component Value Date   LDLCALC 101 10/05/2020   CREATININE 0.89 08/15/2021      Examination:   BP 120/62    Pulse 82    Ht 5\' 3"  (1.6 m)    Wt 239 lb 12.8 oz (108.8 kg)    SpO2 97%    BMI 42.48 kg/m        Her thyroid exam shows no  palpable mass No local lymphadenopathy Biceps reflexes on the right are normal   Assessment/Plan:  HYPOTHYROIDISM, longstanding:   She is on liothyronine 10 mcg and Synthroid 125 mcg  Her dose has been continued unchanged and she is subjectively now feeling fairly good She is quite regular with her supplements  No recurrence of the left-sided thyroid cyst  PREDIABETES? She has a relatively high A1c but not clear if she has consistently high glucose readings Since she thinks she has had a reading of 175 postprandially and has difficulty losing weight she may benefit from Rybelsus This will also reduce her carbohydrate cravings  Discussed with the patient the nature of GLP-1 drugs, the actions on insulin secretion, slowing stomach emptying, reduction of appetite and reduced liver glucose production Explained that Rybelsus improves blood sugar control as well as produces weight loss and reduces cardiovascular events. Explained possible side effects especially nausea and vomiting that may occur in the first few days; usually side effects improve with time.  Patient to call if nausea or vomiting does not improve within 2 weeks Instructed to take the capsules on empty stomach 30 minutes before breakfast with 4 ounces of water daily.   Patient education material given  30-day sample of 3 mg Rybelsus given and she will need to call in 3 to 4 weeks to let us know how she is doing and we will switch to the 7 mg She can start monitoring blood sugars at home and given her guidelines on blood sugar target both fasting and  after meals  Offered her referral to the dietitian but she wants to do it on her own for now  Elayne Snare 11/21/2021

## 2022-01-15 DIAGNOSIS — H1045 Other chronic allergic conjunctivitis: Secondary | ICD-10-CM | POA: Diagnosis not present

## 2022-01-15 DIAGNOSIS — H35363 Drusen (degenerative) of macula, bilateral: Secondary | ICD-10-CM | POA: Diagnosis not present

## 2022-01-15 DIAGNOSIS — H524 Presbyopia: Secondary | ICD-10-CM | POA: Diagnosis not present

## 2022-01-15 DIAGNOSIS — H25813 Combined forms of age-related cataract, bilateral: Secondary | ICD-10-CM | POA: Diagnosis not present

## 2022-01-15 DIAGNOSIS — H33322 Round hole, left eye: Secondary | ICD-10-CM | POA: Diagnosis not present

## 2022-01-31 ENCOUNTER — Other Ambulatory Visit: Payer: Self-pay | Admitting: Student

## 2022-01-31 ENCOUNTER — Other Ambulatory Visit: Payer: Self-pay | Admitting: Endocrinology

## 2022-01-31 DIAGNOSIS — I10 Essential (primary) hypertension: Secondary | ICD-10-CM

## 2022-02-13 ENCOUNTER — Other Ambulatory Visit: Payer: Medicare Other

## 2022-02-15 DIAGNOSIS — Z20822 Contact with and (suspected) exposure to covid-19: Secondary | ICD-10-CM | POA: Diagnosis not present

## 2022-02-20 ENCOUNTER — Ambulatory Visit: Payer: Medicare Other | Admitting: Endocrinology

## 2022-03-07 ENCOUNTER — Telehealth: Payer: Self-pay

## 2022-03-07 NOTE — Telephone Encounter (Signed)
She can take the medication in the afternoon after food. Her symptoms of GERD she can try pepcid 20 mg or Omeprazole 20 mg OTC 2 tab daily for a week, then as needed

## 2022-03-08 ENCOUNTER — Telehealth: Payer: Self-pay | Admitting: Cardiology

## 2022-03-08 DIAGNOSIS — R002 Palpitations: Secondary | ICD-10-CM

## 2022-03-08 DIAGNOSIS — I10 Essential (primary) hypertension: Secondary | ICD-10-CM

## 2022-03-08 MED ORDER — NEBIVOLOL HCL 10 MG PO TABS: 10.0000 mg | ORAL_TABLET | Freq: Every day | ORAL | 6 refills | Status: DC

## 2022-03-08 NOTE — Telephone Encounter (Signed)
ICD-10-CM   ?1. Primary hypertension  I10 nebivolol (BYSTOLIC) 10 MG tablet  ?  ?2. Palpitations  R00.2 nebivolol (BYSTOLIC) 10 MG tablet  ?  ? ?Medications Discontinued During This Encounter  ?Medication Reason  ? amLODipine (NORVASC) 10 MG tablet Side effect (s)  ?  ?Meds ordered this encounter  ?Medications  ? nebivolol (BYSTOLIC) 10 MG tablet  ?  Sig: Take 1 tablet (10 mg total) by mouth daily.  ?  Dispense:  30 tablet  ?  Refill:  6  ?  Discontinue Amlodipine, hair loss and  heart burn  ?  ?I called the patient and discussed with her regarding her medications.  Previously she was on 5 mg of amlodipine and on our visit in September 2022, I decreased it to 10 mg due to uncontrolled hypertension. ? ?She does not want to try 5 mg now, she wants to try a different medication.  I will start her on Bystolic 10 mg daily.  Patient has multiple intolerances. ? ?I spent a total of 12 minutes in review of the chart and making changes to her medications and prescribing new Rx. ? ? ?Adrian Prows, MD, [facility] ?03/08/2022, 5:49 PM ?Office: [phone] ?Fax: [phone] ?Pager: [phone]  ?

## 2022-03-08 NOTE — Telephone Encounter (Signed)
Patient called yesterday and mentioned she experiences acid reflux when taking amlodipine. She stated she wants to take another medication in place of this, and she is waiting on this to be resolved. Please call her back at number provided. ?

## 2022-03-08 NOTE — Telephone Encounter (Signed)
She can take the medication in the afternoon after food. Her symptoms of GERD she can try pepcid 20 mg or Omeprazole 20 mg OTC 2 tab daily for a week, then as needed. I had sent this message yesterday to [facility]

## 2022-03-08 NOTE — Telephone Encounter (Signed)
Pt wants another medication. She stated she does take the amlodipine after eating and still gets acid reflux. And she does not want to add any other medications to her list. Please advise.

## 2022-03-08 NOTE — Telephone Encounter (Signed)
Patient called and mentioned she experiences acid reflux when taking amlodipine. She stated she wants to take another medication in place of this, and she is waiting on this to be resolved. Please call her back at number provided.

## 2022-03-09 NOTE — Telephone Encounter (Signed)
Called pt and she was aware

## 2022-03-15 ENCOUNTER — Other Ambulatory Visit: Payer: Self-pay | Admitting: Endocrinology

## 2022-03-16 DIAGNOSIS — Z20822 Contact with and (suspected) exposure to covid-19: Secondary | ICD-10-CM | POA: Diagnosis not present

## 2022-03-27 ENCOUNTER — Other Ambulatory Visit: Payer: Self-pay | Admitting: Endocrinology

## 2022-03-31 DIAGNOSIS — Z20822 Contact with and (suspected) exposure to covid-19: Secondary | ICD-10-CM | POA: Diagnosis not present

## 2022-04-01 DIAGNOSIS — Z20822 Contact with and (suspected) exposure to covid-19: Secondary | ICD-10-CM | POA: Diagnosis not present

## 2022-04-02 ENCOUNTER — Ambulatory Visit: Payer: Medicare Other | Admitting: Cardiology

## 2022-04-02 ENCOUNTER — Encounter: Payer: Self-pay | Admitting: Cardiology

## 2022-04-02 VITALS — BP 134/72 | HR 57 | Temp 98.0°F | Resp 16 | Ht 63.0 in | Wt 239.0 lb

## 2022-04-02 DIAGNOSIS — G4733 Obstructive sleep apnea (adult) (pediatric): Secondary | ICD-10-CM

## 2022-04-02 DIAGNOSIS — I10 Essential (primary) hypertension: Secondary | ICD-10-CM | POA: Diagnosis not present

## 2022-04-02 DIAGNOSIS — Z9989 Dependence on other enabling machines and devices: Secondary | ICD-10-CM | POA: Diagnosis not present

## 2022-04-02 DIAGNOSIS — R002 Palpitations: Secondary | ICD-10-CM | POA: Diagnosis not present

## 2022-04-02 MED ORDER — NEBIVOLOL HCL 10 MG PO TABS: 10.0000 mg | ORAL_TABLET | Freq: Every day | ORAL | 3 refills | Status: DC

## 2022-04-02 NOTE — Progress Notes (Signed)
? ?Primary Physician/Referring:  Janith Lima, MD ? ?Patient ID: Sherry Bell, female    DOB: 1946/09/27, 76 y.o.   MRN: [id number] ? ?Chief Complaint  ?Patient presents with  ? Palpitations  ? Follow-up  ? ?HPI:   ? ?Sherry Bell  is a 76 y.o. Caucasian female patient with history of HTN, very mild HLD with elevated LDL particle number, obesity, and pre-diabetes, obstructive sleep apnea and has been using CPAP.   ? ?Except for mild chronic dyspnea on exertion that has remained stable, she has not had any further palpitations since being on Bystolic.  She did not tolerate amlodipine due to hair loss and heartburn.  No leg edema, no PND or orthopnea.  Denies chest pain. ? ?Past Medical History:  ?Diagnosis Date  ? Allergy   ? Anxiety   ? Arthritis   ? Cataract   ? Cystocele   ? Fibroids   ? Gastric reflux   ? Generalized headaches   ? H. pylori infection   ? 8 years ago  ? Heart murmur   ? Mild mitral regurgitation- pt denies this 07-02-16 at her [facility]  ? Hiatal hernia   ? Hypertension   ? Obstructive sleep apnea   ? uses CPAP  ? Pain in limb   ? Sleep apnea   ? uses CPAP  ? Thyroid disease   ? Tubular adenoma of colon   ? Varicose veins   ? ?Past Surgical History:  ?Procedure Laterality Date  ? BREATH TEK H PYLORI N/A 05/14/2016  ? Procedure: BREATH TEK H PYLORI;  Surgeon: Jerene Bears, MD;  Location: [facility];  Service: Gastroenterology;  Laterality: N/A;  ? COLONOSCOPY    ? Dr Lajoyce Corners  ? ESOPHAGOGASTRODUODENOSCOPY    ? Dr Lajoyce Corners  ? TONSILECTOMY, ADENOIDECTOMY, BILATERAL MYRINGOTOMY AND TUBES    ? UPPER GASTROINTESTINAL ENDOSCOPY    ? ?Family History  ?Problem Relation Age of Onset  ? Kidney disease Mother   ? Throat cancer Father   ?     worked at [employer]  ? Hypothyroidism Sister   ? Atrial fibrillation Sister 33  ? Heart failure Brother   ? Hypertension Brother   ? Heart failure Brother   ? Hypertension Brother   ? Colon cancer Neg Hx   ? Esophageal cancer Neg Hx   ? Rectal cancer Neg Hx   ? Stomach cancer Neg  Hx   ? Colon polyps Neg Hx   ? Uterine cancer Neg Hx   ? Ovarian cancer Neg Hx   ? ?Social History  ? ?Tobacco Use  ? Smoking status: Never  ? Smokeless tobacco: Never  ?Substance Use Topics  ? Alcohol use: No  ? ?Marital Status: Single ? ?ROS  ?Review of Systems  ?Cardiovascular:  Positive for dyspnea on exertion and palpitations. Negative for chest pain and leg swelling.  ?Respiratory:  Positive for snoring (on CPAP and compliant).   ?Gastrointestinal:  Negative for melena.  ?Objective  ? ? ?  04/02/2022  ?  2:53 PM 04/02/2022  ?  2:49 PM 04/02/2022  ?  2:38 PM  ?Vitals with BMI  ?Height   '5\' 3"'   ?Weight   239 lbs  ?BMI   42.35  ?Systolic 915 056 979  ?Diastolic 72 63 73  ?Pulse 57 63 62  ?  ?Physical Exam ?Constitutional:   ?   General: She is not in acute distress. ?   Appearance: She is well-developed. She is obese.  ?  Comments: Morbidly obese  ?Neck:  ?   Thyroid: No thyromegaly.  ?   Vascular: No carotid bruit or JVD.  ?Cardiovascular:  ?   Rate and Rhythm: Normal rate and regular rhythm.  ?   Pulses: Normal pulses and intact distal pulses.  ?   Heart sounds: Normal heart sounds. No murmur heard. ?  No gallop.  ?Pulmonary:  ?   Effort: Pulmonary effort is normal.  ?   Breath sounds: Normal breath sounds.  ?Abdominal:  ?   General: Bowel sounds are normal.  ?   Palpations: Abdomen is soft.  ?   Comments: Obese. Pannus present  ?Musculoskeletal:  ?   Right lower leg: No edema.  ?   Left lower leg: No edema.  ?Skin: ?   General: Skin is warm and dry.  ? ?Laboratory examination:  ? ?TSH ?Recent Labs  ?  05/12/21 ?0852 11/14/21 ?2947  ?TSH 0.62 2.97  ? ?External Labs: ?10/05/2020: ?HDL 50, LDL 101, total cholesterol 170, triglycerides 109 ?A1c 6.1% ?BUN 18, creatinine 0.84 ? ?07/28/2019:  ?Total cholesterol 216, triglycerides 103, HDL 54, LDL 142.  Non-HDL cholesterol 163.  Serum glucose 110 mg, frequency 5.8%, BUN 22, creatinine 0.87, eGFR greater than 60 him up.  Potassium 5.2.  CMP otherwise normal.  CBC  normal ? ?TSH 1.260 03/21/2020  ? ?Medications and allergies  ? ?Allergies  ?Allergen Reactions  ? Amlodipine Other (See Comments)  ?  Hair loss. Heart burn  ? Amoxicillin Nausea And Vomiting  ?  Unknown  ? Pneumococcal 13-Val Conj Vacc Other (See Comments)  ? Prevnar 13 [Pneumococcal 13-Val Conj Vacc] Other (See Comments)  ? Tetanus-Diphth-Acell Pertussis Other (See Comments)  ? Codeine Nausea And Vomiting, Rash and Nausea Only  ? Tetanus-Diphth-Acell Pertussis Palpitations  ?  ?Current Outpatient Medications on File Prior to Visit  ?Medication Sig Dispense Refill  ? cholecalciferol (VITAMIN D) 1000 UNITS tablet Take 7,000 Units by mouth daily.     ? liothyronine (CYTOMEL) 5 MCG tablet TAKE 2 TABLETS BY MOUTH DAILY 180 tablet 0  ? Magnesium 300 MG CAPS Take 1 capsule by mouth daily. Reported on 02/14/2016    ? meloxicam (MOBIC) 15 MG tablet Take 15 mg by mouth daily as needed.     ? spironolactone (ALDACTONE) 25 MG tablet TAKE 1 TABLET BY MOUTH EVERY DAY IN THE MORNING 90 tablet 3  ? SYNTHROID 125 MCG tablet TAKE 1 TABLET BY MOUTH EVERY DAY BEFORE BREAKFAST 90 tablet 1  ? ?No current facility-administered medications on file prior to visit.  ? ? ?Radiology:  ?No results found. ? ?Cardiac Studies:  ? ?SLEEP STUDY 03/24/12 (POSITIVE) follows Dr. Maxwell Caul and on CPAP and compliant. ? ?Treadmill exercise stress test 03/25/2017: ?Indication: SoB and HTN ?Resting EKG demonstrates NSR. The patient exercised according to Bruce Protocol, Total time recorded 5:29 min achieving max heart rate of 146 which was 96% of THR for age and 7.05 METS of work. Stress terminated due to dyspnea, fatigue and THR (>85% MPHR)/MPHR met. Normal BP response. There was no ST-T changes of ischemia with exercise stress test. There were no significant arrhythmias. Normal BP response. ?Rec: No e/o ischemia by GXT. Exercise tolerence is markedly reduced due to aerobic intolerence. Continue Preventive therapy. ? ?Zio Patch Extended out patient EKG  monitoring 14 days starting 06/26/2021: ?Predominant rhythm is normal sinus rhythm.  There were 26 brief atrial tachycardia episodes, longest 19 beats.  There was 1 NSVT episode of 5 beats per occasional PVCs,  PACs and ventricular couplets.  No symptoms reported.  ? ?PCV ECHOCARDIOGRAM COMPLETE 08/02/2021 ? ?Narrative ?Echocardiogram 08/02/2021: ?Normal LV systolic function with visual EF 60-65%. Left ventricle cavity is normal in size. Mild left ventricular hypertrophy. Normal global wall motion. Normal diastolic filling pattern, normal LAP. ?Mild (Grade I) mitral regurgitation. ?Mild tricuspid regurgitation. Mild pulmonary hypertension. RVSP measures 36 mmHg. ?IVC is dilated with a respiratory response of <50%, estimated RAP 2mHG. ?Compared to study 04/09/2017: G2DD and elevated LAP is now normal, PHTN is new finding, otherwise no significant change. ?   ? ?EKG  ? ?EKG 04/02/2022: Normal sinus rhythm at rate of 59 bpm, normal EKG.     ? ?Assessment  ? ?  ICD-10-CM   ?1. Primary hypertension  I10 EKG 12-Lead  ?  nebivolol (BYSTOLIC) 10 MG tablet  ?  ?2. OSA on CPAP  G47.33   ? Z99.89   ?  ?3. Palpitations  R00.2 nebivolol (BYSTOLIC) 10 MG tablet  ?  ? ?Meds ordered this encounter  ?Medications  ? nebivolol (BYSTOLIC) 10 MG tablet  ?  Sig: Take 1 tablet (10 mg total) by mouth daily. Take 1/2 extra in morning if SBP> 150 mm Hg  ?  Dispense:  90 tablet  ?  Refill:  3  ? ? ?Medications Discontinued During This Encounter  ?Medication Reason  ? neomycin-polymyxin-hydrocortisone (CORTISPORIN) OTIC solution   ? nebivolol (BYSTOLIC) 10 MG tablet Reorder  ? ? ?Recommendations:  ? ?Sherry BERISHis a 76y.o. Caucasian female patient with history of HTN, very mild HLD with elevated LDL particle number, obesity, and pre-diabetes, obstructive sleep apnea and has been using CPAP.   ? ?The 615-monthffice visit for follow-up of hypertension and palpitations.  Since then she has been placed on Bystolic which she is tolerating,  occasionally she has noticed elevated blood pressure, advised her that she can take one half extra dose of the Bystolic if necessary.  Compliance with CPAP again stressed, she has been compliant.  EKG is unremarkable and n

## 2022-04-05 ENCOUNTER — Ambulatory Visit: Payer: Medicare Other | Admitting: Cardiology

## 2022-05-01 ENCOUNTER — Other Ambulatory Visit (INDEPENDENT_AMBULATORY_CARE_PROVIDER_SITE_OTHER): Payer: Medicare Other

## 2022-05-01 DIAGNOSIS — R7303 Prediabetes: Secondary | ICD-10-CM | POA: Diagnosis not present

## 2022-05-01 DIAGNOSIS — E038 Other specified hypothyroidism: Secondary | ICD-10-CM

## 2022-05-01 LAB — BASIC METABOLIC PANEL
BUN: 18 mg/dL (ref 6–23)
CO2: 28 mEq/L (ref 19–32)
Calcium: 9.6 mg/dL (ref 8.4–10.5)
Chloride: 102 mEq/L (ref 96–112)
Creatinine, Ser: 0.82 mg/dL (ref 0.40–1.20)
GFR: 70.6 mL/min (ref >60.00)
Glucose, Bld: 102 mg/dL — ABNORMAL HIGH (ref 70–99)
Potassium: 4.8 mEq/L (ref 3.5–5.1)
Sodium: 137 mEq/L (ref 135–145)

## 2022-05-01 LAB — TSH: TSH: 1.64 u[IU]/mL (ref 0.35–5.50)

## 2022-05-01 LAB — HEMOGLOBIN A1C: Hgb A1c MFr Bld: 6.1 % (ref 4.6–6.5)

## 2022-05-08 ENCOUNTER — Ambulatory Visit (INDEPENDENT_AMBULATORY_CARE_PROVIDER_SITE_OTHER): Payer: Medicare Other | Admitting: Endocrinology

## 2022-05-08 ENCOUNTER — Encounter: Payer: Self-pay | Admitting: Endocrinology

## 2022-05-08 VITALS — BP 130/60 | HR 56 | Ht 63.0 in | Wt 240.2 lb

## 2022-05-08 DIAGNOSIS — E038 Other specified hypothyroidism: Secondary | ICD-10-CM

## 2022-05-08 DIAGNOSIS — R7303 Prediabetes: Secondary | ICD-10-CM | POA: Diagnosis not present

## 2022-05-08 MED ORDER — RYBELSUS 7 MG PO TABS: 1.0000 | ORAL_TABLET | Freq: Every day | ORAL | 3 refills | Status: DC

## 2022-05-08 NOTE — Progress Notes (Unsigned)
Patient ID: Sherry Bell, female   DOB: 03/11/1946, 76 y.o.   MRN: [id number]           Reason for Appointment: Follow-up of hypothyroidism     History of Present Illness:    HYPOTHYROIDISM:  This was diagnosed probably in the 1990s when the patient had gone to her physician because of weight gain. She does not think she had any unusual fatigue at that time She was found to be hypothyroid She does not think she felt any different with taking the thyroid supplements but her weight stabilized.  Her previous endocrinologist put her on combination of Synthroid and Cytomel 25 g, 1/2 tablet, this was prescribed twice a day  Subsequently she had been taking the Cytomel only in the morning since she would forget the evening dose Prior to her visit here she has been on 25 mcg of levothyroxine, using half tablet twice daily  Recent history:  On her initial consultation her liothyronine was switched to 10 mcg instead of 12.5 for better compliance Also levothyroxine had been progressively increased until 1/21  No new symptoms of tiredness Her thyroid levels have been very consistent She still continues the brand-name Synthroid every morning Has not missed any doses  She is taking 125 mcg of brand-name SYNTHROID along with 10 mcg of liothyronine regularly before breakfast   Wt Readings from Last 3 Encounters:  05/08/22 240 lb 3.2 oz (109 kg)  04/02/22 239 lb (108.4 kg)  11/21/21 239 lb 12.8 oz (108.8 kg)    TSH is consistently normal, no significant change in free T4  Lab Results  Component Value Date   FREET4 0.92 11/14/2021   FREET4 1.04 05/12/2021   FREET4 0.96 09/26/2020   TSH 1.64 05/01/2022   TSH 2.97 11/14/2021   TSH 0.62 05/12/2021   Lab Results  Component Value Date   T3FREE 3.3 11/14/2021   T3FREE 2.9 05/12/2021   T3FREE 3.3 09/26/2020    THYROID cyst:  The patient's thyroid nodule was first discovered in early 2016 soon after she had a respiratory infection  in 11/2014 She had felt a swelling on the left side of her neck and was seen by her PCP for evaluation in 3/16 She did not have any swelling prior to that.  She does not feel that her neck swelling has increased in size and it may have moved more towards the center of the neck  The thyroid ultrasound showed the following Dominant simple appearing cyst occupies much of the left lobe and measures approximately 5.2 x 5.4 x 4.6 cm.  This cyst does not have any complex features or mural nodularity. On review of her previous records she had a predominantly cystic lesion in the upper pole of her thyroid measuring 1.7 cm in 2003 This was biopsied in 2002 and was benign Also she had a subcentimeter cyst in the lower left pole of the left side in 2003  She had a recurrence of the cyst in 2017 with local pressure symptoms Aspiration of this cyst was successfully done in June 2017 by Dr. Loanne Drilling and about 100 mL of fluid removed which was negative for cytology  No recurrence of the cyst on regular exams   Allergies as of 05/08/2022       Reactions   Amlodipine Other (See Comments)   Hair loss. Heart burn   Amoxicillin Nausea And Vomiting   Unknown   Pneumococcal 13-val Conj Vacc Other (See Comments)   Prevnar 13 [pneumococcal 13-val  Conj Vacc] Other (See Comments)   Tetanus-diphth-acell Pertussis Other (See Comments)   Codeine Nausea And Vomiting, Rash, Nausea Only   Tetanus-diphth-acell Pertussis Palpitations        Medication List        Accurate as of May 08, 2022 11:00 AM. If you have any questions, ask your nurse or doctor.          cholecalciferol 1000 units tablet Commonly known as: VITAMIN D Take 7,000 Units by mouth daily.   liothyronine 5 MCG tablet Commonly known as: CYTOMEL TAKE 2 TABLETS BY MOUTH DAILY   Magnesium 300 MG Caps Take 1 capsule by mouth daily. Reported on 02/14/2016   meloxicam 15 MG tablet Commonly known as: MOBIC Take 15 mg by mouth daily as  needed.   nebivolol 10 MG tablet Commonly known as: Bystolic Take 1 tablet (10 mg total) by mouth daily. Take 1/2 extra in morning if SBP> 150 mm Hg   spironolactone 25 MG tablet Commonly known as: ALDACTONE TAKE 1 TABLET BY MOUTH EVERY DAY IN THE MORNING   Synthroid 125 MCG tablet Generic drug: levothyroxine TAKE 1 TABLET BY MOUTH EVERY DAY BEFORE BREAKFAST        Allergies:  Allergies  Allergen Reactions   Amlodipine Other (See Comments)    Hair loss. Heart burn   Amoxicillin Nausea And Vomiting    Unknown   Pneumococcal 13-Val Conj Vacc Other (See Comments)   Prevnar 13 [Pneumococcal 13-Val Conj Vacc] Other (See Comments)   Tetanus-Diphth-Acell Pertussis Other (See Comments)   Codeine Nausea And Vomiting, Rash and Nausea Only   Tetanus-Diphth-Acell Pertussis Palpitations    Past Medical History:  Diagnosis Date   Allergy    Anxiety    Arthritis    Cataract    Cystocele    Fibroids    Gastric reflux    Generalized headaches    H. pylori infection    8 years ago   Heart murmur    Mild mitral regurgitation- pt denies this 07-02-16 at her [facility]   Hiatal hernia    Hypertension    Obstructive sleep apnea    uses CPAP   Pain in limb    Sleep apnea    uses CPAP   Thyroid disease    Tubular adenoma of colon    Varicose veins     There is no history of radiation to the neck in childhood  Past Surgical History:  Procedure Laterality Date   BREATH TEK H PYLORI N/A 05/14/2016   Procedure: BREATH TEK Kandis Ban;  Surgeon: Jerene Bears, MD;  Location: [facility];  Service: Gastroenterology;  Laterality: N/A;   COLONOSCOPY     Dr Lajoyce Corners   ESOPHAGOGASTRODUODENOSCOPY     Dr Lajoyce Corners   TONSILECTOMY, ADENOIDECTOMY, BILATERAL MYRINGOTOMY AND TUBES     UPPER GASTROINTESTINAL ENDOSCOPY      Family History  Problem Relation Age of Onset   Kidney disease Mother    Throat cancer Father        worked at [employer]   Hypothyroidism Sister    Atrial fibrillation Sister 45    Heart failure Brother    Hypertension Brother    Heart failure Brother    Hypertension Brother    Colon cancer Neg Hx    Esophageal cancer Neg Hx    Rectal cancer Neg Hx    Stomach cancer Neg Hx    Colon polyps Neg Hx    Uterine cancer Neg Hx  Ovarian cancer Neg Hx     Social History:  reports that she has never smoked. She has never used smokeless tobacco. She reports that she does not drink alcohol and does not use drugs.   Review of Systems:     She has had sleep apnea diagnosed in 2013 using CPAP She is monitored by Dr. Elenore Rota  HYPERTENSION: Taking amlodipine from cardiologist Blood pressure readings as follows       BP Readings from Last 3 Encounters:  05/08/22 130/60  04/02/22 134/72  11/21/21 120/62   PREDIABETES: She was evaluated by her PCP and had a A1c in the prediabetic range She had a postprandial reading of 121  Previously getting readings as high as 175 for her glucose postprandially using her granddaughters meter Now however her fasting glucose is only 102  She was told to try Rybelsus but she was afraid of side effects and did not do so, still had 3 mg sample   Lab Results  Component Value Date   HGBA1C 6.1 05/01/2022   HGBA1C 6.0 08/15/2021   HGBA1C 6.1 10/05/2020   Lab Results  Component Value Date   LDLCALC 101 10/05/2020   CREATININE 0.82 05/01/2022      Examination:   BP 130/60   Pulse (!) 56   Ht '5\' 3"'$  (1.6 m)   Wt 240 lb 3.2 oz (109 kg)   SpO2 97%   BMI 42.55 kg/m        Her thyroid exam is unremarkable No peripheral edema   Assessment/Plan:  HYPOTHYROIDISM, longstanding:   She is on liothyronine 10 mcg and Synthroid 125 mcg  Her dose has been very stable now She is subjectively doing well also To continue same regimen  No recurrence of the left-sided thyroid cyst  PREDIABETES She has a relatively high A1c but not clear if she has more than mild fasting hyperglycemia  Reassured her that Rybelsus is safe and has  no connection with thyroid cancer She agrees to try the sample of the 3 mg she has at home and discussed how to take this Given written prescription for 7 mg tablet also to get filled after the sample runs out Consider follow-up with a dietitian also Follow-up in 4 months  Sherry Bell Dwyane Dee 05/08/2022

## 2022-05-09 DIAGNOSIS — M170 Bilateral primary osteoarthritis of knee: Secondary | ICD-10-CM | POA: Diagnosis not present

## 2022-05-11 ENCOUNTER — Telehealth: Payer: Self-pay

## 2022-05-11 NOTE — Telephone Encounter (Signed)
Patient called in states that sample she had at home was expired. She will came to office to pick up sample of '3mg'$  rybelsus.

## 2022-05-30 NOTE — Telephone Encounter (Signed)
Patient called in states that she stopped taking Rybelsus because of nausea and diarrhea.

## 2022-06-19 DIAGNOSIS — Z01419 Encounter for gynecological examination (general) (routine) without abnormal findings: Secondary | ICD-10-CM | POA: Diagnosis not present

## 2022-06-19 DIAGNOSIS — Z124 Encounter for screening for malignant neoplasm of cervix: Secondary | ICD-10-CM | POA: Diagnosis not present

## 2022-06-19 DIAGNOSIS — Z0142 Encounter for cervical smear to confirm findings of recent normal smear following initial abnormal smear: Secondary | ICD-10-CM | POA: Diagnosis not present

## 2022-06-19 DIAGNOSIS — N760 Acute vaginitis: Secondary | ICD-10-CM | POA: Diagnosis not present

## 2022-06-19 DIAGNOSIS — Z6841 Body Mass Index (BMI) 40.0 and over, adult: Secondary | ICD-10-CM | POA: Diagnosis not present

## 2022-06-19 DIAGNOSIS — Z1231 Encounter for screening mammogram for malignant neoplasm of breast: Secondary | ICD-10-CM | POA: Diagnosis not present

## 2022-06-19 DIAGNOSIS — Z01411 Encounter for gynecological examination (general) (routine) with abnormal findings: Secondary | ICD-10-CM | POA: Diagnosis not present

## 2022-06-19 DIAGNOSIS — N898 Other specified noninflammatory disorders of vagina: Secondary | ICD-10-CM | POA: Diagnosis not present

## 2022-06-19 DIAGNOSIS — B3731 Acute candidiasis of vulva and vagina: Secondary | ICD-10-CM | POA: Diagnosis not present

## 2022-06-27 DIAGNOSIS — R928 Other abnormal and inconclusive findings on diagnostic imaging of breast: Secondary | ICD-10-CM | POA: Diagnosis not present

## 2022-06-27 DIAGNOSIS — R922 Inconclusive mammogram: Secondary | ICD-10-CM | POA: Diagnosis not present

## 2022-07-01 ENCOUNTER — Other Ambulatory Visit: Payer: Self-pay | Admitting: Endocrinology

## 2022-07-06 DIAGNOSIS — R590 Localized enlarged lymph nodes: Secondary | ICD-10-CM | POA: Diagnosis not present

## 2022-07-06 DIAGNOSIS — R928 Other abnormal and inconclusive findings on diagnostic imaging of breast: Secondary | ICD-10-CM | POA: Diagnosis not present

## 2022-07-06 DIAGNOSIS — C50911 Malignant neoplasm of unspecified site of right female breast: Secondary | ICD-10-CM | POA: Diagnosis not present

## 2022-07-06 DIAGNOSIS — Z170 Estrogen receptor positive status [ER+]: Secondary | ICD-10-CM | POA: Diagnosis not present

## 2022-07-09 DIAGNOSIS — Z170 Estrogen receptor positive status [ER+]: Secondary | ICD-10-CM | POA: Diagnosis not present

## 2022-07-09 DIAGNOSIS — C50911 Malignant neoplasm of unspecified site of right female breast: Secondary | ICD-10-CM | POA: Diagnosis not present

## 2022-07-17 DIAGNOSIS — N951 Menopausal and female climacteric states: Secondary | ICD-10-CM | POA: Diagnosis not present

## 2022-07-17 DIAGNOSIS — M8588 Other specified disorders of bone density and structure, other site: Secondary | ICD-10-CM | POA: Diagnosis not present

## 2022-07-17 DIAGNOSIS — C50811 Malignant neoplasm of overlapping sites of right female breast: Secondary | ICD-10-CM | POA: Diagnosis not present

## 2022-07-17 DIAGNOSIS — Z170 Estrogen receptor positive status [ER+]: Secondary | ICD-10-CM | POA: Diagnosis not present

## 2022-07-18 DIAGNOSIS — C50911 Malignant neoplasm of unspecified site of right female breast: Secondary | ICD-10-CM | POA: Diagnosis not present

## 2022-07-18 LAB — HM MAMMOGRAPHY: HM Mammogram: ABNORMAL — AB (ref 0–4)

## 2022-07-31 DIAGNOSIS — Z780 Asymptomatic menopausal state: Secondary | ICD-10-CM | POA: Diagnosis not present

## 2022-07-31 DIAGNOSIS — N951 Menopausal and female climacteric states: Secondary | ICD-10-CM | POA: Diagnosis not present

## 2022-08-01 DIAGNOSIS — C50411 Malignant neoplasm of upper-outer quadrant of right female breast: Secondary | ICD-10-CM | POA: Diagnosis not present

## 2022-08-01 DIAGNOSIS — C50911 Malignant neoplasm of unspecified site of right female breast: Secondary | ICD-10-CM | POA: Diagnosis not present

## 2022-08-07 DIAGNOSIS — I10 Essential (primary) hypertension: Secondary | ICD-10-CM | POA: Diagnosis not present

## 2022-08-07 DIAGNOSIS — Z881 Allergy status to other antibiotic agents status: Secondary | ICD-10-CM | POA: Diagnosis not present

## 2022-08-07 DIAGNOSIS — Z885 Allergy status to narcotic agent status: Secondary | ICD-10-CM | POA: Diagnosis not present

## 2022-08-07 DIAGNOSIS — Z170 Estrogen receptor positive status [ER+]: Secondary | ICD-10-CM | POA: Diagnosis not present

## 2022-08-07 DIAGNOSIS — Z6841 Body Mass Index (BMI) 40.0 and over, adult: Secondary | ICD-10-CM | POA: Diagnosis not present

## 2022-08-07 DIAGNOSIS — G4730 Sleep apnea, unspecified: Secondary | ICD-10-CM | POA: Diagnosis not present

## 2022-08-07 DIAGNOSIS — C50911 Malignant neoplasm of unspecified site of right female breast: Secondary | ICD-10-CM | POA: Diagnosis not present

## 2022-08-07 DIAGNOSIS — Z887 Allergy status to serum and vaccine status: Secondary | ICD-10-CM | POA: Diagnosis not present

## 2022-08-10 DIAGNOSIS — C50911 Malignant neoplasm of unspecified site of right female breast: Secondary | ICD-10-CM | POA: Diagnosis not present

## 2022-08-14 DIAGNOSIS — C50811 Malignant neoplasm of overlapping sites of right female breast: Secondary | ICD-10-CM | POA: Diagnosis not present

## 2022-08-14 DIAGNOSIS — Z170 Estrogen receptor positive status [ER+]: Secondary | ICD-10-CM | POA: Diagnosis not present

## 2022-08-14 DIAGNOSIS — M8588 Other specified disorders of bone density and structure, other site: Secondary | ICD-10-CM | POA: Diagnosis not present

## 2022-08-15 DIAGNOSIS — Z9889 Other specified postprocedural states: Secondary | ICD-10-CM | POA: Diagnosis not present

## 2022-08-20 ENCOUNTER — Ambulatory Visit (INDEPENDENT_AMBULATORY_CARE_PROVIDER_SITE_OTHER): Payer: Medicare Other | Admitting: Internal Medicine

## 2022-08-20 ENCOUNTER — Encounter: Payer: Self-pay | Admitting: Internal Medicine

## 2022-08-20 VITALS — BP 124/68 | HR 95 | Temp 98.6°F | Ht 63.0 in | Wt 233.0 lb

## 2022-08-20 DIAGNOSIS — B002 Herpesviral gingivostomatitis and pharyngotonsillitis: Secondary | ICD-10-CM | POA: Diagnosis not present

## 2022-08-20 DIAGNOSIS — E785 Hyperlipidemia, unspecified: Secondary | ICD-10-CM

## 2022-08-20 DIAGNOSIS — I10 Essential (primary) hypertension: Secondary | ICD-10-CM

## 2022-08-20 MED ORDER — VALACYCLOVIR HCL 1 G PO TABS: 1000.0000 mg | ORAL_TABLET | Freq: Two times a day (BID) | ORAL | 0 refills | Status: AC

## 2022-08-20 NOTE — Progress Notes (Unsigned)
Subjective:  Patient ID: Sherry Bell, female    DOB: November 30, 1945  Age: 76 y.o. MRN: [id number]  CC: No chief complaint on file.   HPI Sherry Bell presents for ***  Outpatient Medications Prior to Visit  Medication Sig Dispense Refill   cholecalciferol (VITAMIN D) 1000 UNITS tablet Take 7,000 Units by mouth daily.      liothyronine (CYTOMEL) 5 MCG tablet TAKE 2 TABLETS BY MOUTH DAILY 180 tablet 2   Magnesium 300 MG CAPS Take 1 capsule by mouth daily. Reported on 02/14/2016     meloxicam (MOBIC) 15 MG tablet Take 15 mg by mouth daily as needed.      nebivolol (BYSTOLIC) 10 MG tablet Take 1 tablet (10 mg total) by mouth daily. Take 1/2 extra in morning if SBP> 150 mm Hg 90 tablet 3   Semaglutide (RYBELSUS) 7 MG TABS Take 1 tablet by mouth daily before breakfast. Take 30 minutes before breakfast with 4 oz. water 30 tablet 3   spironolactone (ALDACTONE) 25 MG tablet TAKE 1 TABLET BY MOUTH EVERY DAY IN THE MORNING 90 tablet 3   SYNTHROID 125 MCG tablet TAKE 1 TABLET BY MOUTH EVERY DAY BEFORE BREAKFAST 90 tablet 1   No facility-administered medications prior to visit.    ROS Review of Systems  Objective:  BP 124/68 (BP Location: Left Arm, Patient Position: Sitting, Cuff Size: Large)   Pulse 95   Temp 98.6 F (37 C) (Oral)   Ht '5\' 3"'$  (1.6 m)   Wt 233 lb (105.7 kg)   SpO2 95%   BMI 41.27 kg/m   BP Readings from Last 3 Encounters:  08/20/22 124/68  05/08/22 130/60  04/02/22 134/72    Wt Readings from Last 3 Encounters:  08/20/22 233 lb (105.7 kg)  05/08/22 240 lb 3.2 oz (109 kg)  04/02/22 239 lb (108.4 kg)    Physical Exam  Lab Results  Component Value Date   WBC 7.3 08/15/2021   HGB 13.1 08/15/2021   HCT 39.7 08/15/2021   PLT 210.0 08/15/2021   GLUCOSE 102 (H) 05/01/2022   CHOL 170 10/05/2020   TRIG 109 10/05/2020   HDL 50 10/05/2020   LDLCALC 101 10/05/2020   ALT 21 07/27/2018   AST 24 07/27/2018   NA 137 05/01/2022   K 4.8 05/01/2022   CL 102  05/01/2022   CREATININE 0.82 05/01/2022   BUN 18 05/01/2022   CO2 28 05/01/2022   TSH 1.64 05/01/2022   HGBA1C 6.1 05/01/2022    US PELVIC COMPLETE WITH TRANSVAGINAL  Result Date: 04/18/2021 CLINICAL DATA:  Fibroid uterus, postmenopausal EXAM: TRANSABDOMINAL AND TRANSVAGINAL ULTRASOUND OF PELVIS TECHNIQUE: Both transabdominal and transvaginal ultrasound examinations of the pelvis were performed. Transabdominal technique was performed for global imaging of the pelvis including uterus, ovaries, adnexal regions, and pelvic cul-de-sac. It was necessary to proceed with endovaginal exam following the transabdominal exam to visualize the uterus, endometrium, and ovaries. COMPARISON:  None FINDINGS: Uterus Measurements: 14.5 x 4.9 x 11.0 cm = volume: 414 mL. Anteverted. Large mid uterine mass 8.3 x 8.8 x 8.6 cm consistent with a large leiomyoma, which causes partial shadowing. Additional smaller leiomyomata at anterior wall and posterior walls of upper uterus, several partially calcified. Small nabothian cyst at cervix. Endometrium Thickness: 12 mm. Visualized portion at upper uterine segment is abnormally thickened for a postmenopausal patient. Mid uterine portion is obscured secondary to large leiomyoma Right ovary Not visualized, likely obscured by bowel Left ovary Not visualized, likely obscured by  bowel Other findings No free pelvic fluid.  No adnexal masses. IMPRESSION: Multiple leiomyomata, largest of which is an 8.8 cm diameter mass at the mid uterus. Thickened endometrial complex 12 mm thick; Endometrial thickness is considered abnormal for an asymptomatic post-menopausal female and endometrial sampling should be considered to exclude carcinoma. These results will be called to the ordering clinician or representative by the Radiologist Assistant, and communication documented in the PACS or [employer]. Electronically Signed   By: Lavonia Dana M.D.   On: 04/18/2021 13:16    Assessment & Plan:    Diagnoses and all orders for this visit:  Primary hypertension  Hyperlipidemia with target LDL less than 130  Oral herpes simplex infection -     valACYclovir (VALTREX) 1000 MG tablet; Take 1 tablet (1,000 mg total) by mouth 2 (two) times daily for 10 days.   I am having Sherry Bell start on valACYclovir. I am also having her maintain her Magnesium, cholecalciferol, meloxicam, Synthroid, spironolactone, nebivolol, Rybelsus, and liothyronine.  Meds ordered this encounter  Medications   valACYclovir (VALTREX) 1000 MG tablet    Sig: Take 1 tablet (1,000 mg total) by mouth 2 (two) times daily for 10 days.    Dispense:  20 tablet    Refill:  0     Follow-up: No follow-ups on file.  Scarlette Calico, MD

## 2022-08-20 NOTE — Patient Instructions (Signed)
Hypertension, Adult High blood pressure (hypertension) is when the force of blood pumping through the arteries is too strong. The arteries are the blood vessels that carry blood from the heart throughout the body. Hypertension forces the heart to work harder to pump blood and may cause arteries to become narrow or stiff. Untreated or uncontrolled hypertension can lead to a heart attack, heart failure, a stroke, kidney disease, and other problems. A blood pressure reading consists of a higher number over a lower number. Ideally, your blood pressure should be below 120/80. The first ("top") number is called the systolic pressure. It is a measure of the pressure in your arteries as your heart beats. The second ("bottom") number is called the diastolic pressure. It is a measure of the pressure in your arteries as the heart relaxes. What are the causes? The exact cause of this condition is not known. There are some conditions that result in high blood pressure. What increases the risk? Certain factors may make you more likely to develop high blood pressure. Some of these risk factors are under your control, including: Smoking. Not getting enough exercise or physical activity. Being overweight. Having too much fat, sugar, calories, or salt (sodium) in your diet. Drinking too much alcohol. Other risk factors include: Having a personal history of heart disease, diabetes, high cholesterol, or kidney disease. Stress. Having a family history of high blood pressure and high cholesterol. Having obstructive sleep apnea. Age. The risk increases with age. What are the signs or symptoms? High blood pressure may not cause symptoms. Very high blood pressure (hypertensive crisis) may cause: Headache. Fast or irregular heartbeats (palpitations). Shortness of breath. Nosebleed. Nausea and vomiting. Vision changes. Severe chest pain, dizziness, and seizures. How is this diagnosed? This condition is diagnosed by  measuring your blood pressure while you are seated, with your arm resting on a flat surface, your legs uncrossed, and your feet flat on the floor. The cuff of the blood pressure monitor will be placed directly against the skin of your upper arm at the level of your heart. Blood pressure should be measured at least twice using the same arm. Certain conditions can cause a difference in blood pressure between your right and left arms. If you have a high blood pressure reading during one visit or you have normal blood pressure with other risk factors, you may be asked to: Return on a different day to have your blood pressure checked again. Monitor your blood pressure at home for 1 week or longer. If you are diagnosed with hypertension, you may have other blood or imaging tests to help your health care provider understand your overall risk for other conditions. How is this treated? This condition is treated by making healthy lifestyle changes, such as eating healthy foods, exercising more, and reducing your alcohol intake. You may be referred for counseling on a healthy diet and physical activity. Your health care provider may prescribe medicine if lifestyle changes are not enough to get your blood pressure under control and if: Your systolic blood pressure is above 130. Your diastolic blood pressure is above 80. Your personal target blood pressure may vary depending on your medical conditions, your age, and other factors. Follow these instructions at home: Eating and drinking  Eat a diet that is high in fiber and potassium, and low in sodium, added sugar, and fat. An example of this eating plan is called the DASH diet. DASH stands for Dietary Approaches to Stop Hypertension. To eat this way: Eat   plenty of fresh fruits and vegetables. Try to fill one half of your plate at each meal with fruits and vegetables. Eat whole grains, such as whole-wheat pasta, brown rice, or whole-grain bread. Fill about one  fourth of your plate with whole grains. Eat or drink low-fat dairy products, such as skim milk or low-fat yogurt. Avoid fatty cuts of meat, processed or cured meats, and poultry with skin. Fill about one fourth of your plate with lean proteins, such as fish, chicken without skin, beans, eggs, or tofu. Avoid pre-made and processed foods. These tend to be higher in sodium, added sugar, and fat. Reduce your daily sodium intake. Many people with hypertension should eat less than 1,500 mg of sodium a day. Do not drink alcohol if: Your health care provider tells you not to drink. You are pregnant, may be pregnant, or are planning to become pregnant. If you drink alcohol: Limit how much you have to: 0-1 drink a day for women. 0-2 drinks a day for men. Know how much alcohol is in your drink. In the [place] one drink equals one 12 oz bottle of beer (355 mL), one 5 oz glass of wine (148 mL), or one 1 oz glass of hard liquor (44 mL). Lifestyle  Work with your health care provider to maintain a healthy body weight or to lose weight. Ask what an ideal weight is for you. Get at least 30 minutes of exercise that causes your heart to beat faster (aerobic exercise) most days of the week. Activities may include walking, swimming, or biking. Include exercise to strengthen your muscles (resistance exercise), such as Pilates or lifting weights, as part of your weekly exercise routine. Try to do these types of exercises for 30 minutes at least 3 days a week. Do not use any products that contain nicotine or tobacco. These products include cigarettes, chewing tobacco, and vaping devices, such as e-cigarettes. If you need help quitting, ask your health care provider. Monitor your blood pressure at home as told by your health care provider. Keep all follow-up visits. This is important. Medicines Take over-the-counter and prescription medicines only as told by your health care provider. Follow directions carefully. Blood  pressure medicines must be taken as prescribed. Do not skip doses of blood pressure medicine. Doing this puts you at risk for problems and can make the medicine less effective. Ask your health care provider about side effects or reactions to medicines that you should watch for. Contact a health care provider if you: Think you are having a reaction to a medicine you are taking. Have headaches that keep coming back (recurring). Feel dizzy. Have swelling in your ankles. Have trouble with your vision. Get help right away if you: Develop a severe headache or confusion. Have unusual weakness or numbness. Feel faint. Have severe pain in your chest or abdomen. Vomit repeatedly. Have trouble breathing. These symptoms may be an emergency. Get help right away. Call 911. Do not wait to see if the symptoms will go away. Do not drive yourself to the hospital. Summary Hypertension is when the force of blood pumping through your arteries is too strong. If this condition is not controlled, it may put you at risk for serious complications. Your personal target blood pressure may vary depending on your medical conditions, your age, and other factors. For most people, a normal blood pressure is less than 120/80. Hypertension is treated with lifestyle changes, medicines, or a combination of both. Lifestyle changes include losing weight, eating a healthy,   low-sodium diet, exercising more, and limiting alcohol. This information is not intended to replace advice given to you by your health care provider. Make sure you discuss any questions you have with your health care provider. Document Revised: 09/19/2021 Document Reviewed: 09/19/2021 Elsevier Patient Education  2023 [employer]vier Inc.  

## 2022-08-28 ENCOUNTER — Encounter: Payer: Self-pay | Admitting: Internal Medicine

## 2022-08-29 ENCOUNTER — Telehealth: Payer: Self-pay | Admitting: Internal Medicine

## 2022-08-29 DIAGNOSIS — M5416 Radiculopathy, lumbar region: Secondary | ICD-10-CM | POA: Diagnosis not present

## 2022-08-29 NOTE — Telephone Encounter (Signed)
Patient would like a referral to Healthcare - [phone] - concerning CPAP

## 2022-09-01 ENCOUNTER — Other Ambulatory Visit: Payer: Self-pay | Admitting: Cardiology

## 2022-09-01 DIAGNOSIS — I10 Essential (primary) hypertension: Secondary | ICD-10-CM

## 2022-09-01 DIAGNOSIS — R002 Palpitations: Secondary | ICD-10-CM

## 2022-09-11 DIAGNOSIS — M6281 Muscle weakness (generalized): Secondary | ICD-10-CM | POA: Diagnosis not present

## 2022-09-11 DIAGNOSIS — C50911 Malignant neoplasm of unspecified site of right female breast: Secondary | ICD-10-CM | POA: Diagnosis not present

## 2022-09-11 DIAGNOSIS — Z9889 Other specified postprocedural states: Secondary | ICD-10-CM | POA: Diagnosis not present

## 2022-09-12 ENCOUNTER — Ambulatory Visit (AMBULATORY_SURGERY_CENTER): Payer: Self-pay | Admitting: *Deleted

## 2022-09-12 VITALS — Ht 63.0 in | Wt 231.6 lb

## 2022-09-12 DIAGNOSIS — Z8719 Personal history of other diseases of the digestive system: Secondary | ICD-10-CM

## 2022-09-12 DIAGNOSIS — K317 Polyp of stomach and duodenum: Secondary | ICD-10-CM

## 2022-09-12 DIAGNOSIS — Z86010 Personal history of colonic polyps: Secondary | ICD-10-CM

## 2022-09-12 MED ORDER — PEG 3350-KCL-NA BICARB-NACL 420 G PO SOLR: 4000.0000 mL | Freq: Once | ORAL | 0 refills | Status: AC

## 2022-09-12 NOTE — Progress Notes (Signed)

## 2022-09-17 DIAGNOSIS — M5416 Radiculopathy, lumbar region: Secondary | ICD-10-CM | POA: Diagnosis not present

## 2022-09-18 ENCOUNTER — Telehealth: Payer: Self-pay | Admitting: Internal Medicine

## 2022-09-18 DIAGNOSIS — Z86010 Personal history of colonic polyps: Secondary | ICD-10-CM

## 2022-09-18 DIAGNOSIS — Z8719 Personal history of other diseases of the digestive system: Secondary | ICD-10-CM

## 2022-09-18 DIAGNOSIS — K317 Polyp of stomach and duodenum: Secondary | ICD-10-CM

## 2022-09-19 ENCOUNTER — Other Ambulatory Visit: Payer: Self-pay | Admitting: Internal Medicine

## 2022-09-19 DIAGNOSIS — Z86010 Personal history of colonic polyps: Secondary | ICD-10-CM

## 2022-09-19 DIAGNOSIS — Z8719 Personal history of other diseases of the digestive system: Secondary | ICD-10-CM

## 2022-09-19 DIAGNOSIS — K317 Polyp of stomach and duodenum: Secondary | ICD-10-CM

## 2022-09-19 MED ORDER — SUTAB 1479-225-188 MG PO TABS: ORAL_TABLET | ORAL | 0 refills | Status: DC

## 2022-09-19 NOTE — Telephone Encounter (Signed)
Verified pharmacy with pt. Rx for sutab sent into pharmacy per instructed,made pt. Aware of cost and that new instructions have been sent via my chart and hard copy will be mailed also,all questions answered,pt. Verbalized understanding.

## 2022-09-19 NOTE — Telephone Encounter (Signed)
Patient called would like to get her prep in tablet form instead please advise.

## 2022-09-20 ENCOUNTER — Other Ambulatory Visit: Payer: Self-pay | Admitting: Internal Medicine

## 2022-09-20 DIAGNOSIS — Z8719 Personal history of other diseases of the digestive system: Secondary | ICD-10-CM

## 2022-09-20 DIAGNOSIS — Z86010 Personal history of colonic polyps: Secondary | ICD-10-CM

## 2022-09-20 DIAGNOSIS — K317 Polyp of stomach and duodenum: Secondary | ICD-10-CM

## 2022-09-24 DIAGNOSIS — M5416 Radiculopathy, lumbar region: Secondary | ICD-10-CM | POA: Diagnosis not present

## 2022-09-25 ENCOUNTER — Telehealth: Payer: Self-pay

## 2022-09-25 ENCOUNTER — Other Ambulatory Visit: Payer: Self-pay | Admitting: Internal Medicine

## 2022-09-25 ENCOUNTER — Telehealth: Payer: Self-pay | Admitting: Internal Medicine

## 2022-09-25 DIAGNOSIS — Z6841 Body Mass Index (BMI) 40.0 and over, adult: Secondary | ICD-10-CM

## 2022-09-25 DIAGNOSIS — G4733 Obstructive sleep apnea (adult) (pediatric): Secondary | ICD-10-CM

## 2022-09-25 DIAGNOSIS — R0609 Other forms of dyspnea: Secondary | ICD-10-CM

## 2022-09-25 NOTE — Telephone Encounter (Signed)
Patient wants a referral but does not want to have an appointment to disccus with the doctor - I told patient that if she had never discussed her sleep problems with Dr. Ronnald Ramp that she would need an appointment for a referral.  Patient became very irate and said she wants someone to call her.

## 2022-09-25 NOTE — Telephone Encounter (Signed)
I have already made a referral to Pulmonary.

## 2022-09-25 NOTE — Telephone Encounter (Signed)
Pt stated she wanted a referral for cpap to number provided below. She does not know the name of the facility. She stated that she would refuse to come in for an OV with PCP and demands referral to be entered because she "doesn't understand what is wrong with you people". I informed pt per insurance guidelines that an OV note would be needed to accompany the referral. Please advise.   [phone] (f)

## 2022-09-25 NOTE — Telephone Encounter (Signed)
  Patient called and stated that Dr. Maxwell Caul is retired and she was advised to contact her cardiologists for CPAP referral and it needs to go to Dr. Claiborne Billings at [facility] [place]. Referral is same as her sister, per patient.

## 2022-09-26 DIAGNOSIS — M5416 Radiculopathy, lumbar region: Secondary | ICD-10-CM | POA: Diagnosis not present

## 2022-09-26 NOTE — Telephone Encounter (Signed)
The first referral was for her sister, she also wants a referral to Pulmonology as she also sees Dr. Maxwell Caul.

## 2022-09-26 NOTE — Telephone Encounter (Signed)
ICD-10-CM   1. OSA on CPAP  G47.33 Ambulatory referral to Pulmonology    2. Dyspnea on exertion  R06.09 Ambulatory referral to Pulmonology    3. Class 3 severe obesity due to excess calories without serious comorbidity with body mass index (BMI) of 40.0 to 44.9 in adult [facility])  E66.01 Ambulatory referral to Pulmonology   Z68.41      Orders Placed This Encounter  Procedures   Ambulatory referral to Pulmonology    Referral Priority:   Routine    Referral Type:   Consultation    Referral Reason:   Specialty Services Required    Requested Specialty:   Pulmonary Disease    Number of Visits Requested:   1    No orders of the defined types were placed in this encounter.

## 2022-09-27 ENCOUNTER — Other Ambulatory Visit: Payer: Self-pay | Admitting: Internal Medicine

## 2022-09-27 DIAGNOSIS — G4733 Obstructive sleep apnea (adult) (pediatric): Secondary | ICD-10-CM | POA: Insufficient documentation

## 2022-10-03 DIAGNOSIS — M5416 Radiculopathy, lumbar region: Secondary | ICD-10-CM | POA: Diagnosis not present

## 2022-10-08 DIAGNOSIS — M5416 Radiculopathy, lumbar region: Secondary | ICD-10-CM | POA: Diagnosis not present

## 2022-10-08 DIAGNOSIS — M5451 Vertebrogenic low back pain: Secondary | ICD-10-CM | POA: Diagnosis not present

## 2022-10-10 DIAGNOSIS — M5416 Radiculopathy, lumbar region: Secondary | ICD-10-CM | POA: Diagnosis not present

## 2022-10-15 DIAGNOSIS — M5451 Vertebrogenic low back pain: Secondary | ICD-10-CM | POA: Diagnosis not present

## 2022-10-15 DIAGNOSIS — M5416 Radiculopathy, lumbar region: Secondary | ICD-10-CM | POA: Diagnosis not present

## 2022-10-21 DIAGNOSIS — J029 Acute pharyngitis, unspecified: Secondary | ICD-10-CM | POA: Diagnosis not present

## 2022-10-21 DIAGNOSIS — R059 Cough, unspecified: Secondary | ICD-10-CM | POA: Diagnosis not present

## 2022-10-21 DIAGNOSIS — J40 Bronchitis, not specified as acute or chronic: Secondary | ICD-10-CM | POA: Diagnosis not present

## 2022-10-23 ENCOUNTER — Other Ambulatory Visit: Payer: Self-pay | Admitting: Endocrinology

## 2022-10-23 ENCOUNTER — Encounter: Payer: Medicare Other | Admitting: Internal Medicine

## 2022-10-29 ENCOUNTER — Encounter: Payer: Self-pay | Admitting: Pulmonary Disease

## 2022-10-29 ENCOUNTER — Ambulatory Visit (INDEPENDENT_AMBULATORY_CARE_PROVIDER_SITE_OTHER): Payer: Medicare Other | Admitting: Pulmonary Disease

## 2022-10-29 ENCOUNTER — Other Ambulatory Visit (INDEPENDENT_AMBULATORY_CARE_PROVIDER_SITE_OTHER): Payer: Medicare Other

## 2022-10-29 VITALS — BP 122/64 | HR 72 | Temp 98.4°F | Ht 63.0 in | Wt 228.2 lb

## 2022-10-29 DIAGNOSIS — G4733 Obstructive sleep apnea (adult) (pediatric): Secondary | ICD-10-CM | POA: Diagnosis not present

## 2022-10-29 DIAGNOSIS — E038 Other specified hypothyroidism: Secondary | ICD-10-CM

## 2022-10-29 DIAGNOSIS — R7303 Prediabetes: Secondary | ICD-10-CM

## 2022-10-29 LAB — T3, FREE: T3, Free: 3.8 pg/mL (ref 2.3–4.2)

## 2022-10-29 LAB — T4, FREE: Free T4: 1.03 ng/dL (ref 0.60–1.60)

## 2022-10-29 LAB — GLUCOSE, RANDOM: Glucose, Bld: 111 mg/dL — ABNORMAL HIGH (ref 70–99)

## 2022-10-29 LAB — HEMOGLOBIN A1C: Hgb A1c MFr Bld: 6.2 % (ref 4.6–6.5)

## 2022-10-29 LAB — TSH: TSH: 0.46 u[IU]/mL (ref 0.35–5.50)

## 2022-10-29 NOTE — Progress Notes (Signed)
Sherry Bell    [id number]    08-20-1946  Primary Care Physician:Jones, Arvid Right, MD  Referring Physician: Adrian Prows, MD [address],  [place] [address]  Chief complaint:   Patient being seen for obstructive sleep apnea  HPI:  Diagnosed with obstructive sleep apnea about 8 years ago, was following up with Dr. Maxwell Caul who recently retired  She feels overall better with CPAP use Current machine is about 74 years old  Remembers having been told she has severe obstructive sleep apnea  Usually goes to bed between 11 and 12, takes about 30 minutes to fall asleep 2-3 awakenings Final wake up time between 7 and 8  Sleep is nonrestorative  Usually wakes up to use the bathroom  Does have dryness of the mouth in the morning, no morning headaches  DME company is [employer]  History of hypertension, breast cancer,   Outpatient Encounter Medications as of 10/29/2022  Medication Sig   Ascorbic Acid (VITAMIN C PO) Take 400 mg by mouth daily.   B Complex Vitamins (VITAMIN B COMPLEX) CAPS Take by mouth. TAKE ONE DAILY   cholecalciferol (VITAMIN D) 1000 UNITS tablet Take 7,000 Units by mouth daily.    Magnesium 300 MG CAPS Take 1 capsule by mouth daily. Reported on 02/14/2016,TAKING 500 MG DAILY   meloxicam (MOBIC) 15 MG tablet Take 15 mg by mouth daily as needed.    nebivolol (BYSTOLIC) 10 MG tablet TAKE 1 TABLET BY MOUTH EVERY DAY   Omega-3 Fatty Acids (FISH OIL PO) Take by mouth. TAKE 2 DAILY   Sodium Sulfate-Mag Sulfate-KCl (SUTAB) [phone] MG TABS TAKE AS DIRECTED   spironolactone (ALDACTONE) 25 MG tablet TAKE 1 TABLET BY MOUTH EVERY DAY IN THE MORNING   SYNTHROID 125 MCG tablet TAKE 1 TABLET BY MOUTH EVERY DAY BEFORE BREAKFAST   liothyronine (CYTOMEL) 5 MCG tablet TAKE 2 TABLETS BY MOUTH DAILY (Patient not taking: Reported on 10/29/2022)   Semaglutide (RYBELSUS) 7 MG TABS Take 1 tablet by mouth daily before breakfast. Take 30 minutes before breakfast  with 4 oz. water (Patient not taking: Reported on 09/12/2022)   traMADol (ULTRAM) 50 MG tablet Take 50 mg by mouth 3 (three) times daily as needed. (Patient not taking: Reported on 09/12/2022)   No facility-administered encounter medications on file as of 10/29/2022.    Allergies as of 10/29/2022 - Review Complete 10/29/2022  Allergen Reaction Noted   Amlodipine Other (See Comments) 04/05/2021   Amoxicillin Nausea And Vomiting 06/05/2016   Pneumococcal 13-val conj vacc Other (See Comments) 08/15/2015   Prevnar 13 [pneumococcal 13-val conj vacc] Other (See Comments) 03/31/2021   Tetanus-diphth-acell pertussis Other (See Comments) 03/31/2021   Codeine Nausea And Vomiting, Rash, and Nausea Only 07/09/2011   Tetanus-diphth-acell pertussis Palpitations 08/15/2015    Past Medical History:  Diagnosis Date   Allergy    Anxiety    Arthritis    Cancer ([place])    BREAST RIGHT   Cataract    Cystocele    Fibroids    Gastric reflux    Generalized headaches    GERD (gastroesophageal reflux disease)    H. pylori infection    8 years ago   Heart murmur    Mild mitral regurgitation- pt denies this 07-02-16 at her [facility]   Hiatal hernia    Hypertension    Obstructive sleep apnea    uses CPAP   Pain in limb    Sleep apnea  uses CPAP   Thyroid disease    Tubular adenoma of colon    Varicose veins     Past Surgical History:  Procedure Laterality Date   BREAST SURGERY Right    SEPTEMBER 12 [place] PYLORI N/A 05/14/2016   Procedure: BREATH Eliezer Champagne;  Surgeon: Jerene Bears, MD;  Location: [facility];  Service: Gastroenterology;  Laterality: N/A;   COLONOSCOPY     Dr Lajoyce Corners   ESOPHAGOGASTRODUODENOSCOPY     Dr Lajoyce Corners   TONSILECTOMY, ADENOIDECTOMY, BILATERAL MYRINGOTOMY AND TUBES     UPPER GASTROINTESTINAL ENDOSCOPY      Family History  Problem Relation Age of Onset   Kidney disease Mother    Throat cancer Father        worked at [employer]   Hypothyroidism Sister     Atrial fibrillation Sister 43   Heart failure Brother    Hypertension Brother    Heart failure Brother    Hypertension Brother    Colon cancer Neg Hx    Esophageal cancer Neg Hx    Rectal cancer Neg Hx    Stomach cancer Neg Hx    Colon polyps Neg Hx    Uterine cancer Neg Hx    Ovarian cancer Neg Hx    Crohn's disease Neg Hx    Ulcerative colitis Neg Hx     Social History   Socioeconomic History   Marital status: Single    Spouse name: Not on file   Number of children: 1   Years of education: Not on file   Highest education level: Not on file  Occupational History   Occupation: hairdresser  Tobacco Use   Smoking status: Never    Passive exposure: Never   Smokeless tobacco: Never  Vaping Use   Vaping Use: Never used  Substance and Sexual Activity   Alcohol use: No   Drug use: Never   Sexual activity: Never  Other Topics Concern   Not on file  Social History Narrative   Not on file   Social Determinants of Health   Financial Resource Strain: Not on file  Food Insecurity: Not on file  Transportation Needs: Not on file  Physical Activity: Not on file  Stress: Not on file  Social Connections: Not on file  Intimate Partner Violence: Not on file    Review of Systems  Constitutional:  Negative for fatigue.  Respiratory:  Negative for shortness of breath.   Psychiatric/Behavioral:  Positive for sleep disturbance.     Vitals:   10/29/22 1524  BP: 122/64  Pulse: 72  Temp: 98.4 F (36.9 C)  SpO2: 97%     Physical Exam Constitutional:      Appearance: She is obese.  HENT:     Head: Normocephalic.     Mouth/Throat:     Mouth: Mucous membranes are moist.  Eyes:     Conjunctiva/sclera: Conjunctivae normal.  Cardiovascular:     Rate and Rhythm: Normal rate and regular rhythm.     Heart sounds: No murmur heard.    No friction rub.  Pulmonary:     Effort: No respiratory distress.     Breath sounds: No stridor. No wheezing or rhonchi.  Musculoskeletal:      Cervical back: No rigidity or tenderness.  Neurological:     Mental Status: She is alert.  Psychiatric:        Mood and Affect: Mood normal.    Data Reviewed: Previous sleep study  not available to be reviewed  Assessment:  History of obstructive sleep apnea Compliant with CPAP therapy  Nonrestorative sleep  Persistent dry mouth may be due to oral venting -Will send prescription to [facility] for a fullface mask  Plan/Recommendations: Obtain a download from [facility]  New mask to be tried as tolerated  Obtain copy of sleep study from Dr. Nancee Liter office  Tentative follow-up in about 6 months   Sherrilyn Rist MD [facility] 10/29/2022, 3:42 PM  CC: Adrian Prows, MD

## 2022-10-29 NOTE — Patient Instructions (Signed)
Record release from Dr. Nancee Liter office  CPAP compliance from [facility]  DME referral for new mask-fullface mask trial  Continue using CPAP nightly  I will see you in about 6 months  If possible, decrease water intake late into the evenings

## 2022-10-31 ENCOUNTER — Encounter: Payer: Self-pay | Admitting: Endocrinology

## 2022-10-31 ENCOUNTER — Ambulatory Visit (INDEPENDENT_AMBULATORY_CARE_PROVIDER_SITE_OTHER): Payer: Medicare Other | Admitting: Endocrinology

## 2022-10-31 VITALS — BP 110/62 | HR 63 | Ht 63.0 in | Wt 227.0 lb

## 2022-10-31 DIAGNOSIS — R7303 Prediabetes: Secondary | ICD-10-CM | POA: Diagnosis not present

## 2022-10-31 DIAGNOSIS — E041 Nontoxic single thyroid nodule: Secondary | ICD-10-CM

## 2022-10-31 DIAGNOSIS — E038 Other specified hypothyroidism: Secondary | ICD-10-CM

## 2022-10-31 NOTE — Progress Notes (Signed)
Patient ID: Sherry Bell, female   DOB: 08-21-1946, 76 y.o.   MRN: [id number]           Reason for Appointment: Follow-up of hypothyroidism     History of Present Illness:    HYPOTHYROIDISM:  This was diagnosed probably in the 1990s when the patient had gone to her physician because of weight gain. She does not think she had any unusual fatigue at that time She was found to be hypothyroid She does not think she felt any different with taking the thyroid supplements but her weight stabilized.  Her previous endocrinologist put her on combination of Synthroid and Cytomel 25 g, 1/2 tablet, this was prescribed twice a day  Subsequently she had been taking the Cytomel only in the morning since she would forget the evening dose Prior to her visit here she has been on 25 mcg of levothyroxine, using half tablet twice daily  Recent history:  On her initial consultation her liothyronine was switched to 10 mcg instead of 12.5 for better compliance Also levothyroxine had been progressively increased until 1/21  She tends to feel a little tired but this is not new She continues the brand-name Synthroid 125 mcg every morning She apparently went to a holistic physician and was told to take Cytomel twice a day instead of once a day She says she forgets to do this and is doing this only sometimes  Has not missed any doses  She does not feel any better with trying to take Cytomel twice a day  She has lost weight but has had more stress with recent diagnosis of breast cancer   Wt Readings from Last 3 Encounters:  10/31/22 227 lb (103 kg)  10/29/22 228 lb 3.2 oz (103.5 kg)  09/12/22 231 lb 9.6 oz (105.1 kg)    TSH is is now low normal, no significant change in free T4 but her T3 is higher  Lab Results  Component Value Date   FREET4 1.03 10/29/2022   FREET4 0.92 11/14/2021   FREET4 1.04 05/12/2021   TSH 0.46 10/29/2022   TSH 1.64 05/01/2022   TSH 2.97 11/14/2021   Lab Results   Component Value Date   T3FREE 3.8 10/29/2022   T3FREE 3.3 11/14/2021   T3FREE 2.9 05/12/2021    THYROID cyst:  The patient's thyroid nodule was first discovered in early 2016 soon after she had a respiratory infection in 11/2014 She had felt a swelling on the left side of her neck and was seen by her PCP for evaluation in 3/16 She did not have any swelling prior to that.  She does not feel that her neck swelling has increased in size and it may have moved more towards the center of the neck  The thyroid ultrasound showed the following Dominant simple appearing cyst occupies much of the left lobe and measures approximately 5.2 x 5.4 x 4.6 cm.  This cyst does not have any complex features or mural nodularity. On review of her previous records she had a predominantly cystic lesion in the upper pole of her thyroid measuring 1.7 cm in 2003 This was biopsied in 2002 and was benign Also she had a subcentimeter cyst in the lower left pole of the left side in 2003  She had a recurrence of the cyst in 2017 with local pressure symptoms Aspiration of this cyst was successfully done in June 2017 by Dr. Loanne Drilling and about 100 mL of fluid removed which was negative for cytology  In  the last few weeks she has felt a swelling in her left neck again without any discomfort, pressure, no difficulty swallowing or choking    Allergies as of 10/31/2022       Reactions   Amlodipine Other (See Comments)   Hair loss. Heart burn   Amoxicillin Nausea And Vomiting   Unknown   Pneumococcal 13-val Conj Vacc Other (See Comments)   Prevnar 13 [pneumococcal 13-val Conj Vacc] Other (See Comments)   Tetanus-diphth-acell Pertussis Other (See Comments)   Codeine Nausea And Vomiting, Rash, Nausea Only   Tetanus-diphth-acell Pertussis Palpitations        Medication List        Accurate as of October 31, 2022 10:21 AM. If you have any questions, ask your nurse or doctor.          cholecalciferol 1000  units tablet Commonly known as: VITAMIN D Take 7,000 Units by mouth daily.   FISH OIL PO Take by mouth. TAKE 2 DAILY   liothyronine 5 MCG tablet Commonly known as: CYTOMEL TAKE 2 TABLETS BY MOUTH DAILY   Magnesium 300 MG Caps Take 1 capsule by mouth daily. Reported on 02/14/2016,TAKING 500 MG DAILY   meloxicam 15 MG tablet Commonly known as: MOBIC Take 15 mg by mouth daily as needed.   nebivolol 10 MG tablet Commonly known as: BYSTOLIC TAKE 1 TABLET BY MOUTH EVERY DAY   Rybelsus 7 MG Tabs Generic drug: Semaglutide Take 1 tablet by mouth daily before breakfast. Take 30 minutes before breakfast with 4 oz. water   spironolactone 25 MG tablet Commonly known as: ALDACTONE TAKE 1 TABLET BY MOUTH EVERY DAY IN THE MORNING   Sutab [phone] MG Tabs Generic drug: Sodium Sulfate-Mag Sulfate-KCl TAKE AS DIRECTED   Synthroid 125 MCG tablet Generic drug: levothyroxine TAKE 1 TABLET BY MOUTH EVERY DAY BEFORE BREAKFAST   traMADol 50 MG tablet Commonly known as: ULTRAM Take 50 mg by mouth 3 (three) times daily as needed.   Vitamin B Complex Caps Take by mouth. TAKE ONE DAILY   VITAMIN C PO Take 400 mg by mouth daily.        Allergies:  Allergies  Allergen Reactions   Amlodipine Other (See Comments)    Hair loss. Heart burn   Amoxicillin Nausea And Vomiting    Unknown   Pneumococcal 13-Val Conj Vacc Other (See Comments)   Prevnar 13 [Pneumococcal 13-Val Conj Vacc] Other (See Comments)   Tetanus-Diphth-Acell Pertussis Other (See Comments)   Codeine Nausea And Vomiting, Rash and Nausea Only   Tetanus-Diphth-Acell Pertussis Palpitations    Past Medical History:  Diagnosis Date   Allergy    Anxiety    Arthritis    Cancer (HCC)    BREAST RIGHT   Cataract    Cystocele    Fibroids    Gastric reflux    Generalized headaches    GERD (gastroesophageal reflux disease)    H. pylori infection    8 years ago   Heart murmur    Mild mitral regurgitation- pt denies  this 07-02-16 at her [facility]   Hiatal hernia    Hypertension    Obstructive sleep apnea    uses CPAP   Pain in limb    Sleep apnea    uses CPAP   Thyroid disease    Tubular adenoma of colon    Varicose veins     There is no history of radiation to the neck in childhood  Past Surgical History:  Procedure Laterality Date  BREAST SURGERY Right    SEPTEMBER 12 [place] PYLORI N/A 05/14/2016   Procedure: BREATH Eliezer Champagne;  Surgeon: Jerene Bears, MD;  Location: [facility];  Service: Gastroenterology;  Laterality: N/A;   COLONOSCOPY     Dr Lajoyce Corners   ESOPHAGOGASTRODUODENOSCOPY     Dr Lajoyce Corners   TONSILECTOMY, ADENOIDECTOMY, BILATERAL MYRINGOTOMY AND TUBES     UPPER GASTROINTESTINAL ENDOSCOPY      Family History  Problem Relation Age of Onset   Kidney disease Mother    Throat cancer Father        worked at [employer]   Hypothyroidism Sister    Atrial fibrillation Sister 48   Heart failure Brother    Hypertension Brother    Heart failure Brother    Hypertension Brother    Colon cancer Neg Hx    Esophageal cancer Neg Hx    Rectal cancer Neg Hx    Stomach cancer Neg Hx    Colon polyps Neg Hx    Uterine cancer Neg Hx    Ovarian cancer Neg Hx    Crohn's disease Neg Hx    Ulcerative colitis Neg Hx     Social History:  reports that she has never smoked. She has never been exposed to tobacco smoke. She has never used smokeless tobacco. She reports that she does not drink alcohol and does not use drugs.   Review of Systems:     She has had sleep apnea diagnosed in 2013 using CPAP She is monitored by Dr. Maxwell Caul  HYPERTENSION: Taking amlodipine and Bystolic from cardiologist Blood pressure readings as follows       BP Readings from Last 3 Encounters:  10/31/22 110/62  10/29/22 122/64  08/20/22 124/68   PREDIABETES: She was evaluated by her PCP and had a A1c in the prediabetic range She had a postprandial reading of 121  Previously getting readings as high as 175  for her glucose postprandially using her granddaughters meter Now however her fasting glucose is 111  She was told to try Rybelsus but she was afraid of side effects Again since her last visit she has not tried the sample that was previously given Overall blood sugars are about the same   Lab Results  Component Value Date   HGBA1C 6.2 10/29/2022   HGBA1C 6.1 05/01/2022   HGBA1C 6.0 08/15/2021   Lab Results  Component Value Date   LDLCALC 101 10/05/2020   CREATININE 0.82 05/01/2022      Examination:   BP 110/62   Pulse 63   Ht '5\' 3"'$  (1.6 m)   Wt 227 lb (103 kg)   SpO2 98%   BMI 40.21 kg/m        She has a soft fullness of the isthmus and medial left lobe of the thyroid with the enlargement of the isthmus about twice normal and extending towards the left side No tenderness Right lobe not palpable   Assessment/Plan: 12/23  HYPOTHYROIDISM, longstanding:   She is on liothyronine 10 mcg and Synthroid 125 mcg  She has been taking extra 10 mcg at times of Cytomel in the afternoon With this her TSH is low normal but subjectively has not done any different  Recommend that she avoid taking extra Cytomel in the afternoon since her TSH is trending higher and previously did not need a higher dose  Recurrence of left-sided thyroid cyst, asymptomatic Discussed that since she is not symptomatic will wait till she is  having any pressure symptoms to send her for aspiration under ultrasound  PREDIABETES A1c about the same She is reluctant to take Rybelsus and can again be monitored with lifestyle changes Encouraged her to start an exercise program  Elayne Snare 10/31/2022

## 2022-10-31 NOTE — Patient Instructions (Signed)
Take cytomel 1x daily only

## 2022-11-04 ENCOUNTER — Other Ambulatory Visit: Payer: Medicare Other

## 2022-11-05 ENCOUNTER — Telehealth: Payer: Self-pay | Admitting: Internal Medicine

## 2022-11-05 NOTE — Telephone Encounter (Signed)
Left message for patient to call back to schedule Medicare Annual Wellness Visit   No hx of AWV eligible as of 08/26/21  Please schedule at anytime with [facility] Advisor if patient calls the office back.     Any questions, please call me at [phone]

## 2022-12-11 ENCOUNTER — Ambulatory Visit (AMBULATORY_SURGERY_CENTER): Payer: Medicare Other

## 2022-12-11 VITALS — Ht 63.0 in | Wt 230.0 lb

## 2022-12-11 DIAGNOSIS — Z86010 Personal history of colonic polyps: Secondary | ICD-10-CM

## 2022-12-11 DIAGNOSIS — K317 Polyp of stomach and duodenum: Secondary | ICD-10-CM

## 2022-12-11 DIAGNOSIS — Z8719 Personal history of other diseases of the digestive system: Secondary | ICD-10-CM

## 2022-12-11 NOTE — Progress Notes (Signed)

## 2022-12-19 ENCOUNTER — Encounter: Payer: Self-pay | Admitting: Internal Medicine

## 2022-12-19 ENCOUNTER — Ambulatory Visit (AMBULATORY_SURGERY_CENTER): Payer: Medicare Other | Admitting: Internal Medicine

## 2022-12-19 VITALS — BP 150/60 | HR 66 | Temp 97.3°F | Resp 13 | Ht 63.0 in | Wt 230.0 lb

## 2022-12-19 DIAGNOSIS — K621 Rectal polyp: Secondary | ICD-10-CM

## 2022-12-19 DIAGNOSIS — K219 Gastro-esophageal reflux disease without esophagitis: Secondary | ICD-10-CM | POA: Diagnosis not present

## 2022-12-19 DIAGNOSIS — K2970 Gastritis, unspecified, without bleeding: Secondary | ICD-10-CM | POA: Diagnosis not present

## 2022-12-19 DIAGNOSIS — G4733 Obstructive sleep apnea (adult) (pediatric): Secondary | ICD-10-CM | POA: Diagnosis not present

## 2022-12-19 DIAGNOSIS — Z8719 Personal history of other diseases of the digestive system: Secondary | ICD-10-CM

## 2022-12-19 DIAGNOSIS — E039 Hypothyroidism, unspecified: Secondary | ICD-10-CM | POA: Diagnosis not present

## 2022-12-19 DIAGNOSIS — I10 Essential (primary) hypertension: Secondary | ICD-10-CM | POA: Diagnosis not present

## 2022-12-19 DIAGNOSIS — D128 Benign neoplasm of rectum: Secondary | ICD-10-CM

## 2022-12-19 DIAGNOSIS — Z09 Encounter for follow-up examination after completed treatment for conditions other than malignant neoplasm: Secondary | ICD-10-CM

## 2022-12-19 DIAGNOSIS — Z86010 Personal history of colonic polyps: Secondary | ICD-10-CM

## 2022-12-19 MED ORDER — SODIUM CHLORIDE 0.9 % IV SOLN: 500.0000 mL | INTRAVENOUS | Status: DC

## 2022-12-19 NOTE — Progress Notes (Signed)
I have reviewed the patient's medical history in detail and updated the computerized patient record.

## 2022-12-19 NOTE — Progress Notes (Signed)
Called to room to assist during endoscopic procedure.  Patient ID and intended procedure confirmed with present staff. Received instructions for my participation in the procedure from the performing physician.

## 2022-12-19 NOTE — Progress Notes (Signed)
GASTROENTEROLOGY PROCEDURE H&P NOTE   Primary Care Physician: Jerene Bears, MD    Reason for Procedure:  History of GERD with esophagitis and hyperplastic gastric polyp and prior multiple adenomatous colon polyps  Plan:    EGD and colonoscopy  Patient is appropriate for endoscopic procedure(s) in the ambulatory ([place]) setting.  The nature of the procedure, as well as the risks, benefits, and alternatives were carefully and thoroughly reviewed with the patient. Ample time for discussion and questions allowed. The patient understood, was satisfied, and agreed to proceed.     HPI: Sherry Bell is a 77 y.o. female who presents for EGD and colonoscopy.  Medical history as below.  Tolerated the prep.  No recent chest pain or shortness of breath.  No abdominal pain today.  Past Medical History:  Diagnosis Date   Allergy    Arthritis    Cancer ([place])    BREAST RIGHT   Cataract    Cystocele    Fibroids    Gastric reflux    Generalized headaches    GERD (gastroesophageal reflux disease)    H. pylori infection    8 years ago   Heart murmur    Mild mitral regurgitation- pt denies this 07-02-16 at her [facility]   Hiatal hernia    Hypertension    Obstructive sleep apnea    uses CPAP   Pain in limb    Sleep apnea    uses CPAP   Thyroid disease    Tubular adenoma of colon    Varicose veins     Past Surgical History:  Procedure Laterality Date   BREAST SURGERY Right    SEPTEMBER 12 [place] 2023   BREATH TEK H PYLORI N/A 05/14/2016   Procedure: [place];  Surgeon: Jerene Bears, MD;  Location: [facility];  Service: Gastroenterology;  Laterality: N/A;   COLONOSCOPY     Dr Lajoyce Corners   ESOPHAGOGASTRODUODENOSCOPY     Dr Lajoyce Corners   TONSILECTOMY, ADENOIDECTOMY, [place]      Prior to Admission medications   Medication Sig Start Date End Date Taking? Authorizing Provider  Ascorbic Acid (VITAMIN C PO) Take 400 mg by mouth  daily.   Yes [provider]  B Complex Vitamins (VITAMIN B COMPLEX) CAPS Take by mouth. TAKE ONE DAILY   Yes [provider]  cholecalciferol (VITAMIN D) 1000 UNITS tablet Take 7,000 Units by mouth daily.    Yes [provider]  liothyronine (CYTOMEL) 5 MCG tablet TAKE 2 TABLETS BY MOUTH DAILY 07/02/22  Yes Elayne Snare, MD  Magnesium 300 MG CAPS Take 1 capsule by mouth daily. Reported on 02/14/2016,TAKING 500 MG DAILY   Yes [provider]  nebivolol (BYSTOLIC) 10 MG tablet Take 1 tablet by mouth daily.   Yes [provider]  Omega-3 Fatty Acids (FISH OIL PO) Take by mouth. TAKE 2 DAILY   Yes [provider]  Sodium Sulfate-Mag Sulfate-KCl (SUTAB) [phone] MG TABS TAKE AS DIRECTED 09/19/22  Yes Carri Spillers, Lajuan Lines, MD  spironolactone (ALDACTONE) 25 MG tablet TAKE 1 TABLET BY MOUTH EVERY DAY IN THE MORNING 02/01/22  Yes Cantwell, Celeste C, PA-C  SYNTHROID 125 MCG tablet TAKE 1 TABLET BY MOUTH EVERY DAY BEFORE BREAKFAST 10/23/22  Yes Elayne Snare, MD  meloxicam (MOBIC) 15 MG tablet Take 15 mg by mouth daily as needed.     [provider]    Current Outpatient Medications  Medication Sig Dispense Refill   Ascorbic Acid (VITAMIN C PO) Take 400 mg by mouth daily.     B Complex Vitamins (VITAMIN B COMPLEX) CAPS Take by mouth. TAKE ONE DAILY     cholecalciferol (VITAMIN D) 1000 UNITS tablet Take 7,000 Units by mouth daily.      liothyronine (CYTOMEL) 5 MCG tablet TAKE 2 TABLETS BY MOUTH DAILY 180 tablet 2   Magnesium 300 MG CAPS Take 1 capsule by mouth daily. Reported on 02/14/2016,TAKING 500 MG DAILY     nebivolol (BYSTOLIC) 10 MG tablet Take 1 tablet by mouth daily.     Omega-3 Fatty Acids (FISH OIL PO) Take by mouth. TAKE 2 DAILY     Sodium Sulfate-Mag Sulfate-KCl (SUTAB) [phone] MG TABS TAKE AS DIRECTED 24 tablet 0   spironolactone (ALDACTONE) 25 MG tablet TAKE 1 TABLET BY MOUTH EVERY DAY IN THE MORNING 90 tablet 3   SYNTHROID 125 MCG  tablet TAKE 1 TABLET BY MOUTH EVERY DAY BEFORE BREAKFAST 90 tablet 1   meloxicam (MOBIC) 15 MG tablet Take 15 mg by mouth daily as needed.      Current Facility-Administered Medications  Medication Dose Route Frequency Provider Last Rate Last Admin   0.9 %  sodium chloride infusion  500 mL Intravenous Continuous Sharnelle Cappelli, Lajuan Lines, MD        Allergies as of 12/19/2022 - Review Complete 12/19/2022  Allergen Reaction Noted   Amlodipine Other (See Comments) 04/05/2021   Amoxicillin Nausea And Vomiting 06/05/2016   Pneumococcal 13-val conj vacc Other (See Comments) 08/15/2015   Prevnar 13 [pneumococcal 13-val conj vacc] Other (See Comments) 03/31/2021   Tetanus-diphth-acell pertussis Other (See Comments) 03/31/2021   Codeine Nausea And Vomiting, Rash, and Nausea Only 07/09/2011   Tetanus-diphth-acell pertussis Palpitations 08/15/2015    Family History  Problem Relation Age of Onset   Kidney disease Mother    Throat cancer Father        worked at [employer]   Hypothyroidism Sister    Atrial fibrillation Sister 74   Heart failure Brother    Hypertension Brother    Heart failure Brother    Hypertension Brother    Colon cancer Neg Hx    Esophageal cancer Neg Hx    Rectal cancer Neg Hx    Stomach cancer Neg Hx    Colon polyps Neg Hx    Uterine cancer Neg Hx    Ovarian cancer Neg Hx    Crohn's disease Neg Hx    Ulcerative colitis Neg Hx     Social History   Socioeconomic History   Marital status: Single    Spouse name: Not on file   Number of children: 1   Years of education: Not on file   Highest education level: Not on file  Occupational History   Occupation: hairdresser  Tobacco Use   Smoking status: Never    Passive exposure: Never   Smokeless tobacco: Never  Vaping Use   Vaping Use: Never used  Substance and Sexual Activity   Alcohol use: No   Drug use: Never   Sexual activity: Never  Other Topics Concern   Not on file  Social History Narrative   Not on file    Social Determinants of Health   Financial Resource Strain: Not on file  Food Insecurity: Not on file  Transportation Needs: Not on file  Physical Activity: Not on file  Stress: Not on file  Social Connections: Not on file  Intimate Partner Violence: Not on  file    Physical Exam: Vital signs in last 24 hours: '@BP'$  (!) 184/67   Pulse 71   Temp (!) 97.3 F (36.3 C) (Skin)   Resp 15   Ht '5\' 3"'$  (1.6 m)   Wt 230 lb (104.3 kg)   SpO2 100%   BMI 40.74 kg/m  GEN: NAD EYE: Sclerae anicteric ENT: MMM CV: Non-tachycardic Pulm: CTA b/l GI: Soft, NT/ND NEURO:  Alert & Oriented x 3   Zenovia Jarred, MD [facility]  12/19/2022 11:18 AM

## 2022-12-19 NOTE — Op Note (Signed)
[place] Patient Name: Sherry Bell Procedure Date: 12/19/2022 11:01 AM MRN: [id number] Endoscopist: Jerene Bears , MD, [id number] Age: 77 Referring MD:  Date of Birth: 05/25/1946 Gender: Female Account #: [id number] Procedure:                Colonoscopy Indications:              High risk colon cancer surveillance: Personal                            history of multiple (3 or more) adenomas, Last                            colonoscopy: January 2019 Medicines:                Monitored Anesthesia Care Procedure:                Pre-Anesthesia Assessment:                           - Prior to the procedure, a History and Physical                            was performed, and patient medications and                            allergies were reviewed. The patient's tolerance of                            previous anesthesia was also reviewed. The risks                            and benefits of the procedure and the sedation                            options and risks were discussed with the patient.                            All questions were answered, and informed consent                            was obtained. Prior Anticoagulants: The patient has                            taken no anticoagulant or antiplatelet agents. ASA                            Grade Assessment: III - A patient with severe                            systemic disease. After reviewing the risks and                            benefits, the patient was deemed in satisfactory  condition to undergo the procedure.                           After obtaining informed consent, the colonoscope                            was passed under direct vision. Throughout the                            procedure, the patient's blood pressure, pulse, and                            oxygen saturations were monitored continuously. The                            CF [id number] #[id number] was introduced  through the anus                            and advanced to the cecum, identified by                            appendiceal orifice and ileocecal valve. The                            colonoscopy was performed without difficulty. The                            patient tolerated the procedure well. The quality                            of the bowel preparation was good. The ileocecal                            valve, appendiceal orifice, and rectum were                            photographed. Scope In: 11:27:18 AM Scope Out: 11:41:13 AM Scope Withdrawal Time: 0 hours 10 minutes 55 seconds  Total Procedure Duration: 0 hours 13 minutes 55 seconds  Findings:                 The digital rectal exam was normal.                           A 4 mm polyp was found in the rectum. The polyp was                            sessile. The polyp was removed with a cold snare.                            Resection and retrieval were complete.                           The exam was otherwise without abnormality on  direct and retroflexion views. Complications:            No immediate complications. Estimated Blood Loss:     Estimated blood loss was minimal. Impression:               - One 4 mm polyp in the rectum, removed with a cold                            snare. Resected and retrieved.                           - The examination was otherwise normal on direct                            and retroflexion views. Recommendation:           - Patient has a contact number available for                            emergencies. The signs and symptoms of potential                            delayed complications were discussed with the                            patient. Return to normal activities tomorrow.                            Written discharge instructions were provided to the                            patient.                           - Resume previous diet.                            - Continue present medications.                           - Await pathology results.                           - No repeat colonoscopy due to age at next                            surveillance interval. Jerene Bears, MD 12/19/2022 11:46:25 AM This report has been signed electronically.

## 2022-12-19 NOTE — Progress Notes (Signed)
Report to PACU, RN, vss, BBS= Clear.  

## 2022-12-19 NOTE — Op Note (Signed)
[facility] Patient Name: Sherry Bell Procedure Date: 12/19/2022 11:02 AM MRN: [id number] Endoscopist: Jerene Bears , MD, [id number] Age: 77 Referring MD:  Date of Birth: 1946/09/25 Gender: Female Account #: [id number] Procedure:                Upper GI endoscopy Indications:              Gastro-esophageal reflux disease, Follow-up of                            gastric polyps (hyperplastic gastric polyp) Medicines:                Monitored Anesthesia Care Procedure:                Pre-Anesthesia Assessment:                           - Prior to the procedure, a History and Physical                            was performed, and patient medications and                            allergies were reviewed. The patient's tolerance of                            previous anesthesia was also reviewed. The risks                            and benefits of the procedure and the sedation                            options and risks were discussed with the patient.                            All questions were answered, and informed consent                            was obtained. Prior Anticoagulants: The patient has                            taken no anticoagulant or antiplatelet agents. ASA                            Grade Assessment: III - A patient with severe                            systemic disease. After reviewing the risks and                            benefits, the patient was deemed in satisfactory                            condition to undergo the procedure.  After obtaining informed consent, the endoscope was                            passed under direct vision. Throughout the                            procedure, the patient's blood pressure, pulse, and                            oxygen saturations were monitored continuously. The                            Olympus Scope [id number] was introduced through the                            mouth, and  advanced to the second part of duodenum.                            The upper GI endoscopy was accomplished without                            difficulty. The patient tolerated the procedure                            well. Scope In: Scope Out: Findings:                 The examined esophagus was normal.                           Scattered moderate inflammation characterized by                            erosions, erythema and granularity was found in the                            gastric antrum. Biopsies were taken with a cold                            forceps for histology and Helicobacter pylori                            testing.                           The examined duodenum was normal. Complications:            No immediate complications. Estimated Blood Loss:     Estimated blood loss was minimal. Impression:               - Normal esophagus.                           - Gastritis. Biopsied.                           - Normal examined duodenum. Recommendation:           -  Patient has a contact number available for                            emergencies. The signs and symptoms of potential                            delayed complications were discussed with the                            patient. Return to normal activities tomorrow.                            Written discharge instructions were provided to the                            patient.                           - Resume previous diet.                           - Continue present medications.                           - Await pathology results. Jerene Bears, MD 12/19/2022 11:44:27 AM This report has been signed electronically.

## 2022-12-19 NOTE — Patient Instructions (Signed)
Handout on polyps given.  Resume previous diet and continue current medications.   YOU HAD AN ENDOSCOPIC PROCEDURE TODAY AT [facility]:   Refer to the procedure report that was given to you for any specific questions about what was found during the examination.  If the procedure report does not answer your questions, please call your gastroenterologist to clarify.  If you requested that your care partner not be given the details of your procedure findings, then the procedure report has been included in a sealed envelope for you to review at your convenience later.  YOU SHOULD EXPECT: Some feelings of bloating in the abdomen. Passage of more gas than usual.  Walking can help get rid of the air that was put into your GI tract during the procedure and reduce the bloating. If you had a lower endoscopy (such as a colonoscopy or flexible sigmoidoscopy) you may notice spotting of blood in your stool or on the toilet paper. If you underwent a bowel prep for your procedure, you may not have a normal bowel movement for a few days.  Please Note:  You might notice some irritation and congestion in your nose or some drainage.  This is from the oxygen used during your procedure.  There is no need for concern and it should clear up in a day or so.  SYMPTOMS TO REPORT IMMEDIATELY:  Following lower endoscopy (colonoscopy or flexible sigmoidoscopy):  Excessive amounts of blood in the stool  Significant tenderness or worsening of abdominal pains  Swelling of the abdomen that is new, acute  Fever of 100F or higher  Following upper endoscopy (EGD)  Vomiting of blood or coffee ground material  New chest pain or pain under the shoulder blades  Painful or persistently difficult swallowing  New shortness of breath  Fever of 100F or higher  Black, tarry-looking stools  For urgent or emergent issues, a gastroenterologist can be reached at any hour by calling [phone]. Do not use MyChart  messaging for urgent concerns.    DIET:  We do recommend a small meal at first, but then you may proceed to your regular diet.  Drink plenty of fluids but you should avoid alcoholic beverages for 24 hours.  ACTIVITY:  You should plan to take it easy for the rest of today and you should NOT DRIVE or use heavy machinery until tomorrow (because of the sedation medicines used during the test).    FOLLOW UP: Our staff will call the number listed on your records the next business day following your procedure.  We will call around 7:15- 8:00 am to check on you and address any questions or concerns that you may have regarding the information given to you following your procedure. If we do not reach you, we will leave a message.     If any biopsies were taken you will be contacted by phone or by letter within the next 1-3 weeks.  Please call us at [phone] if you have not heard about the biopsies in 3 weeks.    SIGNATURES/CONFIDENTIALITY: You and/or your care partner have signed paperwork which will be entered into your electronic medical record.  These signatures attest to the fact that that the information above on your After Visit Summary has been reviewed and is understood.  Full responsibility of the confidentiality of this discharge information lies with you and/or your care-partner.

## 2022-12-20 ENCOUNTER — Telehealth: Payer: Self-pay | Admitting: *Deleted

## 2022-12-20 NOTE — Telephone Encounter (Signed)
  Follow up Call-     12/19/2022   10:29 AM  Call back number  Post procedure Call Back phone  # [phone]  Permission to leave phone message Yes     Patient questions:  Do you have a fever, pain , or abdominal swelling? No. Pain Score  0 *  Have you tolerated food without any problems? Yes.    Have you been able to return to your normal activities? Yes.    Do you have any questions about your discharge instructions: Diet   No. Medications  No. Follow up visit  No.  Do you have questions or concerns about your Care? No.  Actions: * If pain score is 4 or above: No action needed, pain <4.

## 2022-12-25 ENCOUNTER — Encounter: Payer: Self-pay | Admitting: Internal Medicine

## 2022-12-26 ENCOUNTER — Telehealth: Payer: Self-pay | Admitting: Internal Medicine

## 2022-12-26 NOTE — Telephone Encounter (Signed)
PT is calling to get results of colonoscopy/egd done on 12/19/22. Please advise

## 2022-12-26 NOTE — Telephone Encounter (Signed)
Let pt know that the path letters were mailed yesterday. Pt aware of results per Dr. Hilarie Fredrickson.

## 2023-01-07 DIAGNOSIS — E7800 Pure hypercholesterolemia, unspecified: Secondary | ICD-10-CM | POA: Diagnosis not present

## 2023-01-07 DIAGNOSIS — G4733 Obstructive sleep apnea (adult) (pediatric): Secondary | ICD-10-CM | POA: Diagnosis not present

## 2023-01-07 DIAGNOSIS — Z853 Personal history of malignant neoplasm of breast: Secondary | ICD-10-CM | POA: Diagnosis not present

## 2023-01-07 DIAGNOSIS — R7301 Impaired fasting glucose: Secondary | ICD-10-CM | POA: Diagnosis not present

## 2023-01-07 DIAGNOSIS — E559 Vitamin D deficiency, unspecified: Secondary | ICD-10-CM | POA: Diagnosis not present

## 2023-01-07 DIAGNOSIS — I10 Essential (primary) hypertension: Secondary | ICD-10-CM | POA: Diagnosis not present

## 2023-01-07 DIAGNOSIS — G8929 Other chronic pain: Secondary | ICD-10-CM | POA: Diagnosis not present

## 2023-01-07 DIAGNOSIS — E8881 Metabolic syndrome: Secondary | ICD-10-CM | POA: Diagnosis not present

## 2023-01-07 DIAGNOSIS — R7303 Prediabetes: Secondary | ICD-10-CM | POA: Diagnosis not present

## 2023-01-07 DIAGNOSIS — E039 Hypothyroidism, unspecified: Secondary | ICD-10-CM | POA: Diagnosis not present

## 2023-01-21 DIAGNOSIS — H04123 Dry eye syndrome of bilateral lacrimal glands: Secondary | ICD-10-CM | POA: Diagnosis not present

## 2023-01-21 DIAGNOSIS — H524 Presbyopia: Secondary | ICD-10-CM | POA: Diagnosis not present

## 2023-01-21 DIAGNOSIS — H35363 Drusen (degenerative) of macula, bilateral: Secondary | ICD-10-CM | POA: Diagnosis not present

## 2023-01-21 DIAGNOSIS — H2513 Age-related nuclear cataract, bilateral: Secondary | ICD-10-CM | POA: Diagnosis not present

## 2023-01-21 DIAGNOSIS — H43813 Vitreous degeneration, bilateral: Secondary | ICD-10-CM | POA: Diagnosis not present

## 2023-01-30 DIAGNOSIS — Z9011 Acquired absence of right breast and nipple: Secondary | ICD-10-CM | POA: Diagnosis not present

## 2023-01-30 DIAGNOSIS — R92321 Mammographic fibroglandular density, right breast: Secondary | ICD-10-CM | POA: Diagnosis not present

## 2023-01-30 DIAGNOSIS — Z853 Personal history of malignant neoplasm of breast: Secondary | ICD-10-CM | POA: Diagnosis not present

## 2023-01-30 DIAGNOSIS — R922 Inconclusive mammogram: Secondary | ICD-10-CM | POA: Diagnosis not present

## 2023-02-13 DIAGNOSIS — C50911 Malignant neoplasm of unspecified site of right female breast: Secondary | ICD-10-CM | POA: Diagnosis not present

## 2023-02-13 DIAGNOSIS — Z9889 Other specified postprocedural states: Secondary | ICD-10-CM | POA: Diagnosis not present

## 2023-02-26 ENCOUNTER — Telehealth: Payer: Self-pay | Admitting: Endocrinology

## 2023-02-26 NOTE — Telephone Encounter (Signed)
Please advise 

## 2023-02-26 NOTE — Telephone Encounter (Signed)
Patient called to advise that the swelling in her throat is worse and that she needs to either see Dr Dwyane Dee and/or get the ultrasound that was mentioned at her list visit(s).  Call back # [phone]

## 2023-02-27 NOTE — Telephone Encounter (Signed)
Message left on patient's voice mail to call office to schedule follow up appointment with Dr. Candace Gallus labs per provider.

## 2023-03-06 ENCOUNTER — Ambulatory Visit (INDEPENDENT_AMBULATORY_CARE_PROVIDER_SITE_OTHER): Payer: Medicare Other | Admitting: Endocrinology

## 2023-03-06 ENCOUNTER — Encounter: Payer: Self-pay | Admitting: Endocrinology

## 2023-03-06 DIAGNOSIS — R7303 Prediabetes: Secondary | ICD-10-CM | POA: Diagnosis not present

## 2023-03-06 DIAGNOSIS — E038 Other specified hypothyroidism: Secondary | ICD-10-CM

## 2023-03-06 LAB — T4, FREE: Free T4: 0.96 ng/dL (ref 0.60–1.60)

## 2023-03-06 LAB — GLUCOSE, RANDOM: Glucose, Bld: 110 mg/dL — ABNORMAL HIGH (ref 70–99)

## 2023-03-06 LAB — TSH: TSH: 0.71 u[IU]/mL (ref 0.35–5.50)

## 2023-03-06 NOTE — Progress Notes (Signed)
Patient ID: Sherry DevoidMunirah N Heron, female   DOB: [birth year], 77 y.o.   MRN: [id number]           Reason for Appointment: Follow-up of hypothyroidism     History of Present Illness:    HYPOTHYROIDISM:  This was diagnosed probably in the 1990s when the patient had gone to her physician because of weight gain. She does not think she had any unusual fatigue at that time She was found to be hypothyroid She does not think she felt any different with taking the thyroid supplements but her weight stabilized.  Her previous endocrinologist put her on combination of Synthroid and Cytomel 25 g, 1/2 tablet, this was prescribed twice a day  Subsequently she had been taking the Cytomel only in the morning since she would forget the evening dose Prior to her visit here she has been on 25 mcg of levothyroxine, using half tablet twice daily  Recent history:  On her initial consultation her liothyronine was switched to 10 mcg instead of 12.5 for better compliance Also levothyroxine had been progressively increased until 1/21  She continues the brand-name Synthroid 125 mcg every morning Again she is forgetting to take the liothyronine twice a day and mostly taking it once a day, she tries to take the second dose around 2 PM but she thinks she misses it about 2 or 3 days a week at least  She thinks when she takes her second dose she may feel a little less tired No recent weight change  Wt Readings from Last 3 Encounters:  10/31/22 227 lb (103 kg)  10/29/22 228 lb 3.2 oz (103.5 kg)  09/12/22 231 lb 9.6 oz (105.1 kg)    TSH is pending  Lab Results  Component Value Date   FREET4 1.03 10/29/2022   FREET4 0.92 11/14/2021   FREET4 1.04 05/12/2021   TSH 0.46 10/29/2022   TSH 1.64 05/01/2022   TSH 2.97 11/14/2021   Lab Results  Component Value Date   T3FREE 3.8 10/29/2022   T3FREE 3.3 11/14/2021   T3FREE 2.9 05/12/2021    THYROID cyst:  She came in today because of her perception that the  thyroid cyst is larger on observation However she has no difficulty swallowing, no pressure or discomfort locally  History: She had a predominantly cystic lesion in the upper pole of her thyroid measuring 1.7 cm in 2003 This was biopsied in 2002 and was benign Also she had a subcentimeter cyst in the lower left pole of the left side in 2003  She had a recurrence of the cyst in 2017 with local pressure symptoms Aspiration of this cyst was successfully done in June 2017 by Dr. Everardo AllEllison and about 100 mL of fluid removed which was negative for cytology  The thyroid ultrasound in 2016 showed the following Dominant simple appearing cyst occupies much of the left lobe and measures approximately 5.2 x 5.4 x 4.6 cm.  This cyst does not have any complex features or mural nodularity.    Allergies as of 10/31/2022       Reactions   Amlodipine Other (See Comments)   Hair loss. Heart burn   Amoxicillin Nausea And Vomiting   Unknown   Pneumococcal 13-val Conj Vacc Other (See Comments)   Prevnar 13 [pneumococcal 13-val Conj Vacc] Other (See Comments)   Tetanus-diphth-acell Pertussis Other (See Comments)   Codeine Nausea And Vomiting, Rash, Nausea Only   Tetanus-diphth-acell Pertussis Palpitations        Medication List  Accurate as of October 31, 2022 10:21 AM. If you have any questions, ask your nurse or doctor.          cholecalciferol 1000 units tablet Commonly known as: VITAMIN D Take 7,000 Units by mouth daily.   FISH OIL PO Take by mouth. TAKE 2 DAILY   liothyronine 5 MCG tablet Commonly known as: CYTOMEL TAKE 2 TABLETS BY MOUTH DAILY   Magnesium 300 MG Caps Take 1 capsule by mouth daily. Reported on 02/14/2016,TAKING 500 MG DAILY   meloxicam 15 MG tablet Commonly known as: MOBIC Take 15 mg by mouth daily as needed.   nebivolol 10 MG tablet Commonly known as: BYSTOLIC TAKE 1 TABLET BY MOUTH EVERY DAY   Rybelsus 7 MG Tabs Generic drug: Semaglutide Take 1  tablet by mouth daily before breakfast. Take 30 minutes before breakfast with 4 oz. water   spironolactone 25 MG tablet Commonly known as: ALDACTONE TAKE 1 TABLET BY MOUTH EVERY DAY IN THE MORNING   Sutab [id number] MG Tabs Generic drug: Sodium Sulfate-Mag Sulfate-KCl TAKE AS DIRECTED   Synthroid 125 MCG tablet Generic drug: levothyroxine TAKE 1 TABLET BY MOUTH EVERY DAY BEFORE BREAKFAST   traMADol 50 MG tablet Commonly known as: ULTRAM Take 50 mg by mouth 3 (three) times daily as needed.   Vitamin B Complex Caps Take by mouth. TAKE ONE DAILY   VITAMIN C PO Take 400 mg by mouth daily.        Allergies:  Allergies  Allergen Reactions   Amlodipine Other (See Comments)    Hair loss. Heart burn   Amoxicillin Nausea And Vomiting    Unknown   Pneumococcal 13-Val Conj Vacc Other (See Comments)   Prevnar 13 [Pneumococcal 13-Val Conj Vacc] Other (See Comments)   Tetanus-Diphth-Acell Pertussis Other (See Comments)   Codeine Nausea And Vomiting, Rash and Nausea Only   Tetanus-Diphth-Acell Pertussis Palpitations    Past Medical History:  Diagnosis Date   Allergy    Anxiety    Arthritis    Cancer (HCC)    BREAST RIGHT   Cataract    Cystocele    Fibroids    Gastric reflux    Generalized headaches    GERD (gastroesophageal reflux disease)    H. pylori infection    8 years ago   Heart murmur    Mild mitral regurgitation- pt denies this 07-02-16 at her [facility]   Hiatal hernia    Hypertension    Obstructive sleep apnea    uses CPAP   Pain in limb    Sleep apnea    uses CPAP   Thyroid disease    Tubular adenoma of colon    Varicose veins     There is no history of radiation to the neck in childhood  Past Surgical History:  Procedure Laterality Date   BREAST SURGERY Right    SEPTEMBER 12 TH 2023   BREATH TEK H PYLORI N/A 05/14/2016   Procedure: BREATH Nelda Bucks;  Surgeon: Beverley Fiedler, MD;  Location: [facility];  Service: Gastroenterology;  Laterality: N/A;    COLONOSCOPY     Dr Virginia Rochester   ESOPHAGOGASTRODUODENOSCOPY     Dr Theodoro Clock, ADENOIDECTOMY, BILATERAL MYRINGOTOMY AND TUBES     UPPER GASTROINTESTINAL ENDOSCOPY      Family History  Problem Relation Age of Onset   Kidney disease Mother    Throat cancer Father        worked at [employer]   Hypothyroidism Sister  Atrial fibrillation Sister 41   Heart failure Brother    Hypertension Brother    Heart failure Brother    Hypertension Brother    Colon cancer Neg Hx    Esophageal cancer Neg Hx    Rectal cancer Neg Hx    Stomach cancer Neg Hx    Colon polyps Neg Hx    Uterine cancer Neg Hx    Ovarian cancer Neg Hx    Crohn's disease Neg Hx    Ulcerative colitis Neg Hx     Social History:  reports that she has never smoked. She has never been exposed to tobacco smoke. She has never used smokeless tobacco. She reports that she does not drink alcohol and does not use drugs.   Review of Systems:     She has had sleep apnea diagnosed in 2013 using CPAP She is monitored by Dr. Earl Gala  HYPERTENSION: Taking amlodipine and Bystolic from cardiologist Blood pressure readings as follows       BP Readings from Last 3 Encounters:  10/31/22 110/62  10/29/22 122/64  08/20/22 124/68   PREDIABETES: She was evaluated by her PCP and had a A1c in the prediabetic range She had a postprandial reading of 121  No recent labs available  She was told to try Rybelsus but she was afraid of side effects Weight is about the same as recently   Lab Results  Component Value Date   HGBA1C 6.2 10/29/2022   HGBA1C 6.1 05/01/2022   HGBA1C 6.0 08/15/2021   Lab Results  Component Value Date   LDLCALC 101 10/05/2020   CREATININE 0.82 05/01/2022      Examination:   BP 110/62   Pulse 63   Ht 5\' 3"  (1.6 m)   Wt 227 lb (103 kg)   SpO2 98%   BMI 40.21 kg/m        She has a soft swelling of the left lobe of the thyroid, mostly medially and about twice normal overall  This extends to  the isthmus which is also enlarged No specific nodule felt  Right lobe not palpable Pemberton sign negative No stridor on auscultation  Assessment/Plan:   HYPOTHYROIDISM, longstanding:   She is on liothyronine 10 mcg and Synthroid 125 mcg  She has been taking extra 10 mcg at times of Cytomel in the afternoon Labs pending  Recurrence of left-sided thyroid cyst, asymptomatic Although she subjectively felt that the cyst was larger today's exam does not indicate any change from the last visit in December  Discussed that since she is not symptomatic will wait till she is having any pressure symptoms to send her for aspiration under ultrasound  PREDIABETES To be followed up at a future visit  Reather Littler 10/31/2022  Addendum: TSH is normal and she will continue the same regimen Random glucose 110 which is acceptable for the time of the day

## 2023-03-17 IMAGING — US US PELVIS COMPLETE WITH TRANSVAGINAL
1 series · 13 of 25 positions shown · non-contrast
Comparison: None

CLINICAL DATA: Fibroid uterus, postmenopausal



[Series 1: us pelvis complete with transvaginal · 78 acquisitions, 13 frames shown]
[im 1/78]
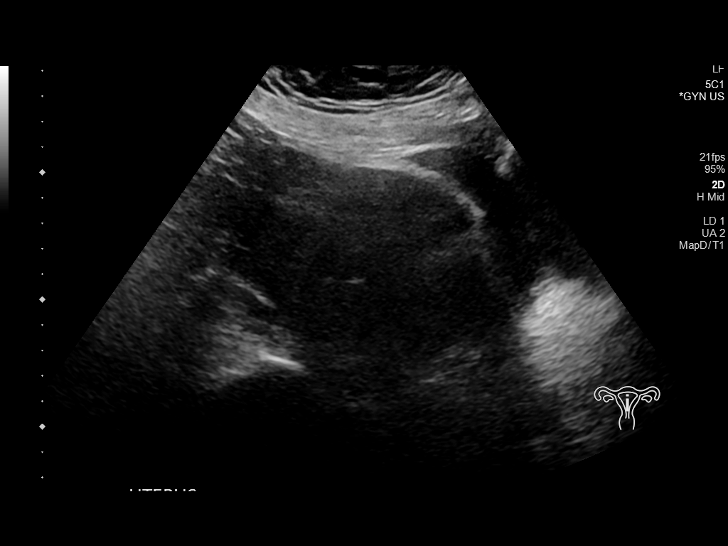
[im 7/78]
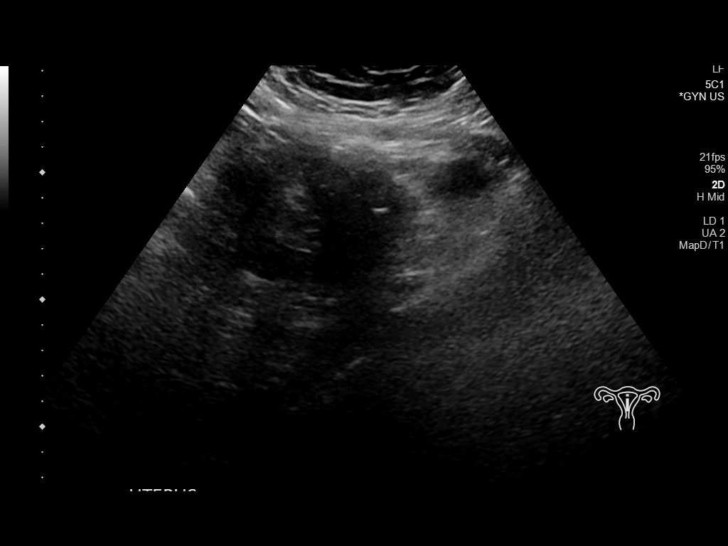
[im 13/78]
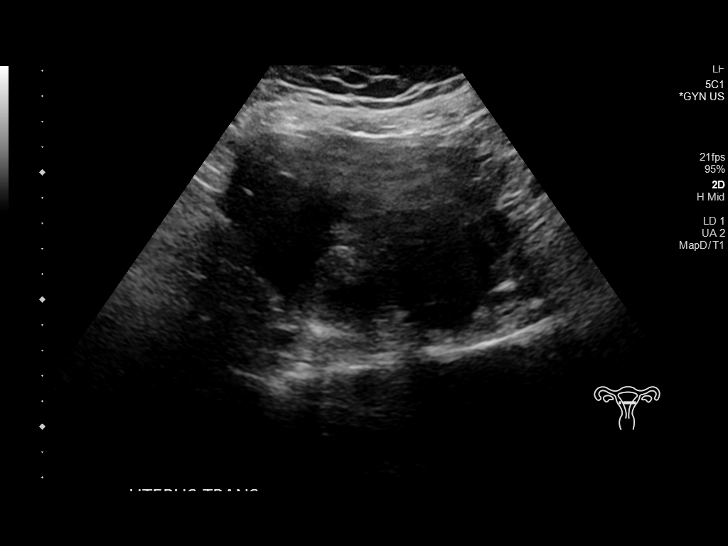
[im 20/78]
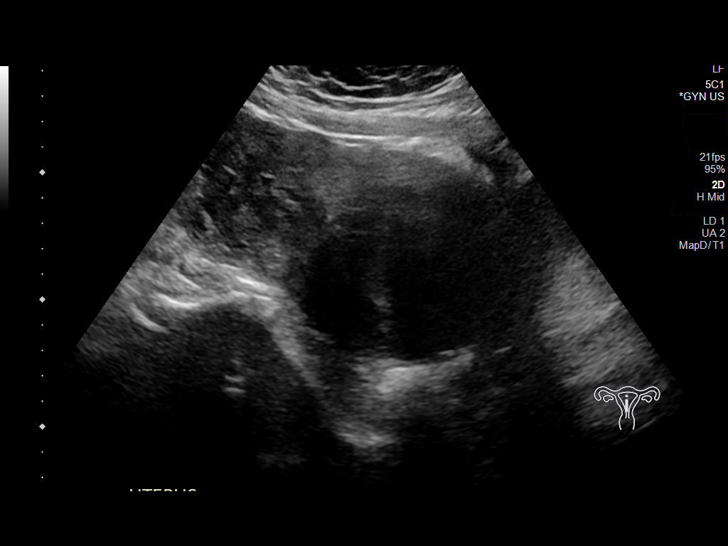
[im 26/78]
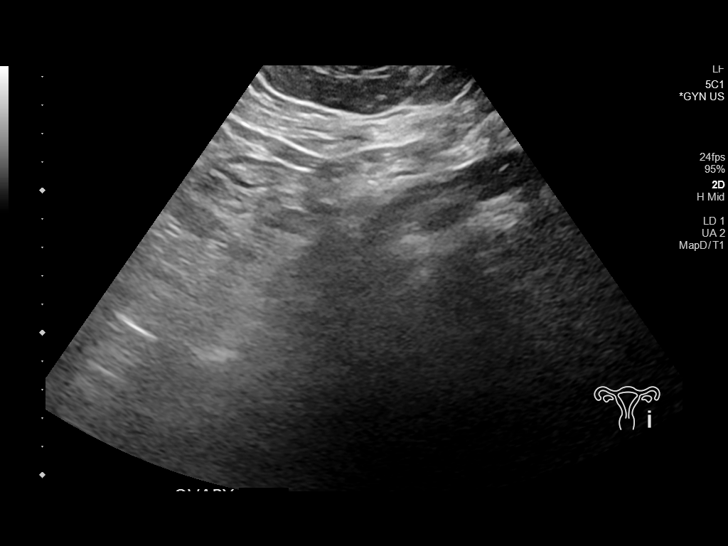
[im 33/78]
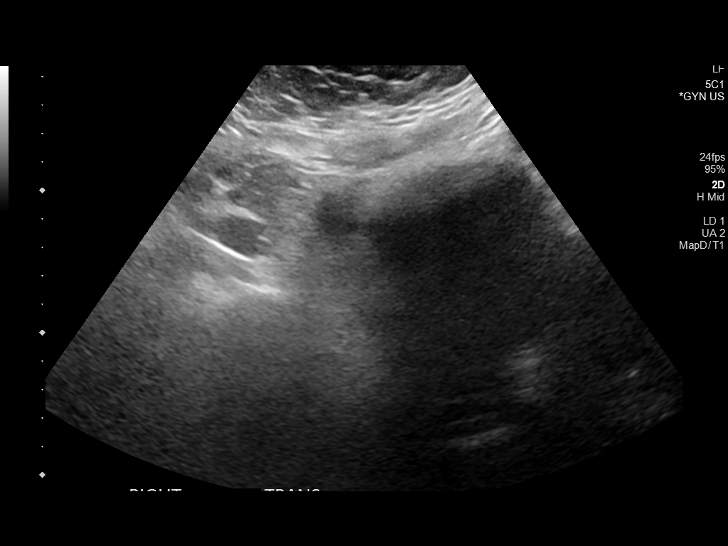
[im 39/78]
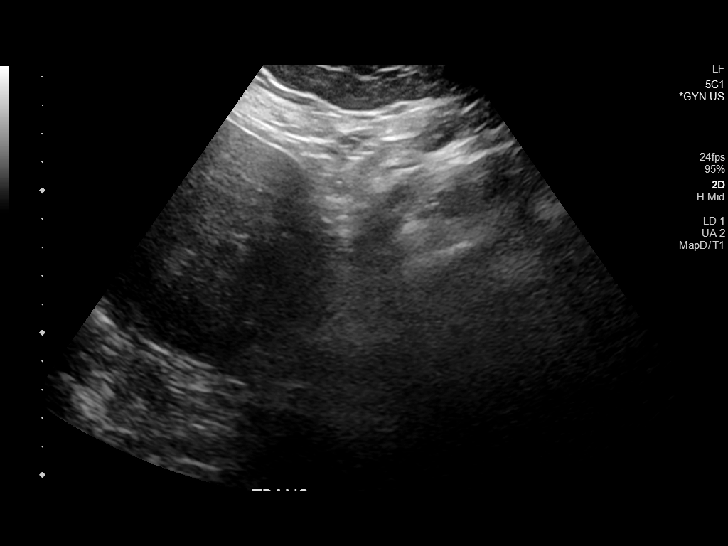
[im 45/78]
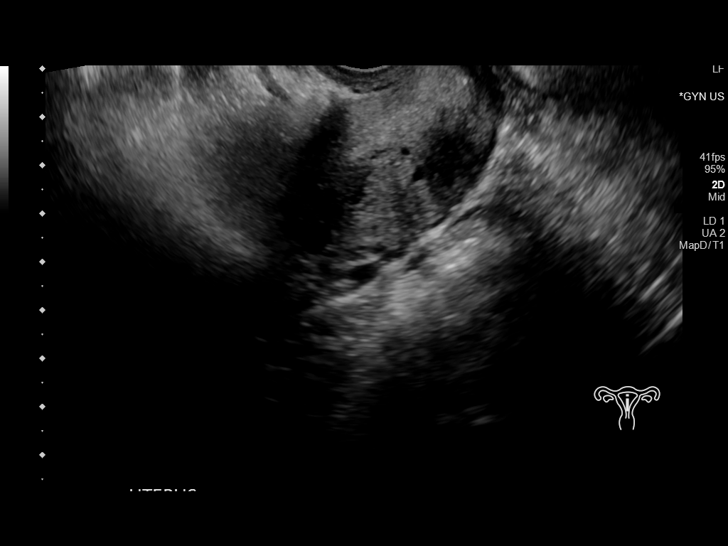
[im 52/78]
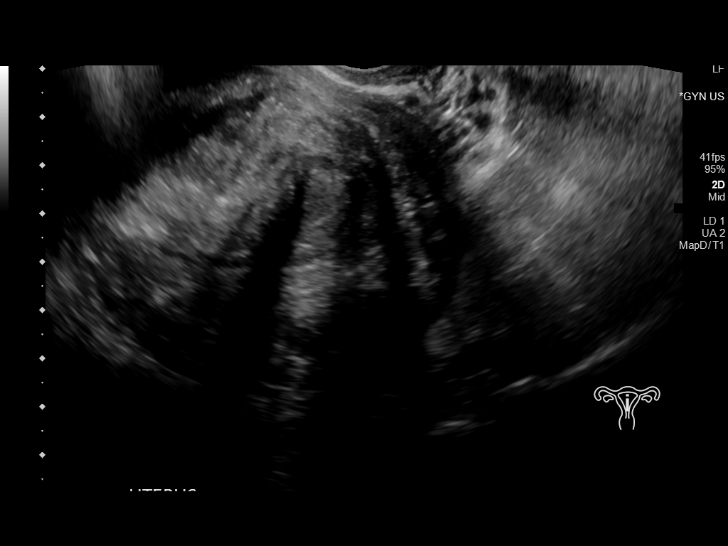
[im 58/78]
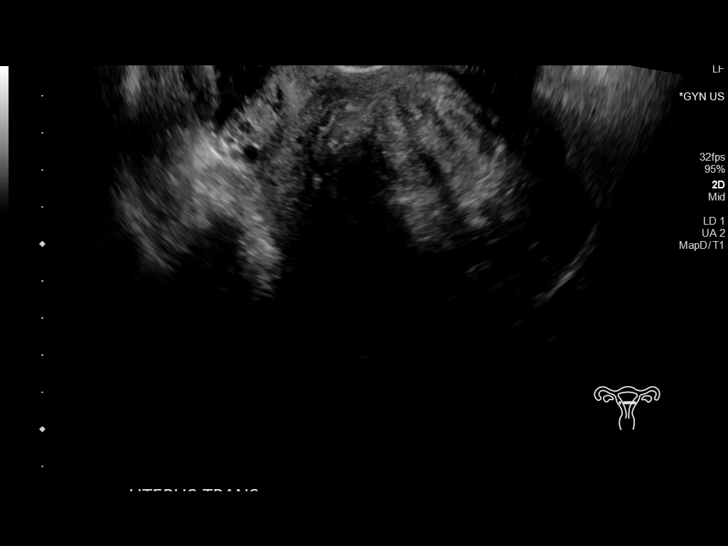
[im 65/78]
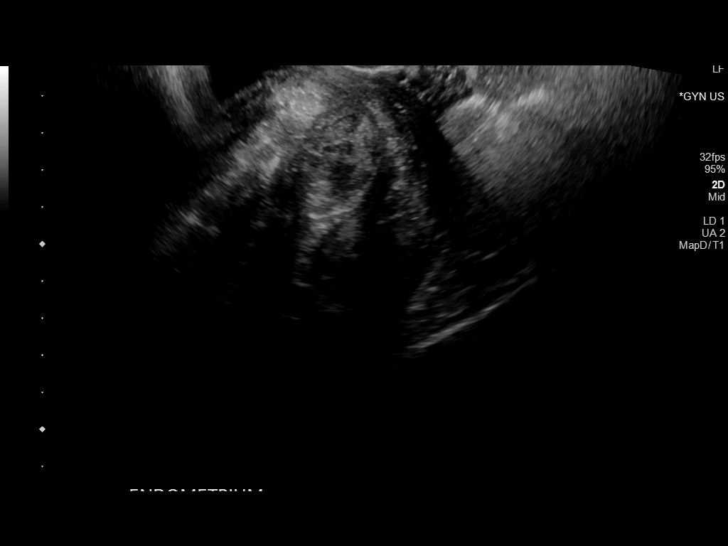
[im 71/78]
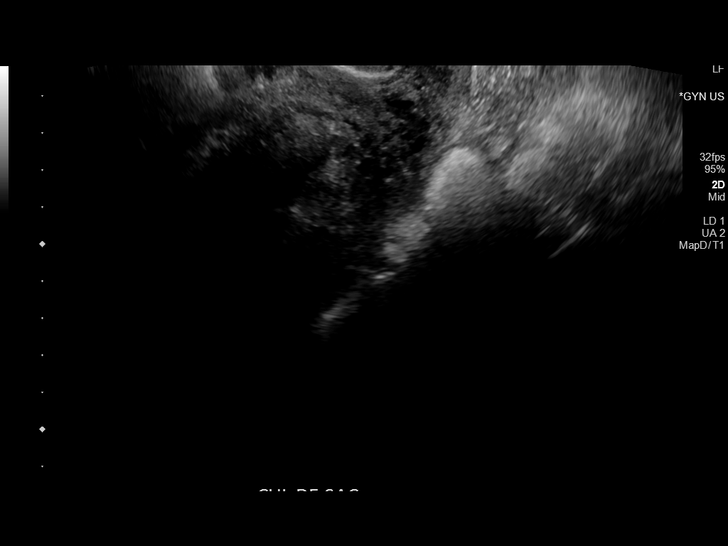
[im 78/78]
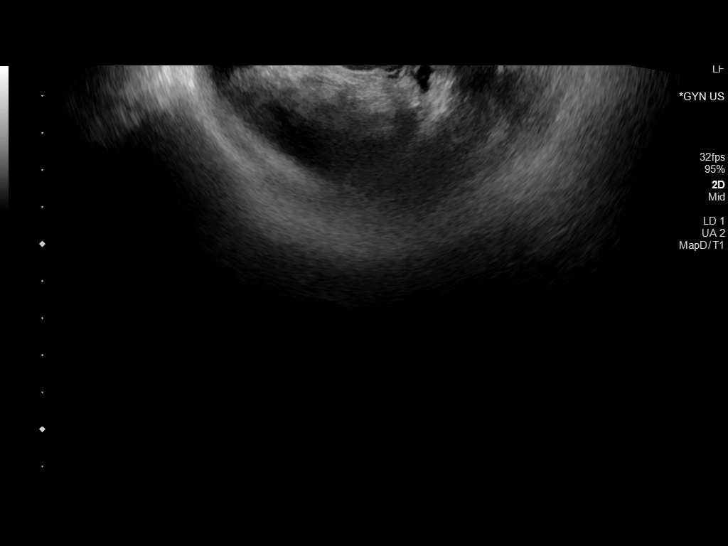

[13 of 25 positions shown; findings below may reference images not displayed]

FINDINGS: Uterus

Measurements: 14.5 x 4.9 x 11.0 cm = volume: 414 mL. Anteverted.
Large mid uterine mass 8.3 x 8.8 x 8.6 cm consistent with a large
leiomyoma, which causes partial shadowing. Additional smaller
leiomyomata at anterior wall and posterior walls of upper uterus,
several partially calcified. Small nabothian cyst at cervix.

Endometrium

Thickness: 12 mm. Visualized portion at upper uterine segment is
abnormally thickened for a postmenopausal patient. Mid uterine
portion is obscured secondary to large leiomyoma

Right ovary

Not visualized, likely obscured by bowel

Left ovary

Not visualized, likely obscured by bowel

Other findings

No free pelvic fluid.  No adnexal masses.
IMPRESSION: Multiple leiomyomata, largest of which is an 8.8 cm diameter mass at
the mid uterus.

Thickened endometrial complex 12 mm thick; Endometrial thickness is
considered abnormal for an asymptomatic post-menopausal female and
endometrial sampling should be considered to exclude carcinoma.

These results will be called to the ordering clinician or
representative by the Radiologist Assistant, and communication
documented in the PACS or [REDACTED].

## 2023-03-25 ENCOUNTER — Other Ambulatory Visit: Payer: Self-pay

## 2023-03-25 DIAGNOSIS — I10 Essential (primary) hypertension: Secondary | ICD-10-CM

## 2023-03-25 MED ORDER — SPIRONOLACTONE 25 MG PO TABS: ORAL_TABLET | ORAL | 3 refills | Status: DC

## 2023-04-03 ENCOUNTER — Ambulatory Visit: Payer: Medicare Other | Admitting: Cardiology

## 2023-04-03 ENCOUNTER — Encounter: Payer: Self-pay | Admitting: Cardiology

## 2023-04-03 VITALS — BP 130/76 | HR 69 | Ht 63.0 in | Wt 238.0 lb

## 2023-04-03 DIAGNOSIS — R0609 Other forms of dyspnea: Secondary | ICD-10-CM

## 2023-04-03 DIAGNOSIS — R002 Palpitations: Secondary | ICD-10-CM

## 2023-04-03 DIAGNOSIS — I10 Essential (primary) hypertension: Secondary | ICD-10-CM

## 2023-04-03 NOTE — Progress Notes (Signed)
Primary Physician/Referring:  Moshe Cipro, NP  Patient ID: Sherry Bell, female    DOB: 02-26-1946, 77 y.o.   MRN: [id number]  Chief Complaint  Patient presents with   Primary hypertension   Follow-up   HPI:    Sherry Bell  is a 77 y.o. Caucasian female patient with history of HTN, very mild HLD with elevated LDL particle number, obesity, and pre-diabetes, obstructive sleep apnea and has been using CPAP.    Except for mild chronic dyspnea on exertion that has remained stable, she has not had any further palpitations since being on Bystolic. No leg edema, no PND or orthopnea.  Denies chest pain.   Past Medical History:  Diagnosis Date   Allergy    Arthritis    Cancer (HCC)    BREAST RIGHT   Cataract    Cystocele    Fibroids    Gastric reflux    Generalized headaches    GERD (gastroesophageal reflux disease)    H. pylori infection    8 years ago   Heart murmur    Mild mitral regurgitation- pt denies this 07-02-16 at her [facility]   Hiatal hernia    Hypertension    Obstructive sleep apnea    uses CPAP   Pain in limb    Sleep apnea    uses CPAP   Thyroid disease    Tubular adenoma of colon    Varicose veins    Past Surgical History:  Procedure Laterality Date   BREAST SURGERY Right    SEPTEMBER 12 TH 2023   BREATH TEK H PYLORI N/A 05/14/2016   Procedure: BREATH Nelda Bucks;  Surgeon: Beverley Fiedler, MD;  Location: [facility];  Service: Gastroenterology;  Laterality: N/A;   COLONOSCOPY     Dr Virginia Rochester   ESOPHAGOGASTRODUODENOSCOPY     Dr Virginia Rochester   TONSILECTOMY, ADENOIDECTOMY, BILATERAL MYRINGOTOMY AND TUBES     UPPER GASTROINTESTINAL ENDOSCOPY     Family History  Problem Relation Age of Onset   Kidney disease Mother    Throat cancer Father        worked at [employer]   Hypothyroidism Sister    Atrial fibrillation Sister 91   Heart failure Brother    Hypertension Brother    Heart failure Brother    Hypertension Brother    Colon cancer Neg Hx    Esophageal  cancer Neg Hx    Rectal cancer Neg Hx    Stomach cancer Neg Hx    Colon polyps Neg Hx    Uterine cancer Neg Hx    Ovarian cancer Neg Hx    Crohn's disease Neg Hx    Ulcerative colitis Neg Hx    Social History   Tobacco Use   Smoking status: Never    Passive exposure: Never   Smokeless tobacco: Never  Substance Use Topics   Alcohol use: No   Marital Status: Single  ROS  Review of Systems  Cardiovascular:  Positive for dyspnea on exertion. Negative for chest pain, leg swelling and palpitations.  Respiratory:  Positive for snoring (on CPAP and compliant).   Gastrointestinal:  Negative for melena.   Objective      04/03/2023   12:47 PM 03/06/2023    9:07 AM 12/19/2022   12:04 PM  Vitals with BMI  Height 5\' 3"  5\' 3"    Weight 238 lbs 236 lbs 13 oz   BMI 42.17 41.96   Systolic 130 110 409  Diastolic 76 68 60  Pulse 69 61 66    Physical Exam Constitutional:      Appearance: She is morbidly obese.  Neck:     Thyroid: Thyromegaly present.     Vascular: No carotid bruit or JVD.  Cardiovascular:     Rate and Rhythm: Normal rate and regular rhythm.     Pulses: Normal pulses and intact distal pulses.     Heart sounds: Normal heart sounds. No murmur heard.    No gallop.  Pulmonary:     Effort: Pulmonary effort is normal.     Breath sounds: Normal breath sounds.  Abdominal:     General: Bowel sounds are normal.     Palpations: Abdomen is soft.  Musculoskeletal:     Right lower leg: No edema.     Left lower leg: No edema.  Skin:    General: Skin is warm and dry.    Laboratory examination:   Lab Results  Component Value Date   NA 137 05/01/2022   K 4.8 05/01/2022   CO2 28 05/01/2022   GLUCOSE 110 (H) 03/06/2023   BUN 18 05/01/2022   CREATININE 0.82 05/01/2022   CALCIUM 9.6 05/01/2022   GFRNONAA >60 07/27/2018   Lab Results  Component Value Date   WBC 7.3 08/15/2021   HGB 13.1 08/15/2021   HCT 39.7 08/15/2021   MCV 91.5 08/15/2021   PLT 210.0 08/15/2021     TSH Recent Labs    05/01/22 1000 10/29/22 0947 03/06/23 0948  TSH 1.64 0.46 0.71   Lab Results  Component Value Date   CHOL 170 10/05/2020   HDL 50 10/05/2020   LDLCALC 101 10/05/2020   TRIG 109 10/05/2020    Radiology:  No results found.  Cardiac Studies:   SLEEP STUDY 03/24/12 (POSITIVE)  on CPAP and compliant.  Treadmill exercise stress test 03/25/2017: Indication: SoB and HTN Resting EKG demonstrates NSR. The patient exercised according to Bruce Protocol, Total time recorded 5:29 min achieving max heart rate of 146 which was 96% of THR for age and 7.05 METS of work. Stress terminated due to dyspnea, fatigue and THR (>85% MPHR)/MPHR met. Normal BP response. There was no ST-T changes of ischemia with exercise stress test. There were no significant arrhythmias. Normal BP response. Rec: No e/o ischemia by GXT. Exercise tolerence is markedly reduced due to aerobic intolerence. Continue Preventive therapy.  Zio Patch Extended out patient EKG monitoring 14 days starting 06/26/2021: Predominant rhythm is normal sinus rhythm.  There were 26 brief atrial tachycardia episodes, longest 19 beats.  There was 1 NSVT episode of 5 beats per occasional PVCs, PACs and ventricular couplets.  No symptoms reported.   PCV ECHOCARDIOGRAM COMPLETE 08/02/2021  Narrative Echocardiogram 08/02/2021: Normal LV systolic function with visual EF 60-65%. Left ventricle cavity is normal in size. Mild left ventricular hypertrophy. Normal global wall motion. Normal diastolic filling pattern, normal LAP. Mild (Grade I) mitral regurgitation. Mild tricuspid regurgitation. Mild pulmonary hypertension. RVSP measures 36 mmHg. IVC is dilated with a respiratory response of <50%, estimated RAP . Compared to study 04/09/2017: G2DD and elevated LAP is now normal, PHTN is new finding, otherwise no significant change.    EKG   EKG 04/01/2023: Normal sinus rhythm at rate of 64 bpm, normal EKG.  No significant change  from 04/02/2022.  Medications and allergies   Allergies  Allergen Reactions   Amlodipine Other (See Comments)    Hair loss. Heart burn   Amoxicillin Nausea And Vomiting    Unknown   Pneumococcal 13-Val  Conj Vacc Other (See Comments)   Prevnar 13 [Pneumococcal 13-Val Conj Vacc] Other (See Comments)   Tetanus-Diphth-Acell Pertussis Other (See Comments)   Codeine Nausea And Vomiting, Rash and Nausea Only   Tetanus-Diphth-Acell Pertussis Palpitations    Current Outpatient Medications:    Ascorbic Acid (VITAMIN C PO), Take 400 mg by mouth daily., Disp: , Rfl:    B Complex Vitamins (VITAMIN B COMPLEX) CAPS, Take by mouth. TAKE ONE DAILY, Disp: , Rfl:    cholecalciferol (VITAMIN D) 1000 UNITS tablet, Take 7,000 Units by mouth daily. , Disp: , Rfl:    liothyronine (CYTOMEL) 5 MCG tablet, TAKE 2 TABLETS BY MOUTH DAILY, Disp: 180 tablet, Rfl: 2   Magnesium 300 MG CAPS, Take 1 capsule by mouth daily. Reported on 02/14/2016,TAKING 500 MG DAILY, Disp: , Rfl:    meloxicam (MOBIC) 15 MG tablet, Take 15 mg by mouth daily as needed. , Disp: , Rfl:    nebivolol (BYSTOLIC) 10 MG tablet, Take 1 tablet by mouth daily., Disp: , Rfl:    Omega-3 Fatty Acids (FISH OIL PO), Take by mouth. TAKE 2 DAILY, Disp: , Rfl:    spironolactone (ALDACTONE) 25 MG tablet, TAKE 1 TABLET BY MOUTH EVERY DAY IN THE MORNING, Disp: 90 tablet, Rfl: 3   SYNTHROID 125 MCG tablet, TAKE 1 TABLET BY MOUTH EVERY DAY BEFORE BREAKFAST, Disp: 90 tablet, Rfl: 1   Assessment     ICD-10-CM   1. Palpitations  R00.2 EKG 12-Lead    2. Primary hypertension  I10     3. Dyspnea on exertion  R06.09      No orders of the defined types were placed in this encounter.  Recommendations:   QUYNN SHIRD is a 77 y.o. Caucasian female patient with history of HTN, very mild HLD with elevated LDL particle number, obesity, and pre-diabetes, obstructive sleep apnea and has been using CPAP.    1. Palpitations Patient completely asymptomatic and  since being on Bystolic, blood pressure is also well-controlled, she has not had any palpitations, remains asymptomatic.  EKG is normal. - EKG 12-Lead  2. Primary hypertension Blood pressure in excellent control with addition of Bystolic and spironolactone, external labs reviewed, renal function is normal.  No clinical evidence of heart failure.  3. Dyspnea on exertion Dyspnea on exertion is related to age-related deconditioning along with obesity contributing.  I have discussed with her regarding increasing physical activity and also to consider water aerobics.  I do not suspect heart failure, her echocardiogram done a year and a half ago was completely normal except for very minimal mitral and tricuspid regurgitation.    She has mild thyromegaly/mass noted that appears slightly more prominent to me than previous, very close to the carotid artery, I do not think this is a pulsatile mass.  However we will send a note to Dr. Reather Littler to see whether he can proceed with getting an ultrasound of the neck to exclude any pulsatile mass.  I will see her back on a as needed basis.  I will request Moshe Cipro, NP to take over her prescriptions as well.    Yates Decamp, PA-C 04/03/2023, 1:32 PM Office: [phone]

## 2023-04-17 ENCOUNTER — Ambulatory Visit: Payer: Medicare Other | Admitting: Endocrinology

## 2023-04-29 ENCOUNTER — Other Ambulatory Visit (INDEPENDENT_AMBULATORY_CARE_PROVIDER_SITE_OTHER): Payer: Medicare Other

## 2023-04-29 DIAGNOSIS — E038 Other specified hypothyroidism: Secondary | ICD-10-CM

## 2023-04-29 DIAGNOSIS — I10 Essential (primary) hypertension: Secondary | ICD-10-CM

## 2023-04-29 LAB — BASIC METABOLIC PANEL
BUN: 16 mg/dL (ref 6–23)
CO2: 30 mEq/L (ref 19–32)
Calcium: 9.5 mg/dL (ref 8.4–10.5)
Chloride: 101 mEq/L (ref 96–112)
Creatinine, Ser: 0.9 mg/dL (ref 0.40–1.20)
GFR: 62.7 mL/min (ref >60.00)
Glucose, Bld: 102 mg/dL — ABNORMAL HIGH (ref 70–99)
Potassium: 4.9 mEq/L (ref 3.5–5.1)
Sodium: 138 mEq/L (ref 135–145)

## 2023-05-01 ENCOUNTER — Encounter: Payer: Self-pay | Admitting: Endocrinology

## 2023-05-01 ENCOUNTER — Ambulatory Visit (INDEPENDENT_AMBULATORY_CARE_PROVIDER_SITE_OTHER): Payer: Medicare Other | Admitting: Endocrinology

## 2023-05-01 VITALS — BP 128/74 | HR 86 | Ht 63.0 in | Wt 235.4 lb

## 2023-05-01 DIAGNOSIS — E041 Nontoxic single thyroid nodule: Secondary | ICD-10-CM

## 2023-05-01 DIAGNOSIS — E038 Other specified hypothyroidism: Secondary | ICD-10-CM

## 2023-05-01 DIAGNOSIS — R7303 Prediabetes: Secondary | ICD-10-CM | POA: Diagnosis not present

## 2023-05-01 LAB — T4, FREE: Free T4: 0.78 ng/dL (ref 0.60–1.60)

## 2023-05-01 LAB — TSH: TSH: 0.34 u[IU]/mL — ABNORMAL LOW (ref 0.35–5.50)

## 2023-05-01 LAB — T3, FREE: T3, Free: 2.4 pg/mL (ref 2.3–4.2)

## 2023-05-01 NOTE — Patient Instructions (Signed)
Try Snthroid 10 min before Breakfast

## 2023-05-01 NOTE — Progress Notes (Signed)
Patient ID: Sherry Bell, female   DOB: 05/16/1946, 77 y.o.   MRN: [id number]           Reason for Appointment: Follow-up of hypothyroidism     History of Present Illness:    HYPOTHYROIDISM:  This was diagnosed probably in the 1990s when the patient had gone to her physician because of weight gain. She does not think she had any unusual fatigue at that time She was found to be hypothyroid She does not think she felt any different with taking the thyroid supplements but her weight stabilized.  Her previous endocrinologist put her on combination of Synthroid and Cytomel 25 g, 1/2 tablet, this was prescribed twice a day  Subsequently she had been taking the Cytomel only in the morning since she would forget the evening dose Prior to her visit here she has been on 25 mcg of levothyroxine, using half tablet twice daily  Recent history:  On her initial consultation her liothyronine was switched to 10 mcg instead of 12.5 for better compliance Also levothyroxine had been progressively increased until 1/21  She was taking the brand-name Synthroid 125 mcg every morning She also thinks that she was taking liothyronine twice a day although generally not consistent with this every day and would miss some doses in the afternoon  She now says that she was nauseated with Synthroid but she has not mentioned this before, was taking this about 1 hour before breakfast with only a sip of water She has gone to a holistic physician in [place] and is not taking equivalent of [place] Thyroid 90 mg twice a day Does not feel any different with this and has no change in her energy level   Wt Readings from Last 3 Encounters:  05/01/23 235 lb 6.4 oz (106.8 kg)  04/03/23 238 lb (108 kg)  03/06/23 236 lb 12.8 oz (107.4 kg)    TSH is pending  Lab Results  Component Value Date   FREET4 0.96 03/06/2023   FREET4 1.03 10/29/2022   FREET4 0.92 11/14/2021   TSH 0.71 03/06/2023   TSH 0.46 10/29/2022    TSH 1.64 05/01/2022   Lab Results  Component Value Date   T3FREE 3.8 10/29/2022   T3FREE 3.3 11/14/2021   T3FREE 2.9 05/12/2021    THYROID cyst:  She had a visit in April for her thyroid swelling and was concerned about increasing swelling She thinks that it is smaller now Again she has no difficulty swallowing, no pressure or discomfort in the lower neck  History: She had a predominantly cystic lesion in the upper pole of her thyroid measuring 1.7 cm in 2003 This was biopsied in 2002 and was benign Also she had a subcentimeter cyst in the lower left pole of the left side in 2003  She had a recurrence of the cyst in 2017 with local pressure symptoms Aspiration of this cyst was successfully done in June 2017 by Dr. Everardo All and about 100 mL of fluid removed which was negative for cytology  The thyroid ultrasound in 2016 showed the following Dominant simple appearing cyst occupies much of the left lobe and measures approximately 5.2 x 5.4 x 4.6 cm.  This cyst does not have any complex features or mural nodularity.    Allergies as of 05/01/2023       Reactions   Amlodipine Other (See Comments)   Hair loss. Heart burn   Amoxicillin Nausea And Vomiting   Unknown   Pneumococcal 13-val Conj Vacc Other (See  Comments)   Prevnar 13 [pneumococcal 13-val Conj Vacc] Other (See Comments)   Tetanus-diphth-acell Pertussis Other (See Comments)   Codeine Nausea And Vomiting, Rash, Nausea Only   Tetanus-diphth-acell Pertussis Palpitations        Medication List        Accurate as of May 01, 2023 10:46 AM. If you have any questions, ask your nurse or doctor.          cholecalciferol 1000 units tablet Commonly known as: VITAMIN D Take 7,000 Units by mouth daily.   FISH OIL PO Take by mouth. TAKE 2 DAILY   liothyronine 5 MCG tablet Commonly known as: CYTOMEL TAKE 2 TABLETS BY MOUTH DAILY   Magnesium 300 MG Caps Take 1 capsule by mouth daily. Reported on 02/14/2016,TAKING  500 MG DAILY   meloxicam 15 MG tablet Commonly known as: MOBIC Take 15 mg by mouth daily as needed.   nebivolol 10 MG tablet Commonly known as: BYSTOLIC Take 1 tablet by mouth daily.   spironolactone 25 MG tablet Commonly known as: ALDACTONE TAKE 1 TABLET BY MOUTH EVERY DAY IN THE MORNING   Synthroid 125 MCG tablet Generic drug: levothyroxine TAKE 1 TABLET BY MOUTH EVERY DAY BEFORE BREAKFAST   Vitamin B Complex Caps Take by mouth. TAKE ONE DAILY   VITAMIN C PO Take 400 mg by mouth daily.        Allergies:  Allergies  Allergen Reactions   Amlodipine Other (See Comments)    Hair loss. Heart burn   Amoxicillin Nausea And Vomiting    Unknown   Pneumococcal 13-Val Conj Vacc Other (See Comments)   Prevnar 13 [Pneumococcal 13-Val Conj Vacc] Other (See Comments)   Tetanus-Diphth-Acell Pertussis Other (See Comments)   Codeine Nausea And Vomiting, Rash and Nausea Only   Tetanus-Diphth-Acell Pertussis Palpitations    Past Medical History:  Diagnosis Date   Allergy    Arthritis    Cancer (HCC)    BREAST RIGHT   Cataract    Cystocele    Fibroids    Gastric reflux    Generalized headaches    GERD (gastroesophageal reflux disease)    H. pylori infection    8 years ago   Heart murmur    Mild mitral regurgitation- pt denies this 07-02-16 at her [facility]   Hiatal hernia    Hypertension    Obstructive sleep apnea    uses CPAP   Pain in limb    Sleep apnea    uses CPAP   Thyroid disease    Tubular adenoma of colon    Varicose veins     There is no history of radiation to the neck in childhood  Past Surgical History:  Procedure Laterality Date   BREAST SURGERY Right    SEPTEMBER 12 TH 2023   BREATH TEK H PYLORI N/A 05/14/2016   Procedure: BREATH Nelda Bucks;  Surgeon: Beverley Fiedler, MD;  Location: [facility];  Service: Gastroenterology;  Laterality: N/A;   COLONOSCOPY     Dr Virginia Rochester   ESOPHAGOGASTRODUODENOSCOPY     Dr Theodoro Clock, ADENOIDECTOMY, BILATERAL  MYRINGOTOMY AND TUBES     UPPER GASTROINTESTINAL ENDOSCOPY      Family History  Problem Relation Age of Onset   Kidney disease Mother    Throat cancer Father        worked at [employer]   Hypothyroidism Sister    Atrial fibrillation Sister 60   Heart failure Brother    Hypertension Brother  Heart failure Brother    Hypertension Brother    Colon cancer Neg Hx    Esophageal cancer Neg Hx    Rectal cancer Neg Hx    Stomach cancer Neg Hx    Colon polyps Neg Hx    Uterine cancer Neg Hx    Ovarian cancer Neg Hx    Crohn's disease Neg Hx    Ulcerative colitis Neg Hx     Social History:  reports that she has never smoked. She has never been exposed to tobacco smoke. She has never used smokeless tobacco. She reports that she does not drink alcohol and does not use drugs.   Review of Systems:     She has had sleep apnea diagnosed in 2013 using CPAP  HYPERTENSION: Taking amlodipine and Bystolic from cardiologist Blood pressure readings as follows       BP Readings from Last 3 Encounters:  05/01/23 128/74  04/03/23 130/76  03/06/23 110/68   PREDIABETES: She was evaluated by her PCP and has had a A1c in the prediabetic range She had a postprandial reading of 121 at the highest level and now fasting reading is 102  She was told to try Rybelsus but she was afraid of side effects Weight is slightly better She has limited ability to walk because of knee joint pains  Lab Results  Component Value Date   HGBA1C 6.2 10/29/2022   HGBA1C 6.1 05/01/2022   HGBA1C 6.0 08/15/2021   Lab Results  Component Value Date   LDLCALC 101 10/05/2020   CREATININE 0.90 04/29/2023      Examination:   BP 128/74 (BP Location: Left Arm, Patient Position: Sitting, Cuff Size: Normal)   Pulse 86   Ht 5\' 3"  (1.6 m)   Wt 235 lb 6.4 oz (106.8 kg)   SpO2 97%   BMI 41.70 kg/m        Left lobe and isthmus are enlarged Neck circumference is 44 cm Thyroid enlargement consists of a soft swelling  mostly of the medial left lobe and about twice normal, less extension laterally Right side is not palpable Pemberton sign negative No stridor on auscultation  Assessment/Plan:   HYPOTHYROIDISM, longstanding:   She was on liothyronine 10 mcg once or twice a day and Synthroid 125 mcg but now on NP thyroid 90 mg twice daily from a holistic physician Although subjectively she feels about the same with the new regimen the total amount of levothyroxine equivalent appears to be higher than before with the NP thyroid  Labs pending  However discussed that she is taking excessive amount of thyroid supplement now and would rather have her take individual doses of Synthroid and liothyronine For better tolerability she will need to take her Synthroid with a full glass of water and can take a 10-minute before her food instead of 60 minutes; currently Tirosint is not covered by insurance  Recurrence of left-sided thyroid cyst, again asymptomatic No recent change in objectively feels relatively soft Ultrasound not indicated at this point  PREDIABETES Fasting glucose 102, discussed need for weight loss  Reather Littler 05/01/2023  Addendum: TSH is 0.34

## 2023-05-04 ENCOUNTER — Other Ambulatory Visit: Payer: Self-pay | Admitting: Cardiology

## 2023-05-04 DIAGNOSIS — I10 Essential (primary) hypertension: Secondary | ICD-10-CM

## 2023-05-04 DIAGNOSIS — R002 Palpitations: Secondary | ICD-10-CM

## 2023-05-08 ENCOUNTER — Telehealth: Payer: Self-pay

## 2023-05-08 NOTE — Telephone Encounter (Signed)
Left message for patient to call back regarding results and recommendations. °

## 2023-05-08 NOTE — Telephone Encounter (Signed)
-----   Message from Reather Littler, MD sent at 05/01/2023  3:06 PM EDT ----- Please call to let patient know that the lab results indicate that thyroid level has gone up with low TSH and needs to take Synthroid and liothyronine as discussed

## 2023-06-03 ENCOUNTER — Telehealth: Payer: Self-pay | Admitting: Endocrinology

## 2023-06-03 ENCOUNTER — Other Ambulatory Visit: Payer: Self-pay | Admitting: Cardiology

## 2023-06-03 NOTE — Telephone Encounter (Signed)
Patient is calling for the lab results from her last visit.

## 2023-06-03 NOTE — Telephone Encounter (Signed)
I called and gave her Dr Emelda Brothers recommendation  Sherry Littler, MD 05/01/2023  3:06 PM EDT     Please call to let patient know that the lab results indicate that thyroid level has gone up with low TSH and needs to take Synthroid and liothyronine as discussed  And she understood

## 2023-06-24 DIAGNOSIS — R92323 Mammographic fibroglandular density, bilateral breasts: Secondary | ICD-10-CM | POA: Diagnosis not present

## 2023-06-24 DIAGNOSIS — Z853 Personal history of malignant neoplasm of breast: Secondary | ICD-10-CM | POA: Diagnosis not present

## 2023-06-24 DIAGNOSIS — R922 Inconclusive mammogram: Secondary | ICD-10-CM | POA: Diagnosis not present

## 2023-07-22 DIAGNOSIS — M170 Bilateral primary osteoarthritis of knee: Secondary | ICD-10-CM | POA: Diagnosis not present

## 2023-08-06 ENCOUNTER — Encounter: Payer: Self-pay | Admitting: Obstetrics and Gynecology

## 2023-08-06 DIAGNOSIS — I517 Cardiomegaly: Secondary | ICD-10-CM | POA: Diagnosis not present

## 2023-08-06 DIAGNOSIS — M7989 Other specified soft tissue disorders: Secondary | ICD-10-CM | POA: Diagnosis not present

## 2023-08-06 DIAGNOSIS — Z6841 Body Mass Index (BMI) 40.0 and over, adult: Secondary | ICD-10-CM | POA: Diagnosis not present

## 2023-08-21 ENCOUNTER — Encounter: Payer: Self-pay | Admitting: Pulmonary Disease

## 2023-08-21 ENCOUNTER — Ambulatory Visit: Payer: Medicare Other | Admitting: Pulmonary Disease

## 2023-08-21 VITALS — BP 136/62 | HR 68 | Temp 97.0°F | Ht 63.0 in | Wt 232.4 lb

## 2023-08-21 DIAGNOSIS — G4733 Obstructive sleep apnea (adult) (pediatric): Secondary | ICD-10-CM

## 2023-08-21 NOTE — Progress Notes (Signed)
Sherry Bell    [id number]    [birth year]  Primary Care Physician:Matthews, Judeth Cornfield, FNP  Referring Physician: Moshe Cipro, FNP [address] [place],  [place] [address]  Chief complaint:   Patient being seen for obstructive sleep apnea  HPI:  Diagnosed with obstructive sleep apnea about 8 years ago, was following up with Dr. Earl Gala who recently retired  She feels overall better with CPAP use Has been using CPAP on a regular basis  She is not sleeping sleeping well  We did discuss use of a sleep aid to help her sleep better -She will think about this  Known to have severe obstructive sleep apnea  Usually goes to bed between 11 and 12, takes about 30 minutes to fall asleep 2-3 awakenings Final wake up time between 7 and 8  Sleep is nonrestorative  Usually wakes up to use the bathroom, does this a couple of times at night  Does have dryness of the mouth in the morning, no morning headaches  DME company is [employer]  History of hypertension, breast cancer,   Outpatient Encounter Medications as of 08/21/2023  Medication Sig   Ascorbic Acid (VITAMIN C PO) Take 400 mg by mouth daily.   B Complex Vitamins (VITAMIN B COMPLEX) CAPS Take by mouth. TAKE ONE DAILY   cholecalciferol (VITAMIN D) 1000 UNITS tablet Take 7,000 Units by mouth daily.    liothyronine (CYTOMEL) 5 MCG tablet TAKE 2 TABLETS BY MOUTH DAILY   Magnesium 300 MG CAPS Take 1 capsule by mouth daily. Reported on 02/14/2016,TAKING 500 MG DAILY   meloxicam (MOBIC) 15 MG tablet Take 15 mg by mouth daily as needed.    nebivolol (BYSTOLIC) 10 MG tablet TAKE 1 TABLET BY MOUTH EVERY DAY   Omega-3 Fatty Acids (FISH OIL PO) Take by mouth. TAKE 2 DAILY   spironolactone (ALDACTONE) 25 MG tablet TAKE 1 TABLET BY MOUTH EVERY DAY IN THE MORNING   SYNTHROID 125 MCG tablet TAKE 1 TABLET BY MOUTH EVERY DAY BEFORE BREAKFAST   No facility-administered encounter medications on file as of 08/21/2023.     Allergies as of 08/21/2023 - Review Complete 08/21/2023  Allergen Reaction Noted   Amlodipine Other (See Comments) 04/05/2021   Amoxicillin Nausea And Vomiting 06/05/2016   Pneumococcal 13-val conj vacc Other (See Comments) 08/15/2015   Prevnar 13 [pneumococcal 13-val conj vacc] Other (See Comments) 03/31/2021   Tetanus-diphth-acell pertussis Other (See Comments) 03/31/2021   Codeine Nausea And Vomiting, Rash, and Nausea Only 07/09/2011   Tetanus-diphth-acell pertussis Palpitations 08/15/2015    Past Medical History:  Diagnosis Date   Allergy    Arthritis    Cancer (HCC)    BREAST RIGHT   Cataract    Cystocele    Fibroids    Gastric reflux    Generalized headaches    GERD (gastroesophageal reflux disease)    H. pylori infection    8 years ago   Heart murmur    Mild mitral regurgitation- pt denies this 07-02-16 at her [facility]   Hiatal hernia    Hypertension    Obstructive sleep apnea    uses CPAP   Pain in limb    Sleep apnea    uses CPAP   Thyroid disease    Tubular adenoma of colon    Varicose veins     Past Surgical History:  Procedure Laterality Date   BREAST SURGERY Right    SEPTEMBER 12 [facility] 2023  BREATH TEK H PYLORI N/A 05/14/2016   Procedure: BREATH TEK H PYLORI;  Surgeon: Beverley Fiedler, MD;  Location: [facility];  Service: Gastroenterology;  Laterality: N/A;   COLONOSCOPY     Dr Virginia Rochester   ESOPHAGOGASTRODUODENOSCOPY     Dr Virginia Rochester   TONSILECTOMY, ADENOIDECTOMY, BILATERAL MYRINGOTOMY AND TUBES     UPPER GASTROINTESTINAL ENDOSCOPY      Family History  Problem Relation Age of Onset   Kidney disease Mother    Throat cancer Father        worked at [employer]   Hypothyroidism Sister    Atrial fibrillation Sister 41   Heart failure Brother    Hypertension Brother    Heart failure Brother    Hypertension Brother    Colon cancer Neg Hx    Esophageal cancer Neg Hx    Rectal cancer Neg Hx    Stomach cancer Neg Hx    Colon polyps Neg Hx    Uterine cancer Neg Hx     Ovarian cancer Neg Hx    Crohn's disease Neg Hx    Ulcerative colitis Neg Hx     Social History   Socioeconomic History   Marital status: Single    Spouse name: Not on file   Number of children: 1   Years of education: Not on file   Highest education level: Not on file  Occupational History   Occupation: hairdresser  Tobacco Use   Smoking status: Never    Passive exposure: Never   Smokeless tobacco: Never  Vaping Use   Vaping status: Never Used  Substance and Sexual Activity   Alcohol use: No   Drug use: Never   Sexual activity: Never  Other Topics Concern   Not on file  Social History Narrative   Not on file   Social Determinants of Health   Financial Resource Strain: Low Risk  (02/13/2023)   Received from [facility], [facility]   Overall Financial Resource Strain (CARDIA)    Difficulty of Paying Living Expenses: Not hard at all  Food Insecurity: No Food Insecurity (02/13/2023)   Received from [facility], [facility]   Hunger Vital Sign    Worried About Running Out of Food in the Last Year: Never true    Ran Out of Food in the Last Year: Never true  Transportation Needs: No Transportation Needs (02/13/2023)   Received from [facility], [facility]   PRAPARE - Transportation    Lack of Transportation (Medical): No    Lack of Transportation (Non-Medical): No  Physical Activity: Not on file  Stress: Not on file  Social Connections: Unknown (06/21/2022)   Received from [facility], [facility]   Social Network    Social Network: Not on file  Intimate Partner Violence: Unknown (06/21/2022)   Received from [facility], [facility]   HITS    Physically Hurt: Not on file    Insult or Talk Down To: Not on file    Threaten Physical Harm: Not on file    Scream or Curse: Not on file    Review of Systems  Constitutional:  Negative for fatigue.  Respiratory:  Positive for apnea. Negative for shortness of breath.   Psychiatric/Behavioral:   Positive for sleep disturbance.     Vitals:   08/21/23 1029  BP: 136/62  Pulse: 68  Temp: (!) 97 F (36.1 C)  SpO2: 96%     Physical Exam Constitutional:      Appearance: She is obese.  HENT:     Head: Normocephalic.     Mouth/Throat:     Mouth: Mucous membranes are moist.  Eyes:     Conjunctiva/sclera: Conjunctivae normal.  Cardiovascular:     Rate and Rhythm: Normal rate and regular rhythm.     Heart sounds: No murmur heard.    No friction rub.  Pulmonary:     Effort: No respiratory distress.     Breath sounds: No stridor. No wheezing or rhonchi.  Musculoskeletal:     Cervical back: No rigidity or tenderness.  Neurological:     Mental Status: She is alert.  Psychiatric:        Mood and Affect: Mood normal.    Data Reviewed: Previous sleep study not available to be reviewed  Compliance report discussed with the patient showing 100% compliance with CPAP Average use of 5 hours 33 minutes CPAP of 11 Residual AHI of 1.2  Assessment:  History of severe obstructive sleep apnea Compliant with CPAP therapy  Nonrestorative sleep  Insomnia  Plan/Recommendations: Continue CPAP nightly  Encouraged to give [place] a call if she wants to try a sleep aid  Graded activities as tolerated for weight loss  Follow-up in 6 months    Virl Diamond MD [facility] 08/21/2023, 11:02 AM  CC: Moshe Cipro, FNP

## 2023-08-21 NOTE — Patient Instructions (Signed)
I will see you about 6 months from here  Continue using your CPAP nightly  We can try a sleep aid to help you get better quality sleep  -Example is Lunesta at a low dose -Usually goes from 1 to 3 mg  Ambien is another agent that can be tried  You only need to use them if you feel you have not slept for couple of days and just use them as needed  Call us to let us know if you want to try one

## 2023-09-18 DIAGNOSIS — M170 Bilateral primary osteoarthritis of knee: Secondary | ICD-10-CM | POA: Diagnosis not present

## 2023-11-18 DIAGNOSIS — L658 Other specified nonscarring hair loss: Secondary | ICD-10-CM | POA: Diagnosis not present

## 2023-11-18 DIAGNOSIS — I517 Cardiomegaly: Secondary | ICD-10-CM | POA: Diagnosis not present

## 2023-11-18 DIAGNOSIS — M7989 Other specified soft tissue disorders: Secondary | ICD-10-CM | POA: Diagnosis not present

## 2023-11-18 DIAGNOSIS — I10 Essential (primary) hypertension: Secondary | ICD-10-CM | POA: Diagnosis not present

## 2024-02-21 ENCOUNTER — Other Ambulatory Visit: Payer: Self-pay | Admitting: Cardiology

## 2024-02-21 DIAGNOSIS — I10 Essential (primary) hypertension: Secondary | ICD-10-CM

## 2024-02-24 ENCOUNTER — Other Ambulatory Visit: Payer: Self-pay | Admitting: Cardiology

## 2024-04-25 ENCOUNTER — Other Ambulatory Visit: Payer: Self-pay | Admitting: Cardiology

## 2024-05-19 DIAGNOSIS — G4733 Obstructive sleep apnea (adult) (pediatric): Secondary | ICD-10-CM | POA: Diagnosis not present

## 2024-06-08 DIAGNOSIS — R92323 Mammographic fibroglandular density, bilateral breasts: Secondary | ICD-10-CM | POA: Diagnosis not present

## 2024-06-08 DIAGNOSIS — R928 Other abnormal and inconclusive findings on diagnostic imaging of breast: Secondary | ICD-10-CM | POA: Diagnosis not present

## 2024-06-16 ENCOUNTER — Other Ambulatory Visit: Payer: Self-pay | Admitting: Cardiology

## 2024-06-22 DIAGNOSIS — I10 Essential (primary) hypertension: Secondary | ICD-10-CM | POA: Diagnosis not present

## 2024-06-22 DIAGNOSIS — E7800 Pure hypercholesterolemia, unspecified: Secondary | ICD-10-CM | POA: Diagnosis not present

## 2024-06-22 DIAGNOSIS — E559 Vitamin D deficiency, unspecified: Secondary | ICD-10-CM | POA: Diagnosis not present

## 2024-06-22 DIAGNOSIS — R600 Localized edema: Secondary | ICD-10-CM | POA: Diagnosis not present

## 2024-06-22 DIAGNOSIS — R7303 Prediabetes: Secondary | ICD-10-CM | POA: Diagnosis not present

## 2024-06-22 DIAGNOSIS — E039 Hypothyroidism, unspecified: Secondary | ICD-10-CM | POA: Diagnosis not present

## 2024-07-15 DIAGNOSIS — B372 Candidiasis of skin and nail: Secondary | ICD-10-CM | POA: Diagnosis not present

## 2024-07-15 DIAGNOSIS — B3731 Acute candidiasis of vulva and vagina: Secondary | ICD-10-CM | POA: Diagnosis not present

## 2024-07-15 DIAGNOSIS — N8111 Cystocele, midline: Secondary | ICD-10-CM | POA: Diagnosis not present

## 2024-07-16 ENCOUNTER — Ambulatory Visit: Payer: Self-pay | Attending: Cardiology | Admitting: Cardiology

## 2024-07-16 ENCOUNTER — Encounter: Payer: Self-pay | Admitting: Cardiology

## 2024-07-16 ENCOUNTER — Ambulatory Visit: Admitting: Cardiology

## 2024-07-16 VITALS — BP 138/70 | HR 59 | Resp 16 | Ht 63.0 in | Wt 222.0 lb

## 2024-07-16 DIAGNOSIS — G4733 Obstructive sleep apnea (adult) (pediatric): Secondary | ICD-10-CM

## 2024-07-16 DIAGNOSIS — I10 Essential (primary) hypertension: Secondary | ICD-10-CM | POA: Diagnosis not present

## 2024-07-16 NOTE — Patient Instructions (Signed)

## 2024-07-16 NOTE — Progress Notes (Unsigned)
 Cardiology Office Note:  .   Date:  07/17/2024  ID:  TELENA PEYSER, DOB [birth year], MRN [id number] PCP: Delayne Artist PARAS, MD  [facility] Providers Cardiologist:  Gordy Bergamo, MD   History of Present Illness: Sherry Bell   Sherry Bell is a 78 y.o. Caucasian female patient with history of HTN, very mild HLD with elevated LDL particle number, obesity, and pre-diabetes, obstructive sleep apnea and has been using CPAP.  She is asymptomatic and activity limited by bilateral degenerative knee disease.  Cardiac Studies relevent.    Echocardiogram 08/02/2021: Normal LVEF, normal diastolic function, mild MR and TR, PASP 36 mmHg.  GXT 03/25/2017: Mild decrease in exercise tolerance, exercised for 5 minutes and 23 seconds, achieved 7.05 METS.  No evidence of ischemia.  Zio patch monitoring 14 days 07/15/2021: Occasional PACs and PVCs and 1 NSVT 5 beats.  No symptoms reported.    Discussed the use of AI scribe software for clinical note transcription with the patient, who gave verbal consent to proceed.  History of Present Illness Sherry Bell is a 78 year old female with hypertension and sleep apnea who presents with leg pain.  She experiences persistent leg pain, which is painful to touch. A previous venous and blood flow test was normal, yet discomfort persists. Her hypertension is managed with nebivolol  taken in the evening, which helps control her blood pressure. She previously discontinued spironolactone  due to elevated potassium levels. Her blood pressure readings have been stable, with the highest recorded at 138/70 mmHg. She uses a CPAP machine for sleep apnea, although she finds it uncomfortable. No cardiac symptoms are present.   Labs   Care everywhere/Faxed External Labs:  Labs 06/23/2024:  Total cholesterol 174, triglycerides 123, HDL 54, LDL 98.  BNP: 111.4  BUN 24, creatinine 0.92, potassium 4.8, LFTs normal.  Hb 13.0/HCT 39.3, platelets 228.  A1c 5.6%.  TSH normal at  0.25.  ROS  Review of Systems  Cardiovascular:  Negative for chest pain, dyspnea on exertion and leg swelling.  Respiratory:  Positive for snoring.    Physical Exam:   VS:  BP 138/70 (BP Location: Left Arm, Patient Position: Sitting, Cuff Size: Large)   Pulse (!) 59   Resp 16   Ht 5' 3 (1.6 m)   Wt 222 lb (100.7 kg)   SpO2 93%   BMI 39.33 kg/m    Wt Readings from Last 3 Encounters:  07/16/24 222 lb (100.7 kg)  08/21/23 232 lb 6.4 oz (105.4 kg)  05/01/23 235 lb 6.4 oz (106.8 kg)    BP Readings from Last 3 Encounters:  07/16/24 138/70  08/21/23 136/62  05/01/23 128/74   Physical Exam Constitutional:      Appearance: She is obese.  Neck:     Vascular: No carotid bruit or JVD.  Cardiovascular:     Rate and Rhythm: Normal rate and regular rhythm.     Pulses: Intact distal pulses.     Heart sounds: Normal heart sounds. No murmur heard.    No gallop.  Pulmonary:     Effort: Pulmonary effort is normal.     Breath sounds: Normal breath sounds.  Abdominal:     General: Bowel sounds are normal.     Palpations: Abdomen is soft.  Musculoskeletal:     Right lower leg: No edema.     Left lower leg: No edema.    EKG:    EKG Interpretation Date/Time:  Thursday July 16 2024 16:00:12 EDT Ventricular Rate:  61 PR Interval:  196 QRS Duration:  80 QT Interval:  430 QTC Calculation: 432 R Axis:   24  Text Interpretation: EKG 07/16/2024: Normal sinus rhythm with rate of 61 bpm, normal EKG.  Compared to 07/27/2018, no change. Confirmed by Evalee Gerard, Jagadeesh ([id number]) on 07/16/2024 4:26:28 PM    ASSESSMENT AND PLAN: .      ICD-10-CM   1. Essential (primary) hypertension  I10 EKG 12-Lead    2. OSA on CPAP  G47.33      Assessment & Plan Obesity Obesity significantly impacts overall health, affecting mobility and contributing to knee and back pain. - Encourage weight loss to improve mobility and reduce pain.  Chronic knee pain Chronic knee pain contributes to mobility issues.  She is hesitant about knee surgery due to fear, but it is recommended to improve mobility and reduce pain. - Consider knee surgery to improve mobility and reduce pain.  Hypertension, well controlled Hypertension is well controlled with nebivolol  10 mg once daily. She previously discontinued spironolactone  due to concerns about potassium levels, but blood pressure remains well managed. - Continue nebivolol  10 mg once daily. - Refill nebivolol  as needed through primary care physician, Dr. Delayne.  Obstructive sleep apnea, on CPAP Obstructive sleep apnea is managed with CPAP therapy, which she dislikes but continues to use.  Hypothyroidism, on liothyronine  Hypothyroidism is managed with liothyronine , two tablets in the morning. Thyroid  function is normal.   Follow up: PRN  Signed,  Gordy Bergamo, MD, [facility] 07/17/2024, 5:28 AM [facility] [address] [place], [place] [address] Phone: [phone]. Fax:  [phone]

## 2024-07-20 DIAGNOSIS — G8929 Other chronic pain: Secondary | ICD-10-CM | POA: Diagnosis not present

## 2024-07-20 DIAGNOSIS — E039 Hypothyroidism, unspecified: Secondary | ICD-10-CM | POA: Diagnosis not present

## 2024-07-20 DIAGNOSIS — I10 Essential (primary) hypertension: Secondary | ICD-10-CM | POA: Diagnosis not present

## 2024-07-20 DIAGNOSIS — E559 Vitamin D deficiency, unspecified: Secondary | ICD-10-CM | POA: Diagnosis not present

## 2024-08-17 DIAGNOSIS — G4733 Obstructive sleep apnea (adult) (pediatric): Secondary | ICD-10-CM | POA: Diagnosis not present

## 2024-08-26 DIAGNOSIS — M170 Bilateral primary osteoarthritis of knee: Secondary | ICD-10-CM | POA: Diagnosis not present

## 2024-09-18 ENCOUNTER — Other Ambulatory Visit: Payer: Self-pay | Admitting: Cardiology

## 2024-09-21 DIAGNOSIS — E7800 Pure hypercholesterolemia, unspecified: Secondary | ICD-10-CM | POA: Diagnosis not present

## 2024-09-21 DIAGNOSIS — Z0000 Encounter for general adult medical examination without abnormal findings: Secondary | ICD-10-CM | POA: Diagnosis not present

## 2024-09-21 DIAGNOSIS — R600 Localized edema: Secondary | ICD-10-CM | POA: Diagnosis not present

## 2024-09-21 DIAGNOSIS — B850 Pediculosis due to Pediculus humanus capitis: Secondary | ICD-10-CM | POA: Diagnosis not present

## 2024-09-21 DIAGNOSIS — R7303 Prediabetes: Secondary | ICD-10-CM | POA: Diagnosis not present

## 2024-09-21 DIAGNOSIS — E559 Vitamin D deficiency, unspecified: Secondary | ICD-10-CM | POA: Diagnosis not present

## 2024-10-02 ENCOUNTER — Other Ambulatory Visit: Payer: Self-pay | Admitting: Cardiology

## 2024-10-03 ENCOUNTER — Other Ambulatory Visit: Payer: Self-pay | Admitting: Physician Assistant

## 2024-10-03 MED ORDER — NEBIVOLOL HCL 10 MG PO TABS: 10.0000 mg | ORAL_TABLET | Freq: Every day | ORAL | 3 refills | Status: AC

## 2024-11-02 ENCOUNTER — Ambulatory Visit: Admitting: Obstetrics and Gynecology

## 2024-11-10 ENCOUNTER — Encounter: Payer: Self-pay | Admitting: Obstetrics

## 2024-11-10 ENCOUNTER — Ambulatory Visit: Admitting: Obstetrics

## 2024-11-10 ENCOUNTER — Other Ambulatory Visit (HOSPITAL_COMMUNITY)
Admission: RE | Admit: 2024-11-10 | Discharge: 2024-11-10 | Disposition: A | Source: Ambulatory Visit | Attending: Obstetrics | Admitting: Obstetrics

## 2024-11-10 VITALS — BP 152/85 | HR 58 | Ht 61.42 in | Wt 229.0 lb

## 2024-11-10 DIAGNOSIS — N898 Other specified noninflammatory disorders of vagina: Secondary | ICD-10-CM | POA: Diagnosis present

## 2024-11-10 DIAGNOSIS — N8110 Cystocele, unspecified: Secondary | ICD-10-CM | POA: Insufficient documentation

## 2024-11-10 DIAGNOSIS — N952 Postmenopausal atrophic vaginitis: Secondary | ICD-10-CM | POA: Insufficient documentation

## 2024-11-10 DIAGNOSIS — N3941 Urge incontinence: Secondary | ICD-10-CM | POA: Insufficient documentation

## 2024-11-10 DIAGNOSIS — D219 Benign neoplasm of connective and other soft tissue, unspecified: Secondary | ICD-10-CM

## 2024-11-10 DIAGNOSIS — Z6841 Body Mass Index (BMI) 40.0 and over, adult: Secondary | ICD-10-CM | POA: Insufficient documentation

## 2024-11-10 DIAGNOSIS — Z9889 Other specified postprocedural states: Secondary | ICD-10-CM | POA: Insufficient documentation

## 2024-11-10 LAB — POCT URINALYSIS DIP (CLINITEK)
Bilirubin, UA: NEGATIVE
Blood, UA: NEGATIVE
Glucose, UA: NEGATIVE mg/dL
Ketones, POC UA: NEGATIVE mg/dL
Leukocytes, UA: NEGATIVE
Nitrite, UA: NEGATIVE
POC PROTEIN,UA: NEGATIVE
Spec Grav, UA: 1.02 (ref 1.010–1.025)
Urobilinogen, UA: 0.2 U/dL
pH, UA: 6 (ref 5.0–8.0)

## 2024-11-10 MED ORDER — ESTRADIOL 0.01 % VA CREA: TOPICAL_CREAM | VAGINAL | 3 refills | Status: AC

## 2024-11-10 NOTE — Assessment & Plan Note (Addendum)
-   pending Nuswab to r/o candida glabrata - multiple treatments of diflucan in the past

## 2024-11-10 NOTE — Assessment & Plan Note (Addendum)
-   POCT UA negative, PVR 40mL We discussed the symptoms of overactive bladder (OAB), which include urinary urgency, urinary frequency, nocturia, with or without urge incontinence.  While we do not know the exact etiology of OAB, several treatment options exist. We discussed management including behavioral therapy (decreasing bladder irritants, urge suppression strategies, timed voids, bladder retraining), physical therapy, medication; for refractory cases posterior tibial nerve stimulation, sacral neuromodulation, and intravesical botulinum toxin injection.  For anticholinergic medications, we discussed the potential side effects of anticholinergics including dry eyes, dry mouth, constipation, cognitive impairment and urinary retention. For Beta-3 agonist medication, we discussed the potential side effect of elevated blood pressure which is more likely to occur in individuals with uncontrolled hypertension. - encouraged to reduce caffeine use due to association with urinary frequency - reassess after pessary placement - will need urodynamics prior to surgery - encouraged weight reduction and monitor blood sugar control, last HbA1C 6.2 in 2023

## 2024-11-10 NOTE — Assessment & Plan Note (Signed)
-   encouraged weight reduction due to association with pelvic floor disorders, risk of recurrence, and perioperative risks - pt reports prior diet with 40lb weight loss, desires to resume 11/2024 - discussed association of weight reduction with reduced urinary leakage symptoms

## 2024-11-10 NOTE — Progress Notes (Signed)
 New Patient Evaluation and Consultation  Referring Provider: Linnell Devere BRAVO, MD PCP: Delayne Artist PARAS, MD Date of Service: 11/10/2024  SUBJECTIVE Chief Complaint: New Patient (Initial Visit) Sherry Bell is a 78 y.o. female here today for a cystocele. )  History of Present Illness: Sherry Bell is a 78 y.o. White or Caucasian female seen in consultation at the request of Dr Linnell for evaluation of stage IV cystocele.    Golfball size vaginal bulge to vaginal opening for 5 years, denies change in size, vaginal discharge or bleeding. Tried pessary use prior to 2021, disliked odor and need for self management twice a week. Reports difficulty reaching the vaginal for self management of pessary.  Reports moisture on underwear Reports intermittent constipation attributed to diet (rice and pasta) 1x/week requiring straining with larger bladder bulge, managed by oral magnesium for sleep. Evaluated by Dr. Alvia, last seen in 08/31/15, encouraged to continue #5 ring with support pessary use and consider TVH, BSO, A&P repair with SSLF. Desired to avoid mesh use due to concerns of cancer. Tried size 4 ring with support pessary with dental floss and recommended weight reduction. Evaluated by Dr. Viktoria for postmenopausal slowly enlarging fibroid uterus, last seen 10/24/2020. Recommended against UAE due to concerns of pain and uterine preservation. Reviewed option of joint surgical intervention for prolapse vs. Pelvic ultrasound every 4-6 months, pelvic MRI, and endometrial biopsy due to thickened endometrial lining 12mm on TVUS in 2022. Evaluated by Dr. MacDiarmid 12/27/20 for asymptomatic pelvic organ prolapse, offered vaginal vault suspension, with cystocele repair and graft. Recommended urodynamics and treated with Diflucan Lost 40lbs in the past on diet, planned to resume weight loss plan 11/2024 Uses intermittent triamcinolone cream for vulvar itching, unclear if prior vulvar biopsy  Review  of records significant for: Invasive ductal carcinoma with lobular features s/p right breast lumpectomy and elucent localized sentinel lymph node biopsy by Dr. Adrianne in 08/07/22, Pre-DM, DOE, lumbar radiculopathy  TVUS 04/18/21 CLINICAL DATA:  Fibroid uterus, postmenopausal   EXAM: TRANSABDOMINAL AND TRANSVAGINAL ULTRASOUND OF PELVIS   TECHNIQUE: Both transabdominal and transvaginal ultrasound examinations of the pelvis were performed. Transabdominal technique was performed for global imaging of the pelvis including uterus, ovaries, adnexal regions, and pelvic cul-de-sac. It was necessary to proceed with endovaginal exam following the transabdominal exam to visualize the uterus, endometrium, and ovaries.   COMPARISON:  None   FINDINGS: Uterus   Measurements: 14.5 x 4.9 x 11.0 cm = volume: 414 mL. Anteverted. Large mid uterine mass 8.3 x 8.8 x 8.6 cm consistent with a large leiomyoma, which causes partial shadowing. Additional smaller leiomyomata at anterior wall and posterior walls of upper uterus, several partially calcified. Small nabothian cyst at cervix.   Endometrium   Thickness: 12 mm. Visualized portion at upper uterine segment is abnormally thickened for a postmenopausal patient. Mid uterine portion is obscured secondary to large leiomyoma   Right ovary   Not visualized, likely obscured by bowel   Left ovary   Not visualized, likely obscured by bowel   Other findings   No free pelvic fluid.  No adnexal masses.   IMPRESSION: Multiple leiomyomata, largest of which is an 8.8 cm diameter mass at the mid uterus.   Thickened endometrial complex 12 mm thick; Endometrial thickness is considered abnormal for an asymptomatic post-menopausal female and endometrial sampling should be considered to exclude carcinoma.   These results will be called to the ordering clinician or representative by the Radiologist Assistant, and communication documented in the  PACS or  [employer].     Electronically Signed   By: Oneil Kiss M.D.   On: 04/18/2021 13:16  Per chart review of prior imaging studies: Pelvic ultrasound in 09/2020 at [facility]: Uterus measures 14.4 x 11.6 x 9.5 cm. Endometrial lining not able to be adequately seen. Multiple fibroids noted including a 3 x 2 x 3.6 right lateral fundal fibroid, a 2.8 x 2.3 x 2.8 cm right lateral fundal fibroid, an 8 x 8 x 7.5 cm midline fibroid, and a 4.3 x 3.3 x 3.5 cm posterior right fibroid.   Pelvic ultrasound in 04/2014 at [facility]: Uterus measures 11.8 x 10.6 x 7.5 cm, endometrium is poorly visualized. In the lower uterine segment portion, there is a small amount of fluid. 3 largest fibroids measured are a posterior left fibroid measuring 6.3 x 4.7 x 5.4 cm, and anterior fibroid measuring 4 x 3 x 3.8 cm, and a posterior fibroid measuring 4 x 3 x 2.9 cm.   CT urogram 02/2014: Uterus measures 11.1 x 8.9 cm with scattered punctate calcifications noted within. There are no definitive uterine fibroids. No definitive fluid seen within the canal. No discrete adnexal lesions. No free fluid in the pelvis.   Pelvic ultrasound at [facility] in 04/2004: Uterus measures 10.2 x 8.7 x 7.1 cm. Multiple fibroids noted, the largest measuring 4.2 x 4.4 x 4.5 cm.  Urinary Symptoms: Leaks urine with with a full bladder started 1 year ago Leaks 3 time(s) per days with small volume leakage Denies Pad use: 2 underwear changes per day.   Patient is bothered by UI symptoms.  Day time voids 6-8.  Nocturia: 3 times per night to void with CPAP use for OSA Takes BP medication at 9pm and magnesium Denies leg swelling Voiding dysfunction:  does not empty bladder well.  Patient does not use a catheter to empty bladder.  When urinating, patient feels difficulty starting urine stream, the need to urinate multiple times in a row, and to push on her belly or vagina to empty bladder Drinks: 40oz water per day, 8-16oz  coffee (increase urinary frequency with 2 cups of coffee)  UTIs: 0 UTI's in the last year. Yeast infection x 2 describe as skin itching treated with nystatin, diflucan and triamcinolone cream last in 07/15/24. Positive wet prep Denies history of blood in urine, pyelonephritis, bladder cancer, and kidney cancer Reports kidney stone treated outpatient without surgical intervention around 20 years ago No results found for the last 90 days.   Pelvic Organ Prolapse Symptoms:                  Patient Admits to a feeling of a bulge the vaginal area. It has been present for 5 years.  Patient Admits to seeing a bulge.  This bulge is bothersome.  Bowel Symptom: Bowel movements: 2-3 time(s) per day Stool consistency: hard or soft , type IV stool  Straining: yes 1x/week Splinting: no.  Incomplete evacuation: no.  Patient Denies accidental bowel leakage / fecal incontinence Bowel regimen: none Last colonoscopy: Results polyps, denies need for repeat HM Colonoscopy          Completed or No Longer Recommended     Colonoscopy  Discontinued      Frequency changed to Never automatically (Topic No Longer Applies)   12/19/2022  COLONOSCOPY  Only the first 1 history entries have been loaded, but more history exists.  Sexual Function Sexually active: no.  Sexual orientation: Straight Pain with sex: No  Pelvic Pain Denies pelvic pain   Past Medical History:  Past Medical History:  Diagnosis Date   Allergy    Arthritis    Cancer (HCC)    BREAST RIGHT   Cataract    Cystocele    Fibroids    Gastric reflux    Generalized headaches    GERD (gastroesophageal reflux disease)    H. pylori infection    8 years ago   Heart murmur    Mild mitral regurgitation- pt denies this 07-02-16 at her [facility]   Hiatal hernia    Hypertension    Obstructive sleep apnea    uses CPAP   Pain in limb    Sleep apnea    uses CPAP   Thyroid  disease    Tubular adenoma of colon    Varicose  veins      Past Surgical History:   Past Surgical History:  Procedure Laterality Date   BREAST SURGERY Right    SEPTEMBER 12 TH 2023   BREATH TEK H PYLORI N/A 05/14/2016   Procedure: BREATH SHIRLEAN VEAR LORA;  Surgeon: Gordy CHRISTELLA Starch, MD;  Location: [facility];  Service: Gastroenterology;  Laterality: N/A;   COLONOSCOPY     Dr Geroge   ESOPHAGOGASTRODUODENOSCOPY     Dr Geroge BEAL, ADENOIDECTOMY, BILATERAL MYRINGOTOMY AND TUBES     UPPER GASTROINTESTINAL ENDOSCOPY       Past OB/GYN History: OB History  Gravida Para Term Preterm AB Living  2 2 1   0 1  SAB IAB Ectopic Multiple Live Births  0    1    # Outcome Date GA Lbr Len/2nd Weight Sex Type Anes PTL Lv  2 Term     M Vag-Spont   LIV  1 Para             Vaginal deliveries: son was 5lb, denies laceration repaired,  Forceps/ Vacuum deliveries: 0, Cesarean section: 0 Menopausal: Yes, at age 78, Denies vaginal bleeding since menopause Contraception: s/p menopause. Last pap smear was NILM 09/12/20.  Any history of abnormal pap smears: no per patient, history of LEEP 2003 No results found for: DIAGPAP, HPVHIGH, ADEQPAP  Medications: Patient has a current medication list which includes the following prescription(s): ascorbic acid, vitamin b complex, cholecalciferol, [START ON 11/12/2024] estradiol , liothyronine , magnesium, meloxicam, nebivolol , omega-3 fatty acids, and synthroid .   Allergies: Patient is allergic to amlodipine , amoxicillin, pneumococcal 13-val conj vacc, prevnar 13 [pneumococcal 13-val conj vacc], tetanus-diphth-acell pertussis, codeine, and tetanus-diphth-acell pertussis.   Social History: Social History[1]  Relationship status: divorced Patient lives with her sister.   Patient is employed as a public librarian. Regular exercise: No History of abuse: No  Family History:   Family History  Problem Relation Age of Onset   Kidney disease Mother    Throat cancer Father        worked at [employer]    Hypothyroidism Sister    Atrial fibrillation Sister 4   Heart failure Brother    Hypertension Brother    Heart failure Brother    Hypertension Brother    Colon cancer Neg Hx    Esophageal cancer Neg Hx    Rectal cancer Neg Hx    Stomach cancer Neg Hx    Colon polyps Neg Hx    Uterine cancer Neg Hx    Ovarian cancer Neg Hx    Crohn's disease Neg Hx    Ulcerative  colitis Neg Hx    Bladder Cancer Neg Hx    Renal cancer Neg Hx      Review of Systems: Review of Systems  Constitutional:  Negative for fever, malaise/fatigue and weight loss.       Weight gain  Respiratory:  Negative for cough, shortness of breath and wheezing.   Cardiovascular:  Positive for leg swelling. Negative for chest pain and palpitations.  Gastrointestinal:  Positive for constipation. Negative for abdominal pain and blood in stool.  Genitourinary:  Positive for frequency and urgency. Negative for dysuria and hematuria.       Leakage, bulge  Skin:  Negative for rash.  Neurological:  Negative for dizziness, weakness and headaches.  Endo/Heme/Allergies:  Does not bruise/bleed easily.  Psychiatric/Behavioral:  Negative for depression. The patient is not nervous/anxious.      OBJECTIVE Physical Exam: Vitals:   11/10/24 0934 11/10/24 0955  BP: (!) 148/80 (!) 152/85  Pulse: (!) 58 (!) 58  Weight: 229 lb (103.9 kg)   Height: 5' 1.42 (1.56 m)    Physical Exam Constitutional:      General: She is not in acute distress.    Appearance: Normal appearance.  Genitourinary:     Bladder and urethral meatus normal.     No lesions in the vagina.     Genitourinary Comments: No loss of hair or loss of vulvar anatomy A size 5 ring with support pessary was fitted. It was comfortable, stayed in place with valsalva on commode and was an appropriate size on examination, with one finger fitting between the pessary and the vaginal walls. LOT [id number]     Right Labia: No rash, tenderness, lesions, skin changes or Bartholin's  cyst.    Left Labia: No tenderness, lesions, skin changes, Bartholin's cyst or rash.    No vaginal discharge, erythema, tenderness, bleeding, ulceration or granulation tissue.     Anterior and apical vaginal prolapse present.    Mild vaginal atrophy present.     Right Adnexa: not tender, not full and no mass present.    Left Adnexa: not tender, not full and no mass present.    No cervical motion tenderness, discharge, friability, lesion, polyp or nabothian cyst.     Uterus is enlarged (around 13-14 week size, limited by habitus), fixed (limited mobility, fullness palpated in posterior cul-de-sac with no nodularity) and prolapsed.     Uterus is not tender or irregular.     No uterine mass detected.    Urethral meatus caruncle not present.    No urethral prolapse, tenderness, mass, hypermobility, discharge or stress urinary incontinence with cough stress test present.     Bladder is not tender, urgency on palpation not present and masses not present.      Levator ani not tender, obturator internus not tender, no asymmetrical contractions present and no pelvic spasms present.    Symmetrical pelvic sensation, anal wink present and BC reflex present. Cardiovascular:     Rate and Rhythm: Normal rate.  Pulmonary:     Effort: Pulmonary effort is normal. No respiratory distress.  Abdominal:     General: There is no distension.     Palpations: Abdomen is soft. There is mass.     Tenderness: There is abdominal tenderness (bilateral lower quadrants).     Hernia: No hernia is present.  Neurological:     Mental Status: She is alert.  Vitals reviewed. Exam conducted with a chaperone present.      POP-Q:   POP-Q  4  Aa   4                                           Ba  -5                                              C   5                                            Gh  3                                            Pb  9                                             tvl   -3                                            Ap  -3                                            Bp  -6                                              D    Post-Void Residual (PVR) by Bladder Scan: In order to evaluate bladder emptying, we discussed obtaining a postvoid residual and patient agreed to this procedure.  Procedure: The ultrasound unit was placed on the patient's abdomen in the suprapubic region after the patient had voided.    Post Void Residual - 11/10/24 0956       Post Void Residual   Post Void Residual 23 mL         Straight Catheterization Procedure for PVR: After verbal consent was obtained from the patient for catheterization to assess bladder emptying and residual volume the urethra and surrounding tissues were prepped with betadine and an in and out catheterization was performed.  PVR was 40mL.  Urine appeared clear yellow. The patient tolerated the procedure well.   Laboratory Results: Lab Results  Component Value Date   COLORU yellow 11/10/2024   CLARITYU clear 11/10/2024   GLUCOSEUR negative 11/10/2024   BILIRUBINUR negative 11/10/2024   KETONESU neg 01/16/2014   SPECGRAV 1.020 11/10/2024   RBCUR negative 11/10/2024   PHUR 6.0 11/10/2024   PROTEINUR 30 (A) 06/22/2017   UROBILINOGEN 0.2 11/10/2024   LEUKOCYTESUR Negative 11/10/2024    Lab Results  Component Value Date   CREATININE 0.90 04/29/2023   CREATININE 0.82 05/01/2022   CREATININE 0.89 08/15/2021  Lab Results  Component Value Date   HGBA1C 6.2 10/29/2022    Lab Results  Component Value Date   HGB 13.1 08/15/2021     ASSESSMENT AND PLAN Ms. Dershem is a 78 y.o. with:  1. Urge urinary incontinence   2. Pelvic organ prolapse quantification stage 3 cystocele   3. Vaginal atrophy   4. Fibroids   5. Vaginal itching   6. History of loop electrosurgical excision procedure (LEEP) of cervix   7. BMI 40.0-44.9, adult (HCC)     Urge urinary  incontinence Assessment & Plan: - POCT UA negative, PVR 40mL We discussed the symptoms of overactive bladder (OAB), which include urinary urgency, urinary frequency, nocturia, with or without urge incontinence.  While we do not know the exact etiology of OAB, several treatment options exist. We discussed management including behavioral therapy (decreasing bladder irritants, urge suppression strategies, timed voids, bladder retraining), physical therapy, medication; for refractory cases posterior tibial nerve stimulation, sacral neuromodulation, and intravesical botulinum toxin injection.  For anticholinergic medications, we discussed the potential side effects of anticholinergics including dry eyes, dry mouth, constipation, cognitive impairment and urinary retention. For Beta-3 agonist medication, we discussed the potential side effect of elevated blood pressure which is more likely to occur in individuals with uncontrolled hypertension. - encouraged to reduce caffeine use due to association with urinary frequency - reassess after pessary placement - will need urodynamics prior to surgery - encouraged weight reduction and monitor blood sugar control, last HbA1C 6.2 in 2023  Orders: -     POCT URINALYSIS DIP (CLINITEK) -     AMB referral to rehabilitation  Pelvic organ prolapse quantification stage 3 cystocele Assessment & Plan: - evaluated by Dr. Alvia and Dr. Gaston last in 2022 - prior use of size 4 and 5 ring with support pessaries with dental floss, discontinued due to vaginal odor and difficulty with self management - For treatment of pelvic organ prolapse, we discussed options for management including expectant management, conservative management, and surgical management, such as Kegels, a pessary, pelvic floor physical therapy, and specific surgical procedures. - pt desired to resume size 5 ring with support pessary use, return for pessary check in 2-4 weeks - discussed risk of  change in urinary or bowel symptoms, vaginal ulceration, discharge, bleeding, fistula formation. Explained that pt may require multiple sizes and types for fitting.  - encouraged pt to purchase pessary assistance and bring to follow-up visit if she experiences difficulty with self management We discussed two options for prolapse repair:  1) vaginal repair without mesh - Pros - safer, no mesh complications - Cons - not as strong as mesh repair, higher risk of recurrence  2) laparoscopic repair with mesh - Pros - stronger, better long-term success - Cons - risks of mesh implant (erosion into vagina or bladder, adhering to the rectum, pain) - these risks are lower than with a vaginal mesh but still exist  3) Vaginal closure procedure - pros - strong, good long-term success - cons - unable to have penetrative intercourse  - desires to avoid mesh use - not sexually active - advised against uterine preservation due to history of LEEP and leiomyoma   Orders: -     AMB referral to rehabilitation -     Estradiol ; Place 0.5g nightly for two weeks then twice a week after  Dispense: 30 g; Refill: 3  Vaginal atrophy Assessment & Plan: - For symptomatic vaginal atrophy options include lubrication with a water-based lubricant, personal hygiene measures  and barrier protection against wetness, and estrogen replacement in the form of vaginal cream, vaginal tablets, or a time-released vaginal ring.   - Rx low dose vaginal estrogen  Orders: -     Estradiol ; Place 0.5g nightly for two weeks then twice a week after  Dispense: 30 g; Refill: 3  Fibroids Assessment & Plan: - known postmenopausal slow growing fibroid uterus with prior evaluation by Dr. Eloy and Dr. Viktoria, last in 09/2020 - TVUS 04/18/21 showed 14.5 x 4.9 x 11.0 cm uterus with largest 8.3 x 8.8 x 8.6 cm fibroid and ES 12mm.  - offered pelvic ultrasound every 4-6 months, pelvic MRI, and endometrial biopsy - minimal mobility on exam with  fullness in posterior cul-de-sac - discussed need to follow-up with Dr. Viktoria to review next steps - TVUS ordered to reassess fibroids  - discussed risk of uterine preservation and need for additional intervention   Orders: -     US  PELVIS TRANSVAGINAL NON-OB (TV ONLY); Future  Vaginal itching Assessment & Plan: - pending Nuswab to r/o candida glabrata - multiple treatments of diflucan in the past  Orders: -     Cervicovaginal ancillary only  History of loop electrosurgical excision procedure (LEEP) of cervix Assessment & Plan: - h/o LEEP in 2003 - last pap NILM 09/12/20 - advised against obliterative procedure with uterine preservation due to inability to evaluate cervix   BMI 40.0-44.9, adult (HCC) Assessment & Plan: - encouraged weight reduction due to association with pelvic floor disorders, risk of recurrence, and perioperative risks - pt reports prior diet with 40lb weight loss, desires to resume 11/2024 - discussed association of weight reduction with reduced urinary leakage symptoms    Time spent: I spent 72 minutes dedicated to the care of this patient on the date of this encounter to include pre-visit review of records, face-to-face time with the patient discussing stage III pelvic organ prolapse, fibroids, urgency urinary incontinence, BMI, h/o LEEP, vaginal atrophy, vaginal itching, and post visit documentation and ordering medication/ testing.   [name], MD        [1]  Social History Tobacco Use   Smoking status: Never    Passive exposure: Never   Smokeless tobacco: Never  Vaping Use   Vaping status: Never Used  Substance Use Topics   Alcohol use: No   Drug use: Never

## 2024-11-10 NOTE — Assessment & Plan Note (Signed)
-   For symptomatic vaginal atrophy options include lubrication with a water-based lubricant, personal hygiene measures and barrier protection against wetness, and estrogen replacement in the form of vaginal cream, vaginal tablets, or a time-released vaginal ring.   - Rx low dose vaginal estrogen

## 2024-11-10 NOTE — Patient Instructions (Addendum)
 You have a stage 3 (out of 4) prolapse.  We discussed the fact that it is not life threatening but there are several treatment options. For treatment of pelvic organ prolapse, we discussed options for management including expectant management, conservative management, and surgical management, such as Kegels, a pessary, pelvic floor physical therapy, and specific surgical procedures.     You are opting to try a size 5 ring with support pessary. This will keep the bulge inside and prevent it from getting worse. You can learn how to remove it yourself or we can do that for you every 3 months.   Please purchase a pessary assistant  For vaginal atrophy (thinning of the vaginal tissue that can cause dryness and burning) and UTI prevention we discussed estrogen replacement in the form of vaginal cream.   Start vaginal estrogen therapy nightly for two weeks then 2 times weekly at night. This can be placed with your finger or an applicator inside the vagina and around the urethra.  Please let us  know if the prescription is too expensive and we can look for alternative options.   Is vaginal estrogen therapy safe for me? Vaginal estrogen preparations act on the vaginal skin, and only a very tiny amount is absorbed into the bloodstream (0.01%).  They work in a similar way to hand or face cream.  There is minimal absorption and they are therefore perfectly safe. If you have had breast cancer and have persistent troublesome symptoms which aren't settling with vaginal moisturisers and lubricants, local estrogen treatment may be a possibility, but consultation with your oncologist should take place first.   We discussed the symptoms of overactive bladder (OAB), which include urinary urgency, urinary frequency, night-time urination, with or without urge incontinence.  We discussed management including behavioral therapy (decreasing bladder irritants by following a bladder diet, urge suppression strategies, timed voids,  bladder retraining), physical therapy, medication; and for refractory cases posterior tibial nerve stimulation, sacral neuromodulation, and intravesical botulinum toxin injection.   For night time frequency: - avoid fluid intake 3 hours before bedtime - elevated your feet during the day or use compression socks to reduce lower extremity swelling - due to snoring, continue CPAP for sleep apnea  Constipation: Our goal is to achieve formed bowel movements daily or every-other-day.  You may need to try different combinations of the following options to find what works best for you - everybody's body works differently so feel free to adjust the dosages as needed.  Some options to help maintain bowel health include:  Dietary changes (more leafy greens, vegetables and fruits; less processed foods) Fiber supplementation (Benefiber, FiberCon, Metamucil or Psyllium). Start slow and increase gradually to full dose. Over-the-counter agents such as: stool softeners (Docusate or Colace) and/or laxatives (Miralax, milk of magnesia)  Power Pudding is a natural mixture that may help your constipation.  To make blend 1 cup applesauce, 1 cup wheat bran, and 3/4 cup prune juice, refrigerate and then take 1 tablespoon daily with a large glass of water as needed.   Women should try to eat at least 21 to 25 grams of fiber a day, while men should aim for 30 to 38 grams a day. You can add fiber to your diet with food or a fiber supplement such as psyllium (metamucil), benefiber, or fibercon.   Here's a look at how much dietary fiber is found in some common foods. When buying packaged foods, check the Nutrition Facts label for fiber content. It can vary among  brands.  Fruits Serving size Total fiber (grams)*  Raspberries 1 cup 8.0  Pear 1 medium 5.5  Apple, with skin 1 medium 4.5  Banana 1 medium 3.0  Orange 1 medium 3.0  Strawberries 1 cup 3.0   Vegetables Serving size Total fiber (grams)*  Green peas, boiled 1  cup 9.0  Broccoli, boiled 1 cup chopped 5.0  Turnip greens, boiled 1 cup 5.0  Brussels sprouts, boiled 1 cup 4.0  Potato, with skin, baked 1 medium 4.0  Sweet corn, boiled 1 cup 3.5  Cauliflower, raw 1 cup chopped 2.0  Carrot, raw 1 medium 1.5   Grains Serving size Total fiber (grams)*  Spaghetti, whole-wheat, cooked 1 cup 6.0  Barley, pearled, cooked 1 cup 6.0  Bran flakes 3/4 cup 5.5  Quinoa, cooked 1 cup 5.0  Oat bran muffin 1 medium 5.0  Oatmeal, instant, cooked 1 cup 5.0  Popcorn, air-popped 3 cups 3.5  Brown rice, cooked 1 cup 3.5  Bread, whole-wheat 1 slice 2.0  Bread, rye 1 slice 2.0   Legumes, nuts and seeds Serving size Total fiber (grams)*  Split peas, boiled 1 cup 16.0  Lentils, boiled 1 cup 15.5  Black beans, boiled 1 cup 15.0  Baked beans, canned 1 cup 10.0  Chia seeds 1 ounce 10.0  Almonds 1 ounce (23 nuts) 3.5  Pistachios 1 ounce (49 nuts) 3.0  Sunflower kernels 1 ounce 3.0  *Rounded to nearest 0.5 gram. Source: [employer] for [employer], [employer]    Please call [phone] to schedule the earliest appointment for pelvic floor PT.  Please call radiology at [phone] to schedule your imaging study today

## 2024-11-10 NOTE — Assessment & Plan Note (Signed)
-   h/o LEEP in 2003 - last pap NILM 09/12/20 - advised against obliterative procedure with uterine preservation due to inability to evaluate cervix

## 2024-11-10 NOTE — Assessment & Plan Note (Addendum)
-   evaluated by Dr. Alvia and Dr. Gaston last in 2022 - prior use of size 4 and 5 ring with support pessaries with dental floss, discontinued due to vaginal odor and difficulty with self management - For treatment of pelvic organ prolapse, we discussed options for management including expectant management, conservative management, and surgical management, such as Kegels, a pessary, pelvic floor physical therapy, and specific surgical procedures. - pt desired to resume size 5 ring with support pessary use, return for pessary check in 2-4 weeks - discussed risk of change in urinary or bowel symptoms, vaginal ulceration, discharge, bleeding, fistula formation. Explained that pt may require multiple sizes and types for fitting.  - encouraged pt to purchase pessary assistance and bring to follow-up visit if she experiences difficulty with self management We discussed two options for prolapse repair:  1) vaginal repair without mesh - Pros - safer, no mesh complications - Cons - not as strong as mesh repair, higher risk of recurrence  2) laparoscopic repair with mesh - Pros - stronger, better long-term success - Cons - risks of mesh implant (erosion into vagina or bladder, adhering to the rectum, pain) - these risks are lower than with a vaginal mesh but still exist  3) Vaginal closure procedure - pros - strong, good long-term success - cons - unable to have penetrative intercourse  - desires to avoid mesh use - not sexually active - advised against uterine preservation due to history of LEEP and leiomyoma

## 2024-11-10 NOTE — Assessment & Plan Note (Addendum)
-   known postmenopausal slow growing fibroid uterus with prior evaluation by Dr. Eloy and Dr. Viktoria, last in 09/2020 - TVUS 04/18/21 showed 14.5 x 4.9 x 11.0 cm uterus with largest 8.3 x 8.8 x 8.6 cm fibroid and ES 12mm.  - offered pelvic ultrasound every 4-6 months, pelvic MRI, and endometrial biopsy - minimal mobility on exam with fullness in posterior cul-de-sac - discussed need to follow-up with Dr. Viktoria to review next steps - TVUS ordered to reassess fibroids  - discussed risk of uterine preservation and need for additional intervention

## 2024-11-11 ENCOUNTER — Ambulatory Visit: Payer: Self-pay | Admitting: Obstetrics

## 2024-11-11 LAB — CERVICOVAGINAL ANCILLARY ONLY
Bacterial Vaginitis (gardnerella): NEGATIVE
Candida Glabrata: NEGATIVE
Candida Vaginitis: NEGATIVE
Comment: NEGATIVE
Comment: NEGATIVE
Comment: NEGATIVE

## 2024-11-16 ENCOUNTER — Ambulatory Visit (HOSPITAL_COMMUNITY)
Admission: RE | Admit: 2024-11-16 | Discharge: 2024-11-16 | Disposition: A | Discharge status: Home / Self Care | Source: Ambulatory Visit | Attending: Obstetrics | Admitting: Obstetrics

## 2024-11-16 ENCOUNTER — Ambulatory Visit: Admitting: Obstetrics

## 2024-11-16 ENCOUNTER — Encounter: Payer: Self-pay | Admitting: Obstetrics

## 2024-11-16 VITALS — BP 167/76 | HR 73

## 2024-11-16 DIAGNOSIS — D259 Leiomyoma of uterus, unspecified: Secondary | ICD-10-CM

## 2024-11-16 DIAGNOSIS — N952 Postmenopausal atrophic vaginitis: Secondary | ICD-10-CM

## 2024-11-16 DIAGNOSIS — D219 Benign neoplasm of connective and other soft tissue, unspecified: Secondary | ICD-10-CM | POA: Diagnosis present

## 2024-11-16 DIAGNOSIS — N8110 Cystocele, unspecified: Secondary | ICD-10-CM

## 2024-11-16 DIAGNOSIS — Z960 Presence of urogenital implants: Secondary | ICD-10-CM | POA: Diagnosis not present

## 2024-11-16 NOTE — Assessment & Plan Note (Addendum)
-   evaluated by Dr. Alvia and Dr. Gaston last in 2022 - prior use of size 4 and 5 ring with support pessaries with dental floss, discontinued due to vaginal odor and difficulty with self management - For treatment of pelvic organ prolapse, we discussed options for management including expectant management, conservative management, and surgical management, such as Kegels, a pessary, pelvic floor physical therapy, and specific surgical procedures. - removed and replaced size 5 ring with support pessary around TVUS, denies vaginal bulge symptoms or discomfort. Reassess symptoms and consider refitting at follow-up after treatment of vaginal atrophy - discussed risk of change in urinary or bowel symptoms, vaginal ulceration, discharge, bleeding, fistula formation. Explained that pt may require multiple sizes and types for fitting.  - encouraged pt to purchase pessary assistance and bring to follow-up visit if she experiences difficulty with self management We discussed two options for prolapse repair:  1) vaginal repair without mesh - Pros - safer, no mesh complications - Cons - not as strong as mesh repair, higher risk of recurrence  2) laparoscopic repair with mesh - Pros - stronger, better long-term success - Cons - risks of mesh implant (erosion into vagina or bladder, adhering to the rectum, pain) - these risks are lower than with a vaginal mesh but still exist  3) Vaginal closure procedure - pros - strong, good long-term success - cons - unable to have penetrative intercourse  - desires to avoid mesh use - not sexually active - advised against uterine preservation due to history of LEEP and leiomyoma, pending TVUS

## 2024-11-16 NOTE — Patient Instructions (Signed)
 Continue vaginal estrogen 1g twice a week.   Start fiber supplementation Women should try to eat at least 21 to 25 grams of fiber a day, while men should aim for 30 to 38 grams a day. You can add fiber to your diet with food or a fiber supplement such as psyllium (metamucil), benefiber, or fibercon.   Here's a look at how much dietary fiber is found in some common foods. When buying packaged foods, check the Nutrition Facts label for fiber content. It can vary among brands.  Fruits Serving size Total fiber (grams)*  Raspberries 1 cup 8.0  Pear 1 medium 5.5  Apple, with skin 1 medium 4.5  Banana 1 medium 3.0  Orange 1 medium 3.0  Strawberries 1 cup 3.0   Vegetables Serving size Total fiber (grams)*  Green peas, boiled 1 cup 9.0  Broccoli, boiled 1 cup chopped 5.0  Turnip greens, boiled 1 cup 5.0  Brussels sprouts, boiled 1 cup 4.0  Potato, with skin, baked 1 medium 4.0  Sweet corn, boiled 1 cup 3.5  Cauliflower, raw 1 cup chopped 2.0  Carrot, raw 1 medium 1.5   Grains Serving size Total fiber (grams)*  Spaghetti, whole-wheat, cooked 1 cup 6.0  Barley, pearled, cooked 1 cup 6.0  Bran flakes 3/4 cup 5.5  Quinoa, cooked 1 cup 5.0  Oat bran muffin 1 medium 5.0  Oatmeal, instant, cooked 1 cup 5.0  Popcorn, air-popped 3 cups 3.5  Brown rice, cooked 1 cup 3.5  Bread, whole-wheat 1 slice 2.0  Bread, rye 1 slice 2.0   Legumes, nuts and seeds Serving size Total fiber (grams)*  Split peas, boiled 1 cup 16.0  Lentils, boiled 1 cup 15.5  Black beans, boiled 1 cup 15.0  Baked beans, canned 1 cup 10.0  Chia seeds 1 ounce 10.0  Almonds 1 ounce (23 nuts) 3.5  Pistachios 1 ounce (49 nuts) 3.0  Sunflower kernels 1 ounce 3.0  *Rounded to nearest 0.5 gram. Source: [employer] for [employer], [employer] [place]

## 2024-11-16 NOTE — Progress Notes (Signed)
 Sherry Bell Return Visit  SUBJECTIVE  History of Present Illness: Sherry Bell is a 78 y.o. female seen in follow-up for stage III pelvic organ prolapse, fibroids, urgency urinary incontinence, BMI, h/o LEEP, vaginal atrophy, and vaginal itching. Plan at last visit was trial of size 5 ring with support pessary and vaginal estrogen.   Presented for pessary removal prior to TVUS  Pending TVUS to evaluate h/o postmenopausal slowly enlarging fibroid uterus last seen by Dr. Viktoria in 2021 Reports concerns with vaginal estrogen use due to h/o ER+ Stage IA R breast cancer s/p letrozole  Past Medical History: Patient  has a past medical history of Allergy, Arthritis, Cancer (HCC), Cataract, Cystocele, Fibroids, Gastric reflux, Generalized headaches, GERD (gastroesophageal reflux disease), H. pylori infection, Heart murmur, Hiatal hernia, Hypertension, Obstructive sleep apnea, Pain in limb, Sleep apnea, Thyroid  disease, Tubular adenoma of colon, and Varicose veins.   Past Surgical History: She  has a past surgical history that includes Tonsilectomy, adenoidectomy, bilateral myringotomy and tubes; Colonoscopy; Esophagogastroduodenoscopy; Breath tek h pylori (N/A, 05/14/2016); Upper gastrointestinal endoscopy; and Breast surgery (Right).   Medications: She has a current medication list which includes the following prescription(s): ascorbic acid, vitamin b complex, cholecalciferol, estradiol , liothyronine , magnesium, meloxicam, nebivolol , omega-3 fatty acids, and synthroid .   Allergies: Patient is allergic to amlodipine , amoxicillin, pneumococcal 13-val conj vacc, prevnar 13 [pneumococcal 13-val conj vacc], tetanus-diphth-acell pertussis, codeine, and tetanus-diphth-acell pertussis.   Social History: Patient  reports that she has never smoked. She has never been exposed to tobacco smoke. She has never used smokeless tobacco. She reports that she does not drink alcohol and does not use  drugs.     OBJECTIVE     Physical Exam: Vitals:   11/16/24 1221  BP: (!) 167/76  Pulse: 73   Gen: No apparent distress, A&O x 3. Pelvic Exam: Normal external female genitalia; Bartholin's and Skene's glands normal in appearance; urethral meatus normal in appearance, no urethral masses or discharge. The size 5 ring pessary was noted to be in place. It was removed and cleaned. Speculum exam revealed no lesions in the vagina.       ASSESSMENT AND PLAN    Sherry Bell is a 78 y.o. with:  1. Vaginal atrophy   2. Pelvic organ prolapse quantification stage 3 cystocele   3. Fibroids     Vaginal atrophy Assessment & Plan: - For symptomatic vaginal atrophy options include lubrication with a water-based lubricant, personal hygiene measures and barrier protection against wetness, and estrogen replacement in the form of vaginal cream, vaginal tablets, or a time-released vaginal ring.   - continue low dose vaginal estrogen - We discussed the potential risks associated with hormone replacement including stroke, heart attack, and blood clots; and the fact that these risks are very low with vaginal estrogen use due to the very low systemic absorption rate of ~ 0.01% with a twice-week regimen. Discussed lack of association with cancer recurrence in breast cancer patients.   Pelvic organ prolapse quantification stage 3 cystocele Assessment & Plan: - evaluated by Dr. Alvia and Dr. Gaston last in 2022 - prior use of size 4 and 5 ring with support pessaries with dental floss, discontinued due to vaginal odor and difficulty with self management - For treatment of pelvic organ prolapse, we discussed options for management including expectant management, conservative management, and surgical management, such as Kegels, a pessary, pelvic floor physical therapy, and specific surgical procedures. - removed and replaced size 5 ring with support pessary around  TVUS, denies vaginal bulge symptoms or  discomfort. Reassess symptoms and consider refitting at follow-up after treatment of vaginal atrophy - discussed risk of change in urinary or bowel symptoms, vaginal ulceration, discharge, bleeding, fistula formation. Explained that pt may require multiple sizes and types for fitting.  - encouraged pt to purchase pessary assistance and bring to follow-up visit if she experiences difficulty with self management We discussed two options for prolapse repair:  1) vaginal repair without mesh - Pros - safer, no mesh complications - Cons - not as strong as mesh repair, higher risk of recurrence  2) laparoscopic repair with mesh - Pros - stronger, better long-term success - Cons - risks of mesh implant (erosion into vagina or bladder, adhering to the rectum, pain) - these risks are lower than with a vaginal mesh but still exist  3) Vaginal closure procedure - pros - strong, good long-term success - cons - unable to have penetrative intercourse  - desires to avoid mesh use - not sexually active - advised against uterine preservation due to history of LEEP and leiomyoma, pending TVUS    Fibroids Assessment & Plan: - known postmenopausal slow growing fibroid uterus with prior evaluation by Dr. Eloy and Dr. Viktoria, last in 09/2020 - TVUS 04/18/21 showed 14.5 x 4.9 x 11.0 cm uterus with largest 8.3 x 8.8 x 8.6 cm fibroid and ES 12mm.  - offered pelvic ultrasound every 4-6 months, pelvic MRI, and endometrial biopsy - minimal mobility on exam with fullness in posterior cul-de-sac - reviewed with Dr. Viktoria to review next steps pending TVUS to reassess fibroids  - discussed risk of uterine preservation and need for additional intervention     Sherry ONEIDA Gillis, MD  Patient returned after TVUS and the pessary was replaced. Some discomfort was noted during replacement. It was comfortable to the patient and fit well.

## 2024-11-16 NOTE — Assessment & Plan Note (Signed)
-   For symptomatic vaginal atrophy options include lubrication with a water-based lubricant, personal hygiene measures and barrier protection against wetness, and estrogen replacement in the form of vaginal cream, vaginal tablets, or a time-released vaginal ring.   - continue low dose vaginal estrogen - We discussed the potential risks associated with hormone replacement including stroke, heart attack, and blood clots; and the fact that these risks are very low with vaginal estrogen use due to the very low systemic absorption rate of ~ 0.01% with a twice-week regimen. Discussed lack of association with cancer recurrence in breast cancer patients.

## 2024-11-16 NOTE — Assessment & Plan Note (Addendum)
-   known postmenopausal slow growing fibroid uterus with prior evaluation by Dr. Eloy and Dr. Viktoria, last in 09/2020 - TVUS 04/18/21 showed 14.5 x 4.9 x 11.0 cm uterus with largest 8.3 x 8.8 x 8.6 cm fibroid and ES 12mm.  - offered pelvic ultrasound every 4-6 months, pelvic MRI, and endometrial biopsy - minimal mobility on exam with fullness in posterior cul-de-sac - reviewed with Dr. Viktoria to review next steps pending TVUS to reassess fibroids  - discussed risk of uterine preservation and need for additional intervention

## 2024-11-27 ENCOUNTER — Ambulatory Visit: Admitting: Obstetrics

## 2024-11-27 ENCOUNTER — Encounter: Payer: Self-pay | Admitting: Obstetrics

## 2024-11-27 VITALS — BP 132/80 | HR 54

## 2024-11-27 DIAGNOSIS — Z9889 Other specified postprocedural states: Secondary | ICD-10-CM | POA: Diagnosis not present

## 2024-11-27 DIAGNOSIS — N8110 Cystocele, unspecified: Secondary | ICD-10-CM

## 2024-11-27 DIAGNOSIS — N952 Postmenopausal atrophic vaginitis: Secondary | ICD-10-CM

## 2024-11-27 DIAGNOSIS — D259 Leiomyoma of uterus, unspecified: Secondary | ICD-10-CM

## 2024-11-27 DIAGNOSIS — N3941 Urge incontinence: Secondary | ICD-10-CM | POA: Diagnosis not present

## 2024-11-27 DIAGNOSIS — D219 Benign neoplasm of connective and other soft tissue, unspecified: Secondary | ICD-10-CM

## 2024-11-27 NOTE — Assessment & Plan Note (Signed)
-   evaluated by Dr. Alvia and Dr. Gaston last in 2022 - prior use of size 4 and 5 ring with support pessaries with dental floss, discontinued due to vaginal odor and difficulty with self management - For treatment of pelvic organ prolapse, we discussed options for management including expectant management, conservative management, and surgical management, such as Kegels, a pessary, pelvic floor physical therapy, and specific surgical procedures. - pt desires to continue office management of size 5 ring with support pessary. Declined refitting for incontinence pessary at this time. Continue to monitor - pending TVUS results - discussed risk of change in urinary or bowel symptoms, vaginal ulceration, discharge, bleeding, fistula formation. Explained that pt may require multiple sizes and types for fitting. Return in 3 months for pessary check or sooner if clinical change - encouraged pt to purchase pessary assistance and bring to follow-up visit if she experiences difficulty with self management We discussed two options for prolapse repair:  1) vaginal repair without mesh - Pros - safer, no mesh complications - Cons - not as strong as mesh repair, higher risk of recurrence  2) laparoscopic repair with mesh - Pros - stronger, better long-term success - Cons - risks of mesh implant (erosion into vagina or bladder, adhering to the rectum, pain) - these risks are lower than with a vaginal mesh but still exist  3) Vaginal closure procedure - pros - strong, good long-term success - cons - unable to have penetrative intercourse  - desires to avoid mesh use - not sexually active - advised against uterine preservation due to history of LEEP and leiomyoma, pending TVUS - continue pessary management at this time

## 2024-11-27 NOTE — Assessment & Plan Note (Signed)
-   known postmenopausal slow growing fibroid uterus with prior evaluation by Dr. Eloy and Dr. Viktoria, last in 09/2020 - TVUS 04/18/21 showed 14.5 x 4.9 x 11.0 cm uterus with largest 8.3 x 8.8 x 8.6 cm fibroid and ES 12mm.  - offered pelvic ultrasound every 4-6 months, pelvic MRI, and endometrial biopsy - minimal mobility on exam with fullness in posterior cul-de-sac - reviewed with Dr. Viktoria to review next steps pending TVUS to reassess fibroids, encouraged to follow-up with Dr. Viktoria due to loss of follow-up since 2021  - discussed risk of uterine preservation and need for additional intervention

## 2024-11-27 NOTE — Patient Instructions (Addendum)
 For vaginal atrophy: - discussed proper vulvar care, warm compression, avoid pad use, cotton only underwear and barrier ointment if needed  - encouraged Vit E suppository, moisturizer with Replens/Revaree  For prolapse: Continue size 5 support pessary. - discussed risk of change in urinary or bowel symptoms, vaginal ulceration, discharge, bleeding, fistula formation. Please return in 3 months for pessary check.   For constipation: Continue fiber supplementation to reduce straining.   Please follow-up with Dr. Viktoria regarding your transvaginal ultrasound results.

## 2024-11-27 NOTE — Assessment & Plan Note (Signed)
-   For symptomatic vaginal atrophy options include lubrication with a water-based lubricant, personal hygiene measures and barrier protection against wetness, and estrogen replacement in the form of vaginal cream, vaginal tablets, or a time-released vaginal ring.   - pt discontinued low dose vaginal estrogen  - We reviewed the potential risks associated with hormone replacement including stroke, heart attack, and blood clots; and the fact that these risks are very low with vaginal estrogen use due to the very low systemic absorption rate of ~ 0.01% with a twice-week regimen. Discussed lack of association with cancer recurrence in breast cancer patients.  - discussed proper vulvar care, warm compression, avoid pad use, cotton only underwear and barrier ointment if needed  - encouraged Vit E suppository, moisturizer with Replens/Revaree - encouraged low dose vaginal estrogen 0.5g twice a week if she desires to proceed

## 2024-11-27 NOTE — Assessment & Plan Note (Addendum)
-   h/o LEEP in 2003 - last pap NILM 09/12/20 - advised against obliterative procedure with uterine preservation due to inability to evaluate cervix - referred to GYN to establish care per pt request

## 2024-11-27 NOTE — Progress Notes (Signed)
 [facility] Return Visit  SUBJECTIVE  History of Present Illness: Sherry Bell is a 79 y.o. female seen in follow-up for  stage III pelvic organ prolapse, fibroids, urgency urinary incontinence, BMI, h/o LEEP, vaginal atrophy, and vaginal itching. Plan at last visit was trial of size 5 ring with support pessary and vaginal estrogen .   Pending TVUS report Denies bulge symptoms, vaginal discharge, bleeding.  H/o postmenopausal slowly enlarging fibroid uterus last seen by Dr. Viktoria in 2021 Denies use of vaginal estrogen Reports concerns with vaginal estrogen use due to h/o ER+ Stage IA R breast cancer s/p letrozole, discontinued vaginal estrogen SUI 2x/day reduced in frequency and volume from 3x/day prior to pessary use Day time voids 4-5 down from 6-8.  Nocturia: 2x/night down from 3x/night to void with CPAP use for OSA  Reports feeling comfort from pessary Attributed prior discomfort from constipation Started Benefiber 1 teaspoon 1-2x/day, bowel movements with reduced straining since starting fiber.  Desires referral to GYN to establish care due to loss of gyn provider and history of LEEP.  Loss of follow-up with Dr. Viktoria for postmenopausal slowly enlarging fibroid uterus since 2021  Past Medical History: Patient  has a past medical history of Allergy, Arthritis, Cancer (HCC), Cataract, Cystocele, Fibroids, Gastric reflux, Generalized headaches, GERD (gastroesophageal reflux disease), H. pylori infection, Heart murmur, Hiatal hernia, Hypertension, Obstructive sleep apnea, Pain in limb, Sleep apnea, Thyroid  disease, Tubular adenoma of colon, and Varicose veins.   Past Surgical History: She  has a past surgical history that includes Tonsilectomy, adenoidectomy, bilateral myringotomy and tubes; Colonoscopy; Esophagogastroduodenoscopy; Breath tek h pylori (N/A, 05/14/2016); Upper gastrointestinal endoscopy; and Breast surgery (Right).   Medications: She has a current  medication list which includes the following prescription(s): ascorbic acid, vitamin b complex, cholecalciferol, estradiol , liothyronine , magnesium, meloxicam, nebivolol , omega-3 fatty acids, and synthroid .   Allergies: Patient is allergic to amlodipine , amoxicillin, pneumococcal 13-val conj vacc, prevnar 13 [pneumococcal 13-val conj vacc], tetanus-diphth-acell pertussis, codeine, and tetanus-diphth-acell pertussis.   Social History: Patient  reports that she has never smoked. She has never been exposed to tobacco smoke. She has never used smokeless tobacco. She reports that she does not drink alcohol and does not use drugs.     OBJECTIVE     Physical Exam: Vitals:   11/27/24 0804  BP: 132/80  Pulse: (!) 54   Gen: No apparent distress, A&O x 3.  Pelvic Exam: Normal external female genitalia; Bartholin's and Skene's glands normal in appearance; urethral meatus normal in appearance, no urethral masses or discharge. The pessary was noted to be in place with some white discharge. It was removed and cleaned. Speculum exam revealed no lesions in the vagina. The pessary was replaced. It was comfortable to the patient and fit well.        ASSESSMENT AND PLAN    Sherry Bell is a 79 y.o. with:  1. Fibroids   2. Pelvic organ prolapse quantification stage 3 cystocele   3. Vaginal atrophy   4. Urge urinary incontinence   5. History of loop electrosurgical excision procedure (LEEP) of cervix     Fibroids Assessment & Plan: - known postmenopausal slow growing fibroid uterus with prior evaluation by Dr. Eloy and Dr. Viktoria, last in 09/2020 - TVUS 04/18/21 showed 14.5 x 4.9 x 11.0 cm uterus with largest 8.3 x 8.8 x 8.6 cm fibroid and ES 12mm.  - offered pelvic ultrasound every 4-6 months, pelvic MRI, and endometrial biopsy - minimal mobility on exam with  fullness in posterior cul-de-sac - reviewed with Dr. Viktoria to review next steps pending TVUS to reassess fibroids, encouraged to follow-up  with Dr. Viktoria due to loss of follow-up since 2021  - discussed risk of uterine preservation and need for additional intervention   Orders: -     Ambulatory referral to Gynecology  Pelvic organ prolapse quantification stage 3 cystocele Assessment & Plan: - evaluated by Dr. Alvia and Dr. Gaston last in 2022 - prior use of size 4 and 5 ring with support pessaries with dental floss, discontinued due to vaginal odor and difficulty with self management - For treatment of pelvic organ prolapse, we discussed options for management including expectant management, conservative management, and surgical management, such as Kegels, a pessary, pelvic floor physical therapy, and specific surgical procedures. - pt desires to continue office management of size 5 ring with support pessary. Declined refitting for incontinence pessary at this time. Continue to monitor - pending TVUS results - discussed risk of change in urinary or bowel symptoms, vaginal ulceration, discharge, bleeding, fistula formation. Explained that pt may require multiple sizes and types for fitting. Return in 3 months for pessary check or sooner if clinical change - encouraged pt to purchase pessary assistance and bring to follow-up visit if she experiences difficulty with self management We discussed two options for prolapse repair:  1) vaginal repair without mesh - Pros - safer, no mesh complications - Cons - not as strong as mesh repair, higher risk of recurrence  2) laparoscopic repair with mesh - Pros - stronger, better long-term success - Cons - risks of mesh implant (erosion into vagina or bladder, adhering to the rectum, pain) - these risks are lower than with a vaginal mesh but still exist  3) Vaginal closure procedure - pros - strong, good long-term success - cons - unable to have penetrative intercourse  - desires to avoid mesh use - not sexually active - advised against uterine preservation due to history of LEEP  and leiomyoma, pending TVUS - continue pessary management at this time   Vaginal atrophy Assessment & Plan: - For symptomatic vaginal atrophy options include lubrication with a water-based lubricant, personal hygiene measures and barrier protection against wetness, and estrogen replacement in the form of vaginal cream, vaginal tablets, or a time-released vaginal ring.   - pt discontinued low dose vaginal estrogen  - We reviewed the potential risks associated with hormone replacement including stroke, heart attack, and blood clots; and the fact that these risks are very low with vaginal estrogen use due to the very low systemic absorption rate of ~ 0.01% with a twice-week regimen. Discussed lack of association with cancer recurrence in breast cancer patients.  - discussed proper vulvar care, warm compression, avoid pad use, cotton only underwear and barrier ointment if needed  - encouraged Vit E suppository, moisturizer with Replens/Revaree - encouraged low dose vaginal estrogen 0.5g twice a week if she desires to proceed   Urge urinary incontinence Assessment & Plan: - POCT UA negative, PVR 40mL We discussed the symptoms of overactive bladder (OAB), which include urinary urgency, urinary frequency, nocturia, with or without urge incontinence.  While we do not know the exact etiology of OAB, several treatment options exist. We discussed management including behavioral therapy (decreasing bladder irritants, urge suppression strategies, timed voids, bladder retraining), physical therapy, medication; for refractory cases posterior tibial nerve stimulation, sacral neuromodulation, and intravesical botulinum toxin injection.  For anticholinergic medications, we discussed the potential side effects of anticholinergics including dry  eyes, dry mouth, constipation, cognitive impairment and urinary retention. For Beta-3 agonist medication, we discussed the potential side effect of elevated blood pressure  which is more likely to occur in individuals with uncontrolled hypertension. - encouraged to reduce caffeine use due to association with urinary frequency - improved with pessary placement, declined incontinence pessary fitting at this time - will need urodynamics prior to surgery - encouraged weight reduction and monitor blood sugar control, last HbA1C 6.2 in 2023   History of loop electrosurgical excision procedure (LEEP) of cervix Assessment & Plan: - h/o LEEP in 2003 - last pap NILM 09/12/20 - advised against obliterative procedure with uterine preservation due to inability to evaluate cervix - referred to GYN to establish care per pt request      [name], MD

## 2024-11-27 NOTE — Assessment & Plan Note (Signed)
-   POCT UA negative, PVR 40mL We discussed the symptoms of overactive bladder (OAB), which include urinary urgency, urinary frequency, nocturia, with or without urge incontinence.  While we do not know the exact etiology of OAB, several treatment options exist. We discussed management including behavioral therapy (decreasing bladder irritants, urge suppression strategies, timed voids, bladder retraining), physical therapy, medication; for refractory cases posterior tibial nerve stimulation, sacral neuromodulation, and intravesical botulinum toxin injection.  For anticholinergic medications, we discussed the potential side effects of anticholinergics including dry eyes, dry mouth, constipation, cognitive impairment and urinary retention. For Beta-3 agonist medication, we discussed the potential side effect of elevated blood pressure which is more likely to occur in individuals with uncontrolled hypertension. - encouraged to reduce caffeine use due to association with urinary frequency - improved with pessary placement, declined incontinence pessary fitting at this time - will need urodynamics prior to surgery - encouraged weight reduction and monitor blood sugar control, last HbA1C 6.2 in 2023

## 2024-11-30 ENCOUNTER — Other Ambulatory Visit: Payer: Self-pay | Admitting: Obstetrics

## 2024-11-30 DIAGNOSIS — Z9889 Other specified postprocedural states: Secondary | ICD-10-CM

## 2024-11-30 DIAGNOSIS — D219 Benign neoplasm of connective and other soft tissue, unspecified: Secondary | ICD-10-CM

## 2024-11-30 NOTE — Progress Notes (Signed)
 Good afternoon Dr Guadlupe,    Yes, please we would need a new referral since it has been more than 3 years.   Thanks,   [employer], [address]  [place], [place]   [address] [phone]

## 2024-11-30 NOTE — Progress Notes (Signed)
 Referral placed to re-establish care with Dr. Viktoria

## 2024-12-01 ENCOUNTER — Other Ambulatory Visit (HOSPITAL_COMMUNITY): Payer: Self-pay

## 2024-12-01 ENCOUNTER — Telehealth: Payer: Self-pay

## 2024-12-01 ENCOUNTER — Encounter: Payer: Self-pay | Admitting: Cardiology

## 2024-12-01 ENCOUNTER — Ambulatory Visit: Attending: Cardiology | Admitting: Cardiology

## 2024-12-01 VITALS — BP 150/80 | HR 59 | Ht 62.0 in | Wt 231.0 lb

## 2024-12-01 DIAGNOSIS — G4733 Obstructive sleep apnea (adult) (pediatric): Secondary | ICD-10-CM | POA: Diagnosis not present

## 2024-12-01 DIAGNOSIS — R0609 Other forms of dyspnea: Secondary | ICD-10-CM | POA: Diagnosis not present

## 2024-12-01 DIAGNOSIS — I10 Essential (primary) hypertension: Secondary | ICD-10-CM | POA: Diagnosis not present

## 2024-12-01 DIAGNOSIS — R002 Palpitations: Secondary | ICD-10-CM | POA: Diagnosis not present

## 2024-12-01 MED ORDER — OLMESARTAN MEDOXOMIL-HCTZ 20-12.5 MG PO TABS
1.0000 | ORAL_TABLET | ORAL | 2 refills | Status: DC
  Filled 2024-12-01: qty 30, 30d supply, fill #0
  Filled 2024-12-21 – 2024-12-24 (×2): qty 30, 30d supply, fill #1

## 2024-12-01 NOTE — Patient Instructions (Addendum)
 Medication Instructions:  START   olmesartan -hydrochlorothiazide  (BENICAR  HCT) 20-12.5 MG tablet         Take 1 tablet by mouth every morning   *If you need a refill on your cardiac medications before your next appointment, please call your pharmacy*  Lab Work: BMP ( 2 weeks) If you have labs (blood work) drawn today and your tests are completely normal, you will receive your results only by: MyChart Message (if you have MyChart) OR A paper copy in the mail If you have any lab test that is abnormal or we need to change your treatment, we will call you to review the results.  Testing/Procedures: ECHOCARDIOGRAM   Your physician has requested that you have an echocardiogram. Echocardiography is a painless test that uses sound waves to create images of your heart. It provides your doctor with information about the size and shape of your heart and how well your hearts chambers and valves are working. This procedure takes approximately one hour. There are no restrictions for this procedure. Please do NOT wear cologne, perfume, aftershave, or lotions (deodorant is allowed). Please arrive 15 minutes prior to your appointment time.  Please note: We ask at that you not bring children with you during ultrasound (echo/ vascular) testing. Due to room size and safety concerns, children are not allowed in the ultrasound rooms during exams. Our front office staff cannot provide observation of children in our lobby area while testing is being conducted. An adult accompanying a patient to their appointment will only be allowed in the ultrasound room at the discretion of the ultrasound technician under special circumstances. We apologize for any inconvenience.   MYOCARDIAL PERFUSION IMAGING   You are scheduled for a Myocardial Perfusion Imaging Study on:  _______________ at  _________  Please arrive 15 minutes prior to your appointment time for registration and insurance purposes.  The test will take  approximately 3 to 4 hours to complete; you may bring reading material.  If someone comes with you to your appointment, they will need to remain in the main lobby due to limited space in the testing area. **If you are pregnant or breastfeeding, please notify the nuclear lab prior to your appointment**  How to prepare for your Myocardial Perfusion Test: Do not eat or drink 3 hours prior to your test, except you may have water. Do not consume products containing caffeine (regular or decaffeinated) 12 hours prior to your test. (ex: coffee, chocolate, sodas, tea). Do bring a list of your current medications with you.  If not listed below, you may take your medications as normal. Do not take nebivolol  (Bystolic ) for 24 hours prior to the test.  Bring the medication to your appointment as you may be required to take it once the test is complete. Do wear comfortable clothes (no dresses or overalls) and walking shoes, tennis shoes preferred (No heels or open toe shoes are allowed). Do NOT wear cologne, perfume, aftershave, or lotions (deodorant is allowed). If these instructions are not followed, your test will have to be rescheduled.  Please report to [address] ([facility] 2nd Floor, for your test.  If you have questions or concerns about your appointment, you can call the Nuclear Lab at [phone].  If you cannot keep your appointment, please provide 24 hours notification to the Nuclear Lab, to avoid a possible $50 charge to your account.  Follow-Up: At [facility], you and your health needs are  our priority.  As part of our continuing mission to provide you with exceptional heart care, our providers are all part of one team.  This team includes your primary Cardiologist (physician) and Advanced Practice Providers or APPs (Physician Assistants and Nurse Practitioners) who all work together to provide you with the care you need, when you  need it.  Your next appointment:   2 month(s)  Provider:   Gordy Bergamo, MD           We recommend signing up for the patient portal called MyChart.  Patients are able to view lab/test results, encounter notes, upcoming appointments, etc.  Non-urgent messages can be sent to your provider as well, go to [email or web address].   Hold Bystolic  one day before the stress test

## 2024-12-01 NOTE — Telephone Encounter (Signed)
 I reached out to Ms. Trulson and left a message for her to call our office in regards to a new patient referral our office received from Dr.Yuen.  Appointment available with Dr.Tucker on 2/6

## 2024-12-01 NOTE — Progress Notes (Signed)
 " Cardiology Office Note:  .   Date:  12/01/2024  ID:  SUHEY RADFORD, DOB July 24, 1946, MRN [id number] PCP: Delayne Artist PARAS, MD  [facility] Providers Cardiologist:  Gordy Bergamo, MD   History of Present Illness: Sherry   BERNICE Bell is a 79 y.o. Caucasian female patient with history of HTN, very mild HLD with elevated LDL particle number, obesity, and pre-diabetes, obstructive sleep apnea and has been using CPAP.  Activity limited by bilateral degenerative knee disease.  She complains of worsening dyspnea on exertion over the last few weeks and made an appointment to see me.  No PND or orthopnea.  Denies chest pain.    Discussed the use of AI scribe software for clinical note transcription with the patient, who gave verbal consent to proceed.  History of Present Illness Sherry Bell is a 79 year old female with hypertension who presents with shortness of breath on exertion.  She reports new exertional shortness of breath when walking distances that now limits her pace and activity. She denies leg cramps with walking but feels she cannot get enough air.  She has leg cramps mainly at night involving her legs, feet, and sometimes thighs that can wake her. During the day, cramps occur if she sits too long or in a high chair, affecting her feet and ankles.  Her blood pressure has been consistently elevated, including today. She uses CPAP at night and wakes two to three times to urinate. She denies waking up short of breath.  She takes Bystolic  at night for blood pressure. She stopped spironolactone  several months ago in fact was not on spironolactone  when I last saw her 6 months ago, restarted this because of dyspnea but again stopped two days ago because she thought it was causing problems and asks if she should restart it.  Cardiac Studies relevent.    ECHOCARDIOGRAM COMPLETE 08/02/2021 Normal LV systolic function with visual EF 60-65%. Left ventricle cavity is normal in size. Mild left  ventricular hypertrophy. Normal global wall motion. Normal diastolic filling pattern, normal LAP. Mild (Grade I) mitral regurgitation. Mild tricuspid regurgitation. Mild pulmonary hypertension. RVSP measures 36 mmHg. IVC is dilated with a respiratory response of <50%, estimated RAP . Compared to study 04/09/2017: G2DD and elevated LAP is now normal, PHTN is new finding, otherwise no significant change.  EKG:  CERMSGREFRESH([id number],,,1)@  Labs   Care everywhere/Faxed External Labs:  Labs 06/23/2024:   Total cholesterol 174, triglycerides 123, HDL 54, LDL 98.   BNP: 111.4   BUN 24, creatinine 0.92, potassium 4.8, LFTs normal.   Hb 13.0/HCT 39.3, platelets 228.   A1c 5.6%.  TSH normal at 0.25.  ROS  Review of Systems  Cardiovascular:  Positive for dyspnea on exertion. Negative for chest pain and leg swelling.  Musculoskeletal:  Positive for muscle cramps (leg cramps at rest occasionally).   Physical Exam:   VS:  BP (!) 150/80 (BP Location: Left Arm, Patient Position: Sitting, Cuff Size: Large)   Pulse (!) 59   Ht 5' 2 (1.575 m)   Wt 231 lb (104.8 kg)   SpO2 96%   BMI 42.25 kg/m    Wt Readings from Last 3 Encounters:  12/01/24 231 lb (104.8 kg)  11/10/24 229 lb (103.9 kg)  07/16/24 222 lb (100.7 kg)    BP Readings from Last 3 Encounters:  12/01/24 (!) 150/80  11/27/24 132/80  11/16/24 (!) 167/76   Physical Exam Constitutional:      Appearance: She is  morbidly obese.  Neck:     Vascular: No carotid bruit or JVD.  Cardiovascular:     Rate and Rhythm: Normal rate and regular rhythm.     Pulses: Intact distal pulses.     Heart sounds: Normal heart sounds. No murmur heard.    No gallop.  Pulmonary:     Effort: Pulmonary effort is normal.     Breath sounds: Normal breath sounds.  Abdominal:     General: Bowel sounds are normal.     Palpations: Abdomen is soft.  Musculoskeletal:     Right lower leg: No edema.     Left lower leg: No edema.     ASSESSMENT AND PLAN: .      ICD-10-CM   1. Dyspnea on exertion  R06.09     2. Primary hypertension  I10     3. OSA on CPAP  G47.33     4. Palpitations  R00.2      Assessment & Plan Dyspnea on exertion Reports shortness of breath when walking a distance and feeling tired easily. No leg cramps during walking. Circulation is good with bounding pulses in the legs, and no blockage in the neck arteries. - Ordered chemical stress test to assess cardiac function, patient unable to do treadmill due to degenerative joint disease. - Ordered echocardiogram to evaluate heart structure and function  Primary hypertension Blood pressure is not well controlled, with recent readings being high. This may contribute to dyspnea on exertion. Current medication includes nebivolol  (Bystolic ). - Prescribed olmesartan  HCT 20/12.5 mg to be taken in the morning - Instructed to continue nebivolol  (Bystolic ) in the evening - Advised not to take Bystolic  one day before the stress test - Ordered BMP in two weeks to monitor response to new medication  Obstructive sleep apnea on CPAP Continues to use CPAP and reports no waking up in the middle of the night feeling huffing and puffing.  Peripheral neuropathy Experiences leg cramps, particularly at night and when sitting for long periods. Circulation is good, and cramps are not due to circulation issues. Neuropathy suspected. - Recommended chewable B12 supplement, 1000 micrograms  Follow up: 2 months Dyspnea, Hypertension, F/U BMP. Stress and echo   Signed,  Gordy Bergamo, MD, [facility] 12/01/2024, 10:01 AM [facility] [address] [place], [place] [address] Phone: [phone]. Fax:  [phone]  "

## 2024-12-01 NOTE — Telephone Encounter (Signed)
 Spoke with Sherry Bell regarding her referral to GYN oncology. She has an appointment scheduled with Dr. Viktoria on 2/6 at 9:00. Patient agrees to date and time. She has been provided with office address and location. She is also aware of our mask and visitor policy. Patient verbalized understanding and will call with any questions.

## 2024-12-07 ENCOUNTER — Telehealth (HOSPITAL_COMMUNITY): Payer: Self-pay

## 2024-12-07 NOTE — Telephone Encounter (Signed)
 Spoke with the patient, she wasn't sure when her appointment was and the instructions. All information given and understood. S.Shalamar Crays CCT

## 2024-12-15 ENCOUNTER — Telehealth: Payer: Self-pay

## 2024-12-15 LAB — BASIC METABOLIC PANEL WITH GFR
BUN/Creatinine Ratio: 19 (ref 12–28)
BUN: 21 mg/dL (ref 8–27)
CO2: 26 mmol/L (ref 20–29)
Calcium: 9.4 mg/dL (ref 8.7–10.3)
Chloride: 101 mmol/L (ref 96–106)
Creatinine, Ser: 1.08 mg/dL — ABNORMAL HIGH (ref 0.57–1.00)
Glucose: 100 mg/dL — ABNORMAL HIGH (ref 70–99)
Potassium: 5.2 mmol/L (ref 3.5–5.2)
Sodium: 139 mmol/L (ref 134–144)
eGFR: 53 mL/min/1.73 — ABNORMAL LOW

## 2024-12-15 NOTE — Telephone Encounter (Signed)
 Patient states her pessary is flipping. She can remove it, but desires to keep it in place so you can see her. She desires an appointment tomorrow as she has been made aware you have surgery today. I offered Thursday at 1240pm and this will not work for her, shes working [employer] and Friday. Would you like to work her in tomorrow?

## 2024-12-16 ENCOUNTER — Ambulatory Visit: Payer: Self-pay | Admitting: Cardiology

## 2024-12-16 DIAGNOSIS — I10 Essential (primary) hypertension: Secondary | ICD-10-CM

## 2024-12-16 NOTE — Progress Notes (Signed)
"    ICD-10-CM   1. Essential (primary) hypertension  I10 Basic metabolic panel with GFR      "

## 2024-12-17 NOTE — Telephone Encounter (Signed)
 Patient was offered appointment on 12-18-2024. She declined. Requested Monday next week. Advised due to weather we would probably not be in clinic. Asks we call her when were back to schedule a visit. She wants to buy a pessary remover from online to see if she can remove until then.

## 2024-12-21 ENCOUNTER — Other Ambulatory Visit (HOSPITAL_COMMUNITY): Payer: Self-pay

## 2024-12-22 NOTE — Telephone Encounter (Signed)
 12-28-24 follow u has been booked

## 2024-12-28 ENCOUNTER — Ambulatory Visit: Admitting: Obstetrics

## 2024-12-29 ENCOUNTER — Other Ambulatory Visit: Payer: Self-pay | Admitting: Cardiology

## 2024-12-29 ENCOUNTER — Other Ambulatory Visit: Payer: Self-pay

## 2024-12-29 ENCOUNTER — Encounter: Payer: Self-pay | Admitting: Obstetrics

## 2024-12-29 ENCOUNTER — Ambulatory Visit: Admitting: Obstetrics

## 2024-12-29 ENCOUNTER — Other Ambulatory Visit (HOSPITAL_COMMUNITY): Payer: Self-pay

## 2024-12-29 VITALS — BP 126/53 | HR 61

## 2024-12-29 DIAGNOSIS — N8110 Cystocele, unspecified: Secondary | ICD-10-CM | POA: Diagnosis not present

## 2024-12-29 DIAGNOSIS — I10 Essential (primary) hypertension: Secondary | ICD-10-CM

## 2024-12-29 DIAGNOSIS — N952 Postmenopausal atrophic vaginitis: Secondary | ICD-10-CM

## 2024-12-29 DIAGNOSIS — Z4689 Encounter for fitting and adjustment of other specified devices: Secondary | ICD-10-CM | POA: Diagnosis not present

## 2024-12-29 MED ORDER — OLMESARTAN MEDOXOMIL-HCTZ 20-12.5 MG PO TABS
1.0000 | ORAL_TABLET | ORAL | 0 refills | Status: AC
  Filled 2024-12-29: qty 90, 90d supply, fill #0

## 2024-12-29 NOTE — Assessment & Plan Note (Addendum)
-   evaluated by Dr. Alvia and Dr. Gaston last in 2022 - prior use of size 4 and 5 ring with support pessaries with dental floss, discontinued due to vaginal odor and difficulty with self management - For treatment of pelvic organ prolapse, we discussed options for management including expectant management, conservative management, and surgical management, such as Kegels, a pessary, pelvic floor physical therapy, and specific surgical procedures. - pt desires to continue office management of pessary, trial of size 6 ring with support pessary. Declined refitting for incontinence pessary at this time due to resolution of urinary symptoms. Continue to monitor - TVUS 11/16/24 with 16.1 x 10.0 x 13.3 cm uterus and 8.6 x 8.4 x 8.6 cm fibroid. Pending follow-up with Dr. Viktoria - desires to avoid mesh use and not sexually active - advised against uterine preservation due to history of LEEP and leiomyoma, pending TVUS - continue pessary management at this time

## 2024-12-29 NOTE — Assessment & Plan Note (Addendum)
-   size 5 ring with support pessary with improved symptoms, however reports bulge 1 inch past vaginal opening - trial of size 6 ring with support pessary - discussed risk of change in urinary or bowel symptoms, vaginal ulceration, discharge, bleeding, fistula formation. Explained that pt may require multiple sizes and types for fitting.  - encouraged pt to purchase pessary assistance and bring to follow-up visit if she experiences difficulty with self management

## 2024-12-29 NOTE — Assessment & Plan Note (Signed)
-   For symptomatic vaginal atrophy options include lubrication with a water-based lubricant, personal hygiene measures and barrier protection against wetness, and estrogen replacement in the form of vaginal cream, vaginal tablets, or a time-released vaginal ring.   - We reviewed the potential risks associated with hormone replacement including stroke, heart attack, and blood clots; and the fact that these risks are very low with vaginal estrogen use due to the very low systemic absorption rate of ~ 0.01% with a twice-week regimen. Discussed lack of association with cancer recurrence in breast cancer patients.  - discussed proper vulvar care, warm compression, avoid pad use, cotton only underwear and barrier ointment if needed  - encouraged Vit E suppository, moisturizer with Replens/Revaree - continue low dose vaginal estrogen 0.5g twice a week

## 2024-12-29 NOTE — Patient Instructions (Addendum)
 You are opting to try a size 6 ring with support pessary. This will keep the bulge inside and prevent it from getting worse. You can learn how to remove it yourself or we can do that for you every 3 months.   Please return if you experience any change in urinary or vaginal symptoms.  Continue vaginal estrogen 1g twice a week.   Continue fiber supplementation to optimize stool consistency.

## 2024-12-30 ENCOUNTER — Encounter (HOSPITAL_COMMUNITY): Payer: Self-pay | Admitting: *Deleted

## 2024-12-30 ENCOUNTER — Encounter: Payer: Self-pay | Admitting: Gynecologic Oncology

## 2024-12-31 ENCOUNTER — Other Ambulatory Visit: Payer: Self-pay | Admitting: Cardiology

## 2024-12-31 DIAGNOSIS — R0609 Other forms of dyspnea: Secondary | ICD-10-CM

## 2024-12-31 NOTE — Progress Notes (Unsigned)
 GYNECOLOGIC ONCOLOGY NEW PATIENT CONSULTATION   Patient Name: Sherry Bell  Patient Age: 79 y.o. Date of Service: 01/01/25 Referring Provider: Dr. Lianne Gillis  Primary Care Provider: Delayne Artist PARAS, MD Consulting Provider: Comer Dollar, MD   Assessment/Plan:  Postmenopausal patient with known enlarged uterus and uterine fibroids.  Patient is overall doing well, relatively asymptomatic.  We reviewed her most recent pelvic ultrasound which shows small increase in the size of her uterus overall but stable size of the largest fibroid.  We discussed again the typical shrinkage that we see after menopause in uterine fibroids.  In discussion with the patient today, she is unaware of any procedures on her cervix and denies any history of abnormal Pap smears.  She would like to establish with an OB/GYN as hers retired.  She prefers to do this in the same building as Dr. Gillis.  He was given the contact information for [facility].  In terms of management moving forward, we discussed multiple options.  Given the size and slow but continued growth of her uterus, I would favor moving forward with definitive surgery with a total hysterectomy.  We discussed that this would be a total hysterectomy with BSO given her age.  If we proceed with definitive surgery, I would encourage doing this in conjunction with Dr. Gillis so that any procedures that might be beneficial from a pelvic organ prolapse/urinary standpoint could be performed at the same time.  If the patient continues to decline or prefer not to proceed with definitive surgery, we discussed the option of continued close imaging surveillance.  This could be with a repeat pelvic ultrasound in 1 year.  Alternatively, we could plan on getting a pelvic MRI now.  This would be beneficial for several reasons.  It would give us  a more in-depth view of the uterus and her fibroids to ensure no imaging features that raise significant concern  for malignancy.  Additionally, it would help rule out any evidence of metastatic disease.  Patient asked about embolization to shrink the fibroid.  Discussed that given the size of her uterus as well as growth (and underlying concern although low for a malignancy such as a sarcoma), I would not typically recommend a uterine artery embolization.  I discussed some of the risks with this procedure.  If the patient felt strongly about learning more, we discussed having her meet with interventional radiology.  The patient was somewhat overwhelmed by this discussion.  I wrote out all of these options in her AVS and we discussed having a phone visit in 2 months to check-in again if she is not ready to make a decision before then.  Also strongly encouraged her to call or reach out by MyChart with any questions as they come up.  A copy of this note was sent to the patient's referring provider.   70 minutes of total time was spent for this patient encounter, including preparation, face-to-face counseling with the patient and coordination of care, and documentation of the encounter.  Comer Dollar, MD  Division of Gynecologic Oncology  Department of Obstetrics and Gynecology  [facility]  ___________________________________________  Chief Complaint: Chief Complaint  Patient presents with   Fibroids    History of Present Illness:  Sherry Bell is a 79 y.o. y.o. female who is seen in consultation at the request of Dr. Gillis for an evaluation of a fibroid uterus.   Patient has a known history of a fibroid  uterus, dating back to 2005.  Prior imaging is noted below.  She was previously referred to my partner in 2015 given some growth of her fibroids and menopause and at that time recommendation was made for close imaging surveillance.  She was recently seen by her GYN for vulvar pruritus.  She was treated for a yeast infection with her symptoms now resolved, and underwent  pelvic imaging for the first time since 2015.  Her most recent ultrasound earlier this month showed that her uterus had enlarged some since 2015 that seems to be related to several centimeter enlargement of her dominant fibroid.   She had been treated previously for pelvic organ prolapse (most recently saw Dr. Dorothyann Ku in 2016) and has performed Kegel exercises in the past as well as used a pessary.  She ultimately discontinued the pessary as she felt unable to reach down to remove and clean it.  I initially met the patient in late 2021.  At that time, we discussed surgical and nonsurgical options.  Given significant pelvic organ prolapse, decision was made to have this evaluated before moving forward with any definitive surgery.  Ultimately, she decided against surgery.  We repeated imaging with a pelvic ultrasound in 03/2021 at which time her uterus measured 14.5 x 14.9 x 11 cm.  Largest mid uterine fibroid measured 8.3 x 8.8 x 8.6 cm.  Endometrium was 12 mm.  Neither ovary visualized.  The patient was then lost to follow-up with me.  She recently established with Dr. Guadlupe.  Pelvic ultrasound on 11/16/2024 revealed a uterus measuring 16.1 x 10 x 13.3 cm.  Within the central uterus, largest fibroid measures 8.6 x 8.4 x 8.6 cm.  Endometrium not well-visualized.  Neither ovary visualized.  She opted for pessary for treatment of her pelvic organ prolapse.  Today, the patient tells me she is doing well.  She has had significant improvement in her prolapse and urinary symptoms with the use of a pessary.  She denies any abdominal or pelvic pain.  Denies any vaginal bleeding since I last saw her.  She notes normal bowel function.  Endorses a good appetite.  Weight has been up and down but no significant changes either way.  Denies any nausea or emesis.   Prior Imaging Pelvic ultrasound in 09/2020 at [facility]: Uterus measures 14.4 x 11.6 x 9.5 cm. Endometrial lining not able to be adequately  seen. Multiple fibroids noted including a 3 x 2 x 3.6 right lateral fundal fibroid, a 2.8 x 2.3 x 2.8 cm right lateral fundal fibroid, an 8 x 8 x 7.5 cm midline fibroid, and a 4.3 x 3.3 x 3.5 cm posterior right fibroid.   Pelvic ultrasound in 04/2014 at [facility]: Uterus measures 11.8 x 10.6 x 7.5 cm, endometrium is poorly visualized. In the lower uterine segment portion, there is a small amount of fluid. 3 largest fibroids measured are a posterior left fibroid measuring 6.3 x 4.7 x 5.4 cm, and anterior fibroid measuring 4 x 3 x 3.8 cm, and a posterior fibroid measuring 4 x 3 x 2.9 cm.   CT urogram 02/2014: Uterus measures 11.1 x 8.9 cm with scattered punctate calcifications noted within. There are no definitive uterine fibroids. No definitive fluid seen within the canal. No discrete adnexal lesions. No free fluid in the pelvis.   Pelvic ultrasound at [facility] in 04/2004: Uterus measures 10.2 x 8.7 x 7.1 cm. Multiple fibroids noted, the largest measuring 4.2 x 4.4 x 4.5 cm.  PAST MEDICAL HISTORY:  Past Medical History:  Diagnosis Date   Allergy    Arthritis    Cancer (HCC)    BREAST RIGHT   Cataract    Cystocele    Fibroids    Gastric reflux    Generalized headaches    GERD (gastroesophageal reflux disease)    H. pylori infection    8 years ago   Heart murmur    Mild mitral regurgitation- pt denies this 07-02-16 at her [facility]   Hiatal hernia    Hypertension    Obstructive sleep apnea    uses CPAP   Pain in limb    Sleep apnea    uses CPAP   Thyroid  disease    Tubular adenoma of colon    Varicose veins      PAST SURGICAL HISTORY:  Past Surgical History:  Procedure Laterality Date   BREAST SURGERY Right    SEPTEMBER 12 TH 2023   BREATH TEK H PYLORI N/A 05/14/2016   Procedure: BREATH SHIRLEAN VEAR LORA;  Surgeon: Gordy CHRISTELLA Starch, MD;  Location: [facility];  Service: Gastroenterology;  Laterality: N/A;   COLONOSCOPY     Dr Geroge   ESOPHAGOGASTRODUODENOSCOPY     Dr Geroge    TONSILECTOMY, ADENOIDECTOMY, BILATERAL MYRINGOTOMY AND TUBES     UPPER GASTROINTESTINAL ENDOSCOPY      OB/GYN HISTORY:  OB History  Gravida Para Term Preterm AB Living  2 2 1   0 1  SAB IAB Ectopic Multiple Live Births  0    1    # Outcome Date GA Lbr Len/2nd Weight Sex Type Anes PTL Lv  2 Term     M Vag-Spont   LIV  1 Para             No LMP recorded. Patient is postmenopausal.  Age at menarche: 89 Age at menopause: 87 Hx of HRT: Denies Hx of STDs: No Last pap: 08/2020 - negative Hx of abnormal pap smears: Denies  SCREENING STUDIES:  Last mammogram: 2023  Last colonoscopy: 2024 Last bone mineral density: 2012  MEDICATIONS: Outpatient Encounter Medications as of 01/01/2025  Medication Sig   Ascorbic Acid (VITAMIN C PO) Take 400 mg by mouth daily.   B Complex Vitamins (VITAMIN B COMPLEX) CAPS Take by mouth. TAKE ONE DAILY   cholecalciferol (VITAMIN D) 1000 UNITS tablet Take 7,000 Units by mouth daily.    estradiol  (ESTRACE ) 0.01 % CREA vaginal cream Place 0.5g nightly for two weeks then twice a week after   liothyronine  (CYTOMEL ) 5 MCG tablet TAKE 2 TABLETS BY MOUTH DAILY   Magnesium 300 MG CAPS Take 1 capsule by mouth daily. Reported on 02/14/2016,TAKING 500 MG DAILY   meloxicam (MOBIC) 15 MG tablet Take 15 mg by mouth daily as needed.    nebivolol  (BYSTOLIC ) 10 MG tablet Take 1 tablet (10 mg total) by mouth daily.   olmesartan -hydrochlorothiazide  (BENICAR  HCT) 20-12.5 MG tablet Take 1 tablet by mouth every morning.   Omega-3 Fatty Acids (FISH OIL PO) Take by mouth. TAKE 2 DAILY   SYNTHROID  125 MCG tablet TAKE 1 TABLET BY MOUTH EVERY DAY BEFORE BREAKFAST   [DISCONTINUED] olmesartan -hydrochlorothiazide  (BENICAR  HCT) 20-12.5 MG tablet Take 1 tablet by mouth every morning.   No facility-administered encounter medications on file as of 01/01/2025.    ALLERGIES:  Allergies[1]   FAMILY HISTORY:  Family History  Problem Relation Age of Onset   Kidney disease Mother     Throat cancer Father  worked at [employer]   Hypothyroidism Sister    Atrial fibrillation Sister 71   Heart failure Brother    Hypertension Brother    Heart failure Brother    Hypertension Brother    Colon cancer Neg Hx    Esophageal cancer Neg Hx    Rectal cancer Neg Hx    Stomach cancer Neg Hx    Colon polyps Neg Hx    Uterine cancer Neg Hx    Ovarian cancer Neg Hx    Crohn's disease Neg Hx    Ulcerative colitis Neg Hx    Bladder Cancer Neg Hx    Renal cancer Neg Hx      SOCIAL HISTORY:  Social Connections: Not on file    REVIEW OF SYSTEMS:  Denies appetite changes, fevers, chills, fatigue, unexplained weight changes. Denies hearing loss, neck lumps or masses, mouth sores, ringing in ears or voice changes. Denies cough or wheezing.  Denies shortness of breath. Denies chest pain or palpitations. Denies leg swelling. Denies abdominal distention, pain, blood in stools, constipation, diarrhea, nausea, vomiting, or early satiety. Denies pain with intercourse, dysuria, frequency, hematuria or incontinence. Denies hot flashes, pelvic pain, vaginal bleeding or vaginal discharge.   Denies joint pain, back pain or muscle pain/cramps. Denies itching, rash, or wounds. Denies dizziness, headaches, numbness or seizures. Denies swollen lymph nodes or glands, denies easy bruising or bleeding. Denies anxiety, depression, confusion, or decreased concentration.  Physical Exam:  Vital Signs for this encounter:  Blood pressure (!) 132/53, pulse 60, temperature 98.2 F (36.8 C), temperature source Oral, resp. rate 17, weight 232 lb (105.2 kg), SpO2 100%. Body mass index is 42.43 kg/m. General: Alert, oriented, no acute distress.  HEENT: Normocephalic, atraumatic. Sclera anicteric.  Chest: Clear to auscultation bilaterally. No wheezes, rhonchi, or rales. Cardiovascular: Regular rate and rhythm, no murmurs, rubs, or gallops.  Abdomen: Obese. Normoactive bowel sounds. Soft,  nondistended, nontender to palpation. No masses or hepatosplenomegaly appreciated. No palpable fluid wave.  Extremities: Grossly normal range of motion. Warm, well perfused. No edema bilaterally.  Significant bilateral varicose veins in her lower leg noted. Skin: No rashes or lesions.  Lymphatics: No inguinal adenopathy.  GU:      Normal external female genitalia. No lesions. No discharge or bleeding.  Pessary removed prior to her exam.             Bladder/urethra:  No lesions or masses.             Vagina: Mildly atrophic vaginal mucosa.  No lesions             Cervix: Normal appearing, no lesions.             Uterus: Approximately 14-16 cm, mobile, no parametrial involvement or nodularity.  There is mobility from both sidewalls although the uterus itself is quite heavy.             Adnexa: No masses appreciated.  LABORATORY AND RADIOLOGIC DATA:  Outside medical records were reviewed to synthesize the above history, along with the history and physical obtained during the visit.   Lab Results  Component Value Date   WBC 7.3 08/15/2021   HGB 13.1 08/15/2021   HCT 39.7 08/15/2021   PLT 210.0 08/15/2021   GLUCOSE 100 (H) 12/15/2024   CHOL 170 10/05/2020   TRIG 109 10/05/2020   HDL 50 10/05/2020   LDLCALC 101 10/05/2020   ALT 21 07/27/2018   AST 24 07/27/2018   NA 139 12/15/2024  K 5.2 12/15/2024   CL 101 12/15/2024   CREATININE 1.08 (H) 12/15/2024   BUN 21 12/15/2024   CO2 26 12/15/2024   TSH 0.34 (L) 05/01/2023   HGBA1C 6.2 10/29/2022       [1]  Allergies Allergen Reactions   Codeine Nausea And Vomiting, Nausea Only and Rash   Amlodipine  Other (See Comments)    Hair loss. Heart burn   Pneumococcal 13-Val Conj Vacc Other (See Comments)   Prevnar 13 [Pneumococcal 13-Val Conj Vacc] Other (See Comments)   Tetanus-Diphth-Acell Pertussis Other (See Comments)   Amoxicillin Nausea And Vomiting    Unknown   Tetanus-Diphth-Acell Pertussis Palpitations

## 2025-01-01 ENCOUNTER — Inpatient Hospital Stay: Admitting: Gynecologic Oncology

## 2025-01-01 ENCOUNTER — Encounter: Payer: Self-pay | Admitting: Gynecologic Oncology

## 2025-01-01 VITALS — BP 132/53 | HR 60 | Temp 98.2°F | Resp 17 | Wt 232.0 lb

## 2025-01-01 DIAGNOSIS — D219 Benign neoplasm of connective and other soft tissue, unspecified: Secondary | ICD-10-CM

## 2025-01-01 DIAGNOSIS — Z6841 Body Mass Index (BMI) 40.0 and over, adult: Secondary | ICD-10-CM

## 2025-01-01 DIAGNOSIS — N8110 Cystocele, unspecified: Secondary | ICD-10-CM

## 2025-01-01 DIAGNOSIS — N952 Postmenopausal atrophic vaginitis: Secondary | ICD-10-CM

## 2025-01-01 NOTE — Patient Instructions (Addendum)
 It was good to see you today.  Today we discussed your recent ultrasound, which shows stable size of the largest fibroid that we had been following previously.  This is compared to your ultrasound in 03/2021, so nearly 4 years ago.  The size of your uterus overall has increased slightly in that time.  I am thrilled to hear that you are bladder symptoms are improved with the pessary.  We talked about multiple options today as outlined below:  #1.  Proceed with surgery.  This would be a total hysterectomy, meaning removal of your uterus and cervix.  Given your age, I would also recommend that your tubes and ovaries be removed at the same time.  If we are moving forward with surgery, we would do this in conjunction with Dr. Guadlupe so that if you would benefit from any procedures in terms of your prolapse, they could be done at the same time.  #2. If your preference is still to avoid surgery, we could have you meet with our radiologist to discuss uterine artery embolization.  This is a procedure where they block the blood supply to the uterus.  I am a little bit concerned about side effects related to this procedure for you which could include pain or quick death of the largest fibroid.    #3.  If you would like to avoid surgery now, we could plan to get a repeat ultrasound in 12 months to assure that there is no significant change in size or the way that your fibroids look.  #4.  If we wanted to do something now that helped us  make a decision about surgery versus close follow-up, I would recommend a pelvic MRI.  This is the best imaging that we can get of the pelvis and pelvic organs.  It could look at whether there are any features of the largest fibroid or the other fibroids that would raise concern for precancer or cancer.  We will plan to have a phone visit follow-up in a couple of months.  If you have any questions that come up or would like to discuss these options sooner, please send a MyChart message  or call the office at [phone].  The name of the gynecology office in the same building of Dr. Guadlupe is [facility].

## 2025-01-04 ENCOUNTER — Inpatient Hospital Stay (HOSPITAL_COMMUNITY): Admission: RE | Admit: 2025-01-04 | Source: Ambulatory Visit

## 2025-01-04 ENCOUNTER — Ambulatory Visit (HOSPITAL_COMMUNITY)

## 2025-01-11 ENCOUNTER — Ambulatory Visit: Admitting: Physical Therapy

## 2025-01-28 ENCOUNTER — Ambulatory Visit: Admitting: Obstetrics

## 2025-02-08 ENCOUNTER — Ambulatory Visit: Admitting: Cardiology

## 2025-02-23 ENCOUNTER — Ambulatory Visit: Admitting: Obstetrics

## 2025-02-25 ENCOUNTER — Ambulatory Visit: Admitting: Obstetrics

## 2025-04-07 ENCOUNTER — Inpatient Hospital Stay: Admitting: Gynecologic Oncology

## 2025-04-09 ENCOUNTER — Inpatient Hospital Stay: Admitting: Gynecologic Oncology
